# Patient Record
Sex: Female | Born: 1945 | Race: Black or African American | Hispanic: No | Smoking: Never smoker
Health system: Southern US, Community
[De-identification: ages and names within clinical notes are randomized; demographics above are authoritative.]

## PROBLEM LIST (undated history)

## (undated) DIAGNOSIS — I10 Essential (primary) hypertension: Secondary | ICD-10-CM

## (undated) DIAGNOSIS — D649 Anemia, unspecified: Secondary | ICD-10-CM

## (undated) DIAGNOSIS — I4891 Unspecified atrial fibrillation: Secondary | ICD-10-CM

## (undated) DIAGNOSIS — J45909 Unspecified asthma, uncomplicated: Secondary | ICD-10-CM

## (undated) DIAGNOSIS — R519 Headache, unspecified: Secondary | ICD-10-CM

## (undated) DIAGNOSIS — K219 Gastro-esophageal reflux disease without esophagitis: Secondary | ICD-10-CM

## (undated) DIAGNOSIS — E7800 Pure hypercholesterolemia, unspecified: Secondary | ICD-10-CM

## (undated) DIAGNOSIS — M1990 Unspecified osteoarthritis, unspecified site: Secondary | ICD-10-CM

## (undated) HISTORY — DX: Unspecified asthma, uncomplicated: J45.909

## (undated) HISTORY — DX: Headache, unspecified: R51.9

---

## 1999-07-13 ENCOUNTER — Ambulatory Visit (HOSPITAL_COMMUNITY): Admission: RE | Admit: 1999-07-13 | Discharge: 1999-07-13 | Payer: Self-pay | Admitting: Family Medicine

## 1999-07-13 ENCOUNTER — Encounter: Payer: Self-pay | Admitting: Family Medicine

## 1999-07-25 ENCOUNTER — Ambulatory Visit (HOSPITAL_COMMUNITY): Admission: RE | Admit: 1999-07-25 | Discharge: 1999-07-25 | Payer: Self-pay

## 2000-06-02 ENCOUNTER — Emergency Department (HOSPITAL_COMMUNITY): Admission: EM | Admit: 2000-06-02 | Discharge: 2000-06-02 | Payer: Self-pay | Admitting: Emergency Medicine

## 2000-06-02 ENCOUNTER — Encounter: Payer: Self-pay | Admitting: Emergency Medicine

## 2001-04-25 ENCOUNTER — Encounter: Payer: Self-pay | Admitting: Family Medicine

## 2001-04-25 ENCOUNTER — Ambulatory Visit (HOSPITAL_COMMUNITY): Admission: RE | Admit: 2001-04-25 | Discharge: 2001-04-25 | Payer: Self-pay | Admitting: Family Medicine

## 2003-12-08 ENCOUNTER — Inpatient Hospital Stay (HOSPITAL_COMMUNITY): Admission: EM | Admit: 2003-12-08 | Discharge: 2003-12-10 | Payer: Self-pay | Admitting: *Deleted

## 2003-12-09 ENCOUNTER — Encounter: Payer: Self-pay | Admitting: Cardiology

## 2004-01-20 ENCOUNTER — Ambulatory Visit: Payer: Self-pay | Admitting: *Deleted

## 2004-01-24 ENCOUNTER — Ambulatory Visit: Payer: Self-pay | Admitting: Family Medicine

## 2004-05-22 ENCOUNTER — Ambulatory Visit: Payer: Self-pay | Admitting: Family Medicine

## 2006-09-30 ENCOUNTER — Ambulatory Visit: Payer: Self-pay | Admitting: Family Medicine

## 2006-12-11 ENCOUNTER — Ambulatory Visit: Payer: Self-pay | Admitting: Family Medicine

## 2007-01-28 ENCOUNTER — Encounter (INDEPENDENT_AMBULATORY_CARE_PROVIDER_SITE_OTHER): Payer: Self-pay | Admitting: *Deleted

## 2007-09-10 ENCOUNTER — Ambulatory Visit: Payer: Self-pay | Admitting: Internal Medicine

## 2007-09-10 LAB — CONVERTED CEMR LAB
ALT: 8 units/L (ref 0–35)
AST: 15 units/L (ref 0–37)
Albumin: 4 g/dL (ref 3.5–5.2)
Alkaline Phosphatase: 42 units/L (ref 39–117)
BUN: 12 mg/dL (ref 6–23)
Basophils Absolute: 0 10*3/uL (ref 0.0–0.1)
Basophils Relative: 0 % (ref 0–1)
CO2: 31 meq/L (ref 19–32)
Calcium: 9.3 mg/dL (ref 8.4–10.5)
Chloride: 97 meq/L (ref 96–112)
Creatinine, Ser: 0.75 mg/dL (ref 0.40–1.20)
Eosinophils Absolute: 0.1 10*3/uL (ref 0.0–0.7)
Eosinophils Relative: 1 % (ref 0–5)
Glucose, Bld: 79 mg/dL (ref 70–99)
HCT: 32.9 % — ABNORMAL LOW (ref 36.0–46.0)
Hemoglobin: 10.7 g/dL — ABNORMAL LOW (ref 12.0–15.0)
Lymphocytes Relative: 53 % — ABNORMAL HIGH (ref 12–46)
Lymphs Abs: 2.4 10*3/uL (ref 0.7–4.0)
MCHC: 32.5 g/dL (ref 30.0–36.0)
MCV: 93.7 fL (ref 78.0–100.0)
Monocytes Absolute: 0.4 10*3/uL (ref 0.1–1.0)
Monocytes Relative: 9 % (ref 3–12)
Neutro Abs: 1.7 10*3/uL (ref 1.7–7.7)
Neutrophils Relative %: 37 % — ABNORMAL LOW (ref 43–77)
Platelets: 181 10*3/uL (ref 150–400)
Potassium: 3.3 meq/L — ABNORMAL LOW (ref 3.5–5.3)
RBC: 3.51 M/uL — ABNORMAL LOW (ref 3.87–5.11)
RDW: 13.4 % (ref 11.5–15.5)
Sodium: 139 meq/L (ref 135–145)
Total Bilirubin: 0.5 mg/dL (ref 0.3–1.2)
Total Protein: 7.9 g/dL (ref 6.0–8.3)
WBC: 4.5 10*3/uL (ref 4.0–10.5)

## 2007-09-22 ENCOUNTER — Encounter: Payer: Self-pay | Admitting: Internal Medicine

## 2007-09-22 ENCOUNTER — Ambulatory Visit: Payer: Self-pay | Admitting: Internal Medicine

## 2007-09-22 LAB — CONVERTED CEMR LAB
Ferritin: 54 ng/mL (ref 10–291)
Iron: 64 ug/dL (ref 42–145)
Saturation Ratios: 19 % — ABNORMAL LOW (ref 20–55)
TIBC: 330 ug/dL (ref 250–470)
UIBC: 266 ug/dL

## 2007-09-29 ENCOUNTER — Ambulatory Visit: Payer: Self-pay | Admitting: Family Medicine

## 2007-10-28 ENCOUNTER — Ambulatory Visit: Payer: Self-pay | Admitting: Family Medicine

## 2007-10-28 LAB — CONVERTED CEMR LAB
BUN: 10 mg/dL (ref 6–23)
CO2: 32 meq/L (ref 19–32)
Calcium: 9.3 mg/dL (ref 8.4–10.5)
Chloride: 100 meq/L (ref 96–112)
Creatinine, Ser: 0.72 mg/dL (ref 0.40–1.20)
Glucose, Bld: 94 mg/dL (ref 70–99)
Potassium: 3.7 meq/L (ref 3.5–5.3)
Sodium: 141 meq/L (ref 135–145)

## 2007-12-31 ENCOUNTER — Ambulatory Visit: Payer: Self-pay | Admitting: Internal Medicine

## 2007-12-31 ENCOUNTER — Encounter (INDEPENDENT_AMBULATORY_CARE_PROVIDER_SITE_OTHER): Payer: Self-pay | Admitting: Family Medicine

## 2007-12-31 LAB — CONVERTED CEMR LAB
Basophils Absolute: 0 10*3/uL (ref 0.0–0.1)
Basophils Relative: 0 % (ref 0–1)
Eosinophils Absolute: 0.1 10*3/uL (ref 0.0–0.7)
Eosinophils Relative: 2 % (ref 0–5)
HCT: 33.6 % — ABNORMAL LOW (ref 36.0–46.0)
Hemoglobin: 11.3 g/dL — ABNORMAL LOW (ref 12.0–15.0)
Lymphocytes Relative: 58 % — ABNORMAL HIGH (ref 12–46)
Lymphs Abs: 2.4 10*3/uL (ref 0.7–4.0)
MCHC: 33.6 g/dL (ref 30.0–36.0)
MCV: 92.1 fL (ref 78.0–100.0)
Monocytes Absolute: 0.3 10*3/uL (ref 0.1–1.0)
Monocytes Relative: 7 % (ref 3–12)
Neutro Abs: 1.3 10*3/uL — ABNORMAL LOW (ref 1.7–7.7)
Neutrophils Relative %: 33 % — ABNORMAL LOW (ref 43–77)
Platelets: 156 10*3/uL (ref 150–400)
RBC: 3.65 M/uL — ABNORMAL LOW (ref 3.87–5.11)
RDW: 12.8 % (ref 11.5–15.5)
WBC: 4 10*3/uL (ref 4.0–10.5)

## 2008-06-09 ENCOUNTER — Emergency Department (HOSPITAL_COMMUNITY): Admission: EM | Admit: 2008-06-09 | Discharge: 2008-06-09 | Payer: Self-pay | Admitting: Emergency Medicine

## 2009-08-10 ENCOUNTER — Emergency Department (HOSPITAL_COMMUNITY): Admission: EM | Admit: 2009-08-10 | Discharge: 2009-08-10 | Payer: Self-pay | Admitting: Family Medicine

## 2010-02-22 ENCOUNTER — Emergency Department (HOSPITAL_COMMUNITY): Admission: EM | Admit: 2010-02-22 | Discharge: 2010-02-22 | Payer: Self-pay | Admitting: Family Medicine

## 2010-03-15 ENCOUNTER — Emergency Department (HOSPITAL_COMMUNITY)
Admission: EM | Admit: 2010-03-15 | Discharge: 2010-03-15 | Payer: Self-pay | Source: Home / Self Care | Admitting: Emergency Medicine

## 2010-05-23 ENCOUNTER — Emergency Department (HOSPITAL_COMMUNITY)
Admission: EM | Admit: 2010-05-23 | Discharge: 2010-05-23 | Payer: Self-pay | Source: Home / Self Care | Admitting: Emergency Medicine

## 2010-05-28 LAB — CBC
HCT: 33.8 % — ABNORMAL LOW (ref 36.0–46.0)
Hemoglobin: 11.1 g/dL — ABNORMAL LOW (ref 12.0–15.0)
MCH: 30.5 pg (ref 26.0–34.0)
MCHC: 32.8 g/dL (ref 30.0–36.0)
MCV: 92.9 fL (ref 78.0–100.0)
Platelets: 138 10*3/uL — ABNORMAL LOW (ref 150–400)
RBC: 3.64 MIL/uL — ABNORMAL LOW (ref 3.87–5.11)
RDW: 13.3 % (ref 11.5–15.5)
WBC: 4.7 10*3/uL (ref 4.0–10.5)

## 2010-05-28 LAB — DIFFERENTIAL
Basophils Absolute: 0 10*3/uL (ref 0.0–0.1)
Basophils Relative: 0 % (ref 0–1)
Eosinophils Absolute: 0.1 10*3/uL (ref 0.0–0.7)
Eosinophils Relative: 2 % (ref 0–5)
Lymphocytes Relative: 52 % — ABNORMAL HIGH (ref 12–46)
Lymphs Abs: 2.5 10*3/uL (ref 0.7–4.0)
Monocytes Absolute: 0.3 10*3/uL (ref 0.1–1.0)
Monocytes Relative: 6 % (ref 3–12)
Neutro Abs: 1.9 10*3/uL (ref 1.7–7.7)
Neutrophils Relative %: 40 % — ABNORMAL LOW (ref 43–77)

## 2010-05-28 LAB — URINALYSIS, ROUTINE W REFLEX MICROSCOPIC
Bilirubin Urine: NEGATIVE
Hgb urine dipstick: NEGATIVE
Ketones, ur: NEGATIVE mg/dL
Nitrite: NEGATIVE
Protein, ur: NEGATIVE mg/dL
Specific Gravity, Urine: 1.009 (ref 1.005–1.030)
Urine Glucose, Fasting: NEGATIVE mg/dL
Urobilinogen, UA: 0.2 mg/dL (ref 0.0–1.0)
pH: 6.5 (ref 5.0–8.0)

## 2010-05-28 LAB — URINE CULTURE
Colony Count: NO GROWTH
Culture  Setup Time: 201201110400
Culture: NO GROWTH

## 2010-05-28 LAB — COMPREHENSIVE METABOLIC PANEL
ALT: 13 U/L (ref 0–35)
AST: 22 U/L (ref 0–37)
Albumin: 3.8 g/dL (ref 3.5–5.2)
Alkaline Phosphatase: 41 U/L (ref 39–117)
BUN: 10 mg/dL (ref 6–23)
CO2: 29 mEq/L (ref 19–32)
Calcium: 9.5 mg/dL (ref 8.4–10.5)
Chloride: 97 mEq/L (ref 96–112)
Creatinine, Ser: 0.75 mg/dL (ref 0.4–1.2)
GFR calc Af Amer: >60 mL/min (ref >60)
GFR calc non Af Amer: >60 mL/min (ref >60)
Glucose, Bld: 104 mg/dL — ABNORMAL HIGH (ref 70–99)
Potassium: 3.7 mEq/L (ref 3.5–5.1)
Sodium: 136 mEq/L (ref 135–145)
Total Bilirubin: 0.6 mg/dL (ref 0.3–1.2)
Total Protein: 7.7 g/dL (ref 6.0–8.3)

## 2010-05-28 LAB — LACTIC ACID, PLASMA: Lactic Acid, Venous: 0.6 mmol/L (ref 0.5–2.2)

## 2010-06-22 ENCOUNTER — Inpatient Hospital Stay (INDEPENDENT_AMBULATORY_CARE_PROVIDER_SITE_OTHER)
Admission: RE | Admit: 2010-06-22 | Discharge: 2010-06-22 | Disposition: A | Discharge status: Home / Self Care | Payer: Self-pay | Source: Ambulatory Visit | Attending: Family Medicine | Admitting: Family Medicine

## 2010-06-22 DIAGNOSIS — I10 Essential (primary) hypertension: Secondary | ICD-10-CM

## 2010-06-24 ENCOUNTER — Emergency Department (HOSPITAL_COMMUNITY)
Admission: EM | Admit: 2010-06-24 | Discharge: 2010-06-24 | Disposition: A | Discharge status: Home / Self Care | Payer: Medicaid Other | Attending: Emergency Medicine | Admitting: Emergency Medicine

## 2010-06-24 DIAGNOSIS — R51 Headache: Secondary | ICD-10-CM | POA: Insufficient documentation

## 2010-06-24 DIAGNOSIS — R1013 Epigastric pain: Secondary | ICD-10-CM | POA: Insufficient documentation

## 2010-06-24 DIAGNOSIS — K8020 Calculus of gallbladder without cholecystitis without obstruction: Secondary | ICD-10-CM | POA: Insufficient documentation

## 2010-06-24 DIAGNOSIS — Z79899 Other long term (current) drug therapy: Secondary | ICD-10-CM | POA: Insufficient documentation

## 2010-06-24 DIAGNOSIS — I10 Essential (primary) hypertension: Secondary | ICD-10-CM | POA: Insufficient documentation

## 2010-06-24 LAB — CBC
HCT: 34.1 % — ABNORMAL LOW (ref 36.0–46.0)
Hemoglobin: 11.3 g/dL — ABNORMAL LOW (ref 12.0–15.0)
MCH: 30.4 pg (ref 26.0–34.0)
MCHC: 33.1 g/dL (ref 30.0–36.0)
MCV: 91.7 fL (ref 78.0–100.0)
Platelets: 164 10*3/uL (ref 150–400)
RBC: 3.72 MIL/uL — ABNORMAL LOW (ref 3.87–5.11)
RDW: 13.1 % (ref 11.5–15.5)
WBC: 4.1 10*3/uL (ref 4.0–10.5)

## 2010-06-24 LAB — DIFFERENTIAL
Basophils Absolute: 0 10*3/uL (ref 0.0–0.1)
Basophils Relative: 0 % (ref 0–1)
Eosinophils Absolute: 0 10*3/uL (ref 0.0–0.7)
Eosinophils Relative: 1 % (ref 0–5)
Lymphocytes Relative: 45 % (ref 12–46)
Lymphs Abs: 1.8 10*3/uL (ref 0.7–4.0)
Monocytes Absolute: 0.2 10*3/uL (ref 0.1–1.0)
Monocytes Relative: 6 % (ref 3–12)
Neutro Abs: 2 10*3/uL (ref 1.7–7.7)
Neutrophils Relative %: 49 % (ref 43–77)

## 2010-06-24 LAB — GLUCOSE, CAPILLARY: Glucose-Capillary: 91 mg/dL (ref 70–99)

## 2010-06-24 LAB — COMPREHENSIVE METABOLIC PANEL
ALT: 12 U/L (ref 0–35)
AST: 19 U/L (ref 0–37)
Albumin: 3.8 g/dL (ref 3.5–5.2)
Alkaline Phosphatase: 39 U/L (ref 39–117)
BUN: 7 mg/dL (ref 6–23)
CO2: 29 mEq/L (ref 19–32)
Calcium: 9.7 mg/dL (ref 8.4–10.5)
Chloride: 104 mEq/L (ref 96–112)
Creatinine, Ser: 0.68 mg/dL (ref 0.4–1.2)
GFR calc Af Amer: >60 mL/min (ref >60)
GFR calc non Af Amer: >60 mL/min (ref >60)
Glucose, Bld: 94 mg/dL (ref 70–99)
Potassium: 4.2 mEq/L (ref 3.5–5.1)
Sodium: 139 mEq/L (ref 135–145)
Total Bilirubin: 0.5 mg/dL (ref 0.3–1.2)
Total Protein: 7.7 g/dL (ref 6.0–8.3)

## 2010-06-24 LAB — URINALYSIS, ROUTINE W REFLEX MICROSCOPIC
Bilirubin Urine: NEGATIVE
Ketones, ur: NEGATIVE mg/dL
Nitrite: NEGATIVE
Urobilinogen, UA: 0.2 mg/dL (ref 0.0–1.0)

## 2010-06-24 LAB — POCT CARDIAC MARKERS
CKMB, poc: <1 ng/mL — ABNORMAL LOW (ref 1.0–8.0)
Myoglobin, poc: 41.5 ng/mL (ref 12–200)
Troponin i, poc: <0.05 ng/mL (ref 0.00–0.09)

## 2010-06-24 LAB — LIPASE, BLOOD: Lipase: 24 U/L (ref 11–59)

## 2010-06-25 LAB — POCT I-STAT, CHEM 8
BUN: 14 mg/dL (ref 6–23)
Chloride: 106 mEq/L (ref 96–112)
Creatinine, Ser: 0.9 mg/dL (ref 0.4–1.2)
Glucose, Bld: 102 mg/dL — ABNORMAL HIGH (ref 70–99)
Potassium: 3.6 mEq/L (ref 3.5–5.1)

## 2010-07-10 ENCOUNTER — Encounter (INDEPENDENT_AMBULATORY_CARE_PROVIDER_SITE_OTHER): Payer: Self-pay | Admitting: Family Medicine

## 2010-07-25 LAB — POCT I-STAT, CHEM 8
Chloride: 101 mEq/L (ref 96–112)
Creatinine, Ser: 1 mg/dL (ref 0.4–1.2)
Glucose, Bld: 90 mg/dL (ref 70–99)
HCT: 36 % (ref 36.0–46.0)
Potassium: 3.6 mEq/L (ref 3.5–5.1)

## 2010-07-27 ENCOUNTER — Emergency Department (HOSPITAL_COMMUNITY): Payer: Medicaid Other

## 2010-07-27 ENCOUNTER — Emergency Department (HOSPITAL_COMMUNITY)
Admission: EM | Admit: 2010-07-27 | Discharge: 2010-07-27 | Disposition: A | Discharge status: Home / Self Care | Payer: Medicaid Other | Attending: Emergency Medicine | Admitting: Emergency Medicine

## 2010-07-27 DIAGNOSIS — R51 Headache: Secondary | ICD-10-CM | POA: Insufficient documentation

## 2010-07-27 DIAGNOSIS — R42 Dizziness and giddiness: Secondary | ICD-10-CM | POA: Insufficient documentation

## 2010-07-27 DIAGNOSIS — I10 Essential (primary) hypertension: Secondary | ICD-10-CM | POA: Insufficient documentation

## 2010-08-30 ENCOUNTER — Inpatient Hospital Stay (INDEPENDENT_AMBULATORY_CARE_PROVIDER_SITE_OTHER)
Admission: RE | Admit: 2010-08-30 | Discharge: 2010-08-30 | Disposition: A | Discharge status: Home / Self Care | Payer: Medicaid Other | Source: Ambulatory Visit | Attending: Family Medicine | Admitting: Family Medicine

## 2010-08-30 ENCOUNTER — Emergency Department (HOSPITAL_COMMUNITY)
Admission: EM | Admit: 2010-08-30 | Discharge: 2010-08-30 | Disposition: A | Discharge status: Home / Self Care | Payer: Medicaid Other | Attending: Emergency Medicine | Admitting: Emergency Medicine

## 2010-08-30 DIAGNOSIS — M7989 Other specified soft tissue disorders: Secondary | ICD-10-CM

## 2010-08-30 DIAGNOSIS — M79609 Pain in unspecified limb: Secondary | ICD-10-CM

## 2010-08-30 DIAGNOSIS — I10 Essential (primary) hypertension: Secondary | ICD-10-CM | POA: Insufficient documentation

## 2010-08-30 LAB — BASIC METABOLIC PANEL
BUN: 10 mg/dL (ref 6–23)
CO2: 33 mEq/L — ABNORMAL HIGH (ref 19–32)
GFR calc non Af Amer: >60 mL/min (ref >60)
Glucose, Bld: 90 mg/dL (ref 70–99)
Potassium: 4.2 mEq/L (ref 3.5–5.1)

## 2010-08-30 LAB — DIFFERENTIAL
Eosinophils Relative: 2 % (ref 0–5)
Lymphocytes Relative: 54 % — ABNORMAL HIGH (ref 12–46)
Lymphs Abs: 2.3 10*3/uL (ref 0.7–4.0)
Monocytes Absolute: 0.4 10*3/uL (ref 0.1–1.0)

## 2010-08-30 LAB — CBC
HCT: 31.7 % — ABNORMAL LOW (ref 36.0–46.0)
MCV: 91.1 fL (ref 78.0–100.0)
RDW: 12.8 % (ref 11.5–15.5)
WBC: 4.4 10*3/uL (ref 4.0–10.5)

## 2010-10-07 ENCOUNTER — Emergency Department (HOSPITAL_COMMUNITY)
Admission: EM | Admit: 2010-10-07 | Discharge: 2010-10-07 | Disposition: A | Discharge status: Home / Self Care | Payer: Medicaid Other | Attending: Emergency Medicine | Admitting: Emergency Medicine

## 2010-10-07 ENCOUNTER — Emergency Department (HOSPITAL_COMMUNITY): Payer: Medicaid Other

## 2010-10-07 DIAGNOSIS — R0789 Other chest pain: Secondary | ICD-10-CM | POA: Insufficient documentation

## 2010-10-07 DIAGNOSIS — I10 Essential (primary) hypertension: Secondary | ICD-10-CM | POA: Insufficient documentation

## 2010-10-07 DIAGNOSIS — R109 Unspecified abdominal pain: Secondary | ICD-10-CM | POA: Insufficient documentation

## 2010-10-07 LAB — URINALYSIS, ROUTINE W REFLEX MICROSCOPIC
Bilirubin Urine: NEGATIVE
Hgb urine dipstick: NEGATIVE
Ketones, ur: NEGATIVE mg/dL
Protein, ur: NEGATIVE mg/dL
Urobilinogen, UA: 0.2 mg/dL (ref 0.0–1.0)

## 2010-10-07 LAB — COMPREHENSIVE METABOLIC PANEL
ALT: 9 U/L (ref 0–35)
AST: 17 U/L (ref 0–37)
Albumin: 3.7 g/dL (ref 3.5–5.2)
CO2: 32 mEq/L (ref 19–32)
Chloride: 97 mEq/L (ref 96–112)
GFR calc Af Amer: >60 mL/min (ref >60)
GFR calc non Af Amer: >60 mL/min (ref >60)
Sodium: 136 mEq/L (ref 135–145)
Total Bilirubin: 0.3 mg/dL (ref 0.3–1.2)

## 2010-10-07 LAB — CBC
HCT: 32.5 % — ABNORMAL LOW (ref 36.0–46.0)
MCH: 31.3 pg (ref 26.0–34.0)
MCHC: 34.2 g/dL (ref 30.0–36.0)
MCV: 91.5 fL (ref 78.0–100.0)
RDW: 12.8 % (ref 11.5–15.5)

## 2010-10-07 LAB — TROPONIN I: Troponin I: <0.3 ng/mL (ref <0.30)

## 2010-10-24 ENCOUNTER — Observation Stay (HOSPITAL_COMMUNITY)
Admission: EM | Admit: 2010-10-24 | Discharge: 2010-10-25 | Disposition: A | Discharge status: Home / Self Care | Payer: Medicaid Other | Attending: Cardiovascular Disease | Admitting: Cardiovascular Disease

## 2010-10-24 ENCOUNTER — Emergency Department (HOSPITAL_COMMUNITY): Payer: Medicaid Other

## 2010-10-24 DIAGNOSIS — Z79899 Other long term (current) drug therapy: Secondary | ICD-10-CM | POA: Insufficient documentation

## 2010-10-24 DIAGNOSIS — R079 Chest pain, unspecified: Principal | ICD-10-CM | POA: Insufficient documentation

## 2010-10-24 DIAGNOSIS — I10 Essential (primary) hypertension: Secondary | ICD-10-CM | POA: Insufficient documentation

## 2010-10-24 LAB — URINALYSIS, ROUTINE W REFLEX MICROSCOPIC
Bilirubin Urine: NEGATIVE
Glucose, UA: NEGATIVE mg/dL
Ketones, ur: NEGATIVE mg/dL
Leukocytes, UA: NEGATIVE
pH: 6.5 (ref 5.0–8.0)

## 2010-10-24 LAB — HEPATIC FUNCTION PANEL
ALT: 9 U/L (ref 0–35)
Total Protein: 6.9 g/dL (ref 6.0–8.3)

## 2010-10-24 LAB — BASIC METABOLIC PANEL
Chloride: 100 mEq/L (ref 96–112)
Creatinine, Ser: 0.74 mg/dL (ref 0.4–1.2)
GFR calc Af Amer: >60 mL/min (ref >60)
Potassium: 3.5 mEq/L (ref 3.5–5.1)

## 2010-10-24 LAB — DIFFERENTIAL
Basophils Absolute: 0 10*3/uL (ref 0.0–0.1)
Basophils Relative: 0 % (ref 0–1)
Eosinophils Relative: 3 % (ref 0–5)
Lymphocytes Relative: 54 % — ABNORMAL HIGH (ref 12–46)
Monocytes Absolute: 0.4 10*3/uL (ref 0.1–1.0)
Monocytes Relative: 7 % (ref 3–12)

## 2010-10-24 LAB — CBC
HCT: 31.6 % — ABNORMAL LOW (ref 36.0–46.0)
Hemoglobin: 10.9 g/dL — ABNORMAL LOW (ref 12.0–15.0)
MCH: 31.3 pg (ref 26.0–34.0)
MCHC: 34.5 g/dL (ref 30.0–36.0)
RDW: 13 % (ref 11.5–15.5)

## 2010-10-24 LAB — PROTIME-INR
INR: 0.96 (ref 0.00–1.49)
Prothrombin Time: 13 seconds (ref 11.6–15.2)

## 2010-10-24 LAB — TROPONIN I
Troponin I: <0.3 ng/mL (ref <0.30)
Troponin I: <0.3 ng/mL (ref <0.30)

## 2010-10-24 LAB — CK TOTAL AND CKMB (NOT AT ARMC)
Relative Index: 1.5 (ref 0.0–2.5)
Total CK: 138 U/L (ref 7–177)

## 2010-10-24 LAB — LIPASE, BLOOD: Lipase: 23 U/L (ref 11–59)

## 2010-10-24 MED ORDER — IOHEXOL 300 MG/ML  SOLN
75.0000 mL | Freq: Once | INTRAMUSCULAR | Status: AC | PRN
  Administered 2010-10-24: 75 mL via INTRAVENOUS

## 2010-10-25 ENCOUNTER — Observation Stay (HOSPITAL_COMMUNITY): Payer: Medicaid Other

## 2010-10-25 LAB — CARDIAC PANEL(CRET KIN+CKTOT+MB+TROPI)
Relative Index: 1.7 (ref 0.0–2.5)
Relative Index: 1.6 (ref 0.0–2.5)
Troponin I: <0.3 ng/mL (ref <0.30)
Troponin I: <0.3 ng/mL (ref <0.30)

## 2010-10-25 LAB — LIPID PANEL
Cholesterol: 191 mg/dL (ref 0–200)
HDL: 90 mg/dL (ref >39)
Triglycerides: 27 mg/dL (ref <150)

## 2010-10-25 MED ORDER — TECHNETIUM TC 99M TETROFOSMIN IV KIT
10.0000 | PACK | Freq: Once | INTRAVENOUS | Status: AC | PRN
  Administered 2010-10-25: 10 via INTRAVENOUS

## 2010-10-25 MED ORDER — TECHNETIUM TC 99M TETROFOSMIN IV KIT
30.0000 | PACK | Freq: Once | INTRAVENOUS | Status: AC | PRN
  Administered 2010-10-25: 30 via INTRAVENOUS

## 2010-10-26 ENCOUNTER — Ambulatory Visit (HOSPITAL_COMMUNITY): Payer: Medicaid Other

## 2011-11-22 ENCOUNTER — Other Ambulatory Visit: Payer: Self-pay | Admitting: Internal Medicine

## 2011-11-22 DIAGNOSIS — Z1231 Encounter for screening mammogram for malignant neoplasm of breast: Secondary | ICD-10-CM

## 2011-12-06 ENCOUNTER — Ambulatory Visit
Admission: RE | Admit: 2011-12-06 | Discharge: 2011-12-06 | Disposition: A | Discharge status: Home / Self Care | Payer: Medicaid Other | Source: Ambulatory Visit | Attending: Internal Medicine | Admitting: Internal Medicine

## 2011-12-06 DIAGNOSIS — Z1231 Encounter for screening mammogram for malignant neoplasm of breast: Secondary | ICD-10-CM

## 2012-04-14 ENCOUNTER — Encounter (HOSPITAL_COMMUNITY): Payer: Self-pay | Admitting: *Deleted

## 2012-04-14 DIAGNOSIS — R079 Chest pain, unspecified: Secondary | ICD-10-CM | POA: Insufficient documentation

## 2012-04-14 DIAGNOSIS — I10 Essential (primary) hypertension: Secondary | ICD-10-CM | POA: Insufficient documentation

## 2012-04-14 DIAGNOSIS — M7989 Other specified soft tissue disorders: Secondary | ICD-10-CM | POA: Insufficient documentation

## 2012-04-14 DIAGNOSIS — Z79899 Other long term (current) drug therapy: Secondary | ICD-10-CM | POA: Insufficient documentation

## 2012-04-14 DIAGNOSIS — Z7982 Long term (current) use of aspirin: Secondary | ICD-10-CM | POA: Insufficient documentation

## 2012-04-14 NOTE — ED Notes (Signed)
Patient with centralized chest pain that started about 4 days ago.  Patient is all across the center of her chest, patient did experience dizziness, and nausea.

## 2012-04-15 ENCOUNTER — Encounter (HOSPITAL_COMMUNITY): Payer: Self-pay | Admitting: *Deleted

## 2012-04-15 ENCOUNTER — Emergency Department (HOSPITAL_COMMUNITY)
Admission: EM | Admit: 2012-04-15 | Discharge: 2012-04-15 | Disposition: A | Discharge status: Home / Self Care | Payer: Medicaid Other | Attending: Emergency Medicine | Admitting: Emergency Medicine

## 2012-04-15 ENCOUNTER — Emergency Department (HOSPITAL_COMMUNITY): Payer: Medicaid Other

## 2012-04-15 DIAGNOSIS — R079 Chest pain, unspecified: Secondary | ICD-10-CM

## 2012-04-15 HISTORY — DX: Essential (primary) hypertension: I10

## 2012-04-15 LAB — CBC
Hemoglobin: 11.4 g/dL — ABNORMAL LOW (ref 12.0–15.0)
RBC: 3.63 MIL/uL — ABNORMAL LOW (ref 3.87–5.11)

## 2012-04-15 LAB — URINALYSIS, ROUTINE W REFLEX MICROSCOPIC
Glucose, UA: NEGATIVE mg/dL
Hgb urine dipstick: NEGATIVE
Specific Gravity, Urine: 1.007 (ref 1.005–1.030)
Urobilinogen, UA: 0.2 mg/dL (ref 0.0–1.0)

## 2012-04-15 LAB — URINE MICROSCOPIC-ADD ON

## 2012-04-15 LAB — POCT I-STAT TROPONIN I: Troponin i, poc: 0.01 ng/mL (ref 0.00–0.08)

## 2012-04-15 LAB — BASIC METABOLIC PANEL
CO2: 30 mEq/L (ref 19–32)
GFR calc non Af Amer: >90 mL/min (ref >90)
Glucose, Bld: 97 mg/dL (ref 70–99)
Potassium: 4.2 mEq/L (ref 3.5–5.1)
Sodium: 137 mEq/L (ref 135–145)

## 2012-04-15 MED ORDER — IOHEXOL 350 MG/ML SOLN
100.0000 mL | Freq: Once | INTRAVENOUS | Status: AC | PRN
  Administered 2012-04-15: 100 mL via INTRAVENOUS

## 2012-04-15 MED ORDER — NAPROXEN 500 MG PO TABS: 500.0000 mg | ORAL_TABLET | Freq: Two times a day (BID) | ORAL | Status: DC

## 2012-04-15 MED ORDER — FAMOTIDINE 20 MG PO TABS: 20.0000 mg | ORAL_TABLET | Freq: Two times a day (BID) | ORAL | Status: DC

## 2012-04-15 MED ORDER — GI COCKTAIL ~~LOC~~
30.0000 mL | Freq: Once | ORAL | Status: AC
  Administered 2012-04-15: 30 mL via ORAL
  Filled 2012-04-15: qty 30

## 2012-04-15 NOTE — ED Notes (Signed)
Patient transported to CT 

## 2012-04-15 NOTE — ED Provider Notes (Signed)
History     CSN: [id number]  Arrival date & time 04/14/12  2353   First MD Initiated Contact with Patient 04/15/12 0043      No chief complaint on file.   (Consider location/radiation/quality/duration/timing/severity/associated sxs/prior treatment) HPI Comments: 66 year old female with a history of hypertension who presents with a complaint of chest pain. She states it started several weeks ago, has been gradual in onset, initially was very mild but over the last 4 days has become much more severe. She is unable to describe what it feels like but states that it is located in her lower sternal area and the upper epigastrium, radiates around both sides of her chest to her back. She has no associated shortness of breath, nausea, diaphoresis, cough, headache, sore throat. She does note that she has chronic swelling of her left lower extremity which she states that her doctors are aware of. She takes 81 mg of aspirin daily  The history is provided by the patient and a relative.    Past Medical History  Diagnosis Date  . Hypertension     History reviewed. No pertinent past surgical history.  No family history on file.  History  Substance Use Topics  . Smoking status: Never Smoker   . Smokeless tobacco: Not on file  . Alcohol Use: No    OB History    Grav Para Term Preterm Abortions TAB SAB Ect Mult Living                  Review of Systems  All other systems reviewed and are negative.    Allergies  Review of patient's allergies indicates no known allergies.  Home Medications   Current Outpatient Rx  Name  Route  Sig  Dispense  Refill  . ACETAMINOPHEN 325 MG PO TABS   Oral   Take 325 mg by mouth every 6 (six) hours as needed. pain         . AMLODIPINE BESYLATE-VALSARTAN 10-320 MG PO TABS   Oral   Take 1 tablet by mouth daily.         . ASPIRIN EC 81 MG PO TBEC   Oral   Take 81 mg by mouth daily.         Marland Kitchen HYDROCHLOROTHIAZIDE 12.5 MG PO CAPS   Oral    Take 12.5 mg by mouth daily.         Marland Kitchen FAMOTIDINE 20 MG PO TABS   Oral   Take 1 tablet (20 mg total) by mouth 2 (two) times daily.   30 tablet   0   . NAPROXEN 500 MG PO TABS   Oral   Take 1 tablet (500 mg total) by mouth 2 (two) times daily with a meal.   30 tablet   0     BP 149/77  Pulse 68  Temp 97.6 F (36.4 C)  Resp 16  SpO2 100%  Physical Exam  Nursing note and vitals reviewed. Constitutional: She appears well-developed and well-nourished. No distress.  HENT:  Head: Normocephalic and atraumatic.  Mouth/Throat: Oropharynx is clear and moist. No oropharyngeal exudate.  Eyes: Conjunctivae normal and EOM are normal. Pupils are equal, round, and reactive to light. Right eye exhibits no discharge. Left eye exhibits no discharge. No scleral icterus.  Neck: Normal range of motion. Neck supple. No JVD present. No thyromegaly present.  Cardiovascular: Normal rate, regular rhythm, normal heart sounds and intact distal pulses.  Exam reveals no gallop and no friction rub.  No murmur heard. Pulmonary/Chest: Effort normal and breath sounds normal. No respiratory distress. She has no wheezes. She has no rales. She exhibits tenderness ( Lower sternal tenderness, bilateral anterior costal margin tenderness).  Abdominal: Soft. Bowel sounds are normal. She exhibits no distension and no mass. There is tenderness.       Epigastric tenderness is mild, no guarding, no peritoneal signs, no pain in McBurney point, no right upper quadrant tenderness  Musculoskeletal: Normal range of motion. She exhibits edema ( 1+ edema to the left lower extremity, there is asymmetry with left greater than right lower extremities). She exhibits no tenderness.  Lymphadenopathy:    She has no cervical adenopathy.  Neurological: She is alert. Coordination normal.  Skin: Skin is warm and dry. No rash noted. No erythema.  Psychiatric: She has a normal mood and affect. Her behavior is normal.    ED Course   Procedures (including critical care time)  Labs Reviewed  CBC - Abnormal; Notable for the following:    RBC 3.63 (*)     Hemoglobin 11.4 (*)     HCT 33.8 (*)     All other components within normal limits  URINALYSIS, ROUTINE W REFLEX MICROSCOPIC - Abnormal; Notable for the following:    Leukocytes, UA SMALL (*)     All other components within normal limits  D-DIMER, QUANTITATIVE - Abnormal; Notable for the following:    D-Dimer, Quant 1.14 (*)     All other components within normal limits  BASIC METABOLIC PANEL  POCT I-STAT TROPONIN I  URINE MICROSCOPIC-ADD ON   Ct Angio Chest Pe W/cm &/or Wo Cm  04/15/2012  *RADIOLOGY REPORT*  Clinical Data: Centralized chest pain, dizziness, nausea and leg swelling.  CT ANGIOGRAPHY CHEST  Technique:  Multidetector CT imaging of the chest using the standard protocol during bolus administration of intravenous contrast. Multiplanar reconstructed images including MIPs were obtained and reviewed to evaluate the vascular anatomy.  Contrast: OMNIPAQUE IOHEXOL 350 MG/ML SOLN  Comparison: Chest radiograph performed 10/07/2010, and CTA of the chest performed 10/24/2010  Findings: There is no evidence of pulmonary embolus.  Minimal bibasilar atelectasis is noted.  The lungs are otherwise clear.  There is no evidence of significant focal consolidation, pleural effusion or pneumothorax.  No masses are identified; no abnormal focal contrast enhancement is seen.  The mediastinum is unremarkable in appearance.  No mediastinal lymphadenopathy is seen.  No pericardial effusion is identified. The great vessels are grossly unremarkable in appearance.  No axillary lymphadenopathy is seen.  The visualized portions of the thyroid gland are unremarkable in appearance.  The visualized portions of the liver and spleen are unremarkable.  No acute osseous abnormalities are seen.  IMPRESSION:  1.  No evidence of pulmonary embolus. 2.  Minimal bibasilar atelectasis noted; lungs  otherwise clear.   Original Report Authenticated By: Tonia Ghent, M.D.      1. Chest pain       MDM  The patient has no risk factors for pulmonary embolism, she exercises daily by walking extensively in the mornings, she has not been traveling, has not been immobilized, injured, recent surgery or on oral contraceptives or other hormone therapy. She has no history of cancer. We'll obtain a d-dimer secondary to the pleuritic nature of her pain which seems to get worse when she takes a deep breath on exam. Otherwise her EKG is unremarkable, labs are normal including a troponin. She states that her pain has been constant for 4 days without any  relief. It is unlikely to be coronary syndrome given this was a normal EKG and a normal troponin.  ED ECG REPORT  I personally interpreted this EKG   Date: 04/15/2012   Rate: 67  Rhythm: normal sinus rhythm  QRS Axis: normal  Intervals: PR prolonged  ST/T Wave abnormalities: normal  Conduction Disutrbances:first-degree A-V block   Narrative Interpretation:   Old EKG Reviewed: none available  Tests negative for PE or ACS - pt appears stable - she states now that it hurts more when she moves around teh bed and changes position.  There is no pericarditis on ecg and she has ttp on the chest wall.  Naprosyn and pepcid for home.  She did have some relief with GI cocktail.       Vida Roller, MD 04/15/12 [id number]

## 2012-04-15 NOTE — ED Notes (Signed)
Pt back from CT

## 2012-05-24 ENCOUNTER — Encounter (HOSPITAL_COMMUNITY): Payer: Self-pay | Admitting: *Deleted

## 2012-05-24 ENCOUNTER — Emergency Department (HOSPITAL_COMMUNITY): Payer: Medicaid Other

## 2012-05-24 ENCOUNTER — Emergency Department (HOSPITAL_COMMUNITY)
Admission: EM | Admit: 2012-05-24 | Discharge: 2012-05-24 | Disposition: A | Discharge status: Home / Self Care | Payer: Medicaid Other | Attending: Emergency Medicine | Admitting: Emergency Medicine

## 2012-05-24 DIAGNOSIS — R079 Chest pain, unspecified: Secondary | ICD-10-CM | POA: Insufficient documentation

## 2012-05-24 DIAGNOSIS — Z79899 Other long term (current) drug therapy: Secondary | ICD-10-CM | POA: Insufficient documentation

## 2012-05-24 DIAGNOSIS — Z7982 Long term (current) use of aspirin: Secondary | ICD-10-CM | POA: Insufficient documentation

## 2012-05-24 DIAGNOSIS — I10 Essential (primary) hypertension: Secondary | ICD-10-CM | POA: Insufficient documentation

## 2012-05-24 LAB — COMPREHENSIVE METABOLIC PANEL
ALT: 8 U/L (ref 0–35)
BUN: 12 mg/dL (ref 6–23)
Calcium: 9.8 mg/dL (ref 8.4–10.5)
Creatinine, Ser: 0.67 mg/dL (ref 0.50–1.10)
GFR calc Af Amer: >90 mL/min (ref >90)
Glucose, Bld: 105 mg/dL — ABNORMAL HIGH (ref 70–99)
Sodium: 137 mEq/L (ref 135–145)
Total Protein: 7.7 g/dL (ref 6.0–8.3)

## 2012-05-24 LAB — TROPONIN I: Troponin I: <0.3 ng/mL (ref <0.30)

## 2012-05-24 LAB — POCT I-STAT TROPONIN I: Troponin i, poc: 0.01 ng/mL (ref 0.00–0.08)

## 2012-05-24 LAB — CBC
Hemoglobin: 11.3 g/dL — ABNORMAL LOW (ref 12.0–15.0)
MCH: 30.7 pg (ref 26.0–34.0)
MCHC: 33.6 g/dL (ref 30.0–36.0)
MCV: 91.3 fL (ref 78.0–100.0)

## 2012-05-24 MED ORDER — FAMOTIDINE 20 MG PO TABS
20.0000 mg | ORAL_TABLET | Freq: Once | ORAL | Status: AC
  Administered 2012-05-24: 20 mg via ORAL
  Filled 2012-05-24: qty 1

## 2012-05-24 MED ORDER — GI COCKTAIL ~~LOC~~
30.0000 mL | Freq: Once | ORAL | Status: AC
  Administered 2012-05-24: 30 mL via ORAL
  Filled 2012-05-24: qty 30

## 2012-05-24 MED ORDER — PANTOPRAZOLE SODIUM 40 MG PO TBEC: 40.0000 mg | DELAYED_RELEASE_TABLET | Freq: Every day | ORAL | Status: DC

## 2012-05-24 MED ORDER — LORAZEPAM 1 MG PO TABS
0.5000 mg | ORAL_TABLET | Freq: Once | ORAL | Status: AC
  Administered 2012-05-24: 0.5 mg via ORAL
  Filled 2012-05-24: qty 1

## 2012-05-24 NOTE — ED Provider Notes (Signed)
History     CSN: [id number]  Arrival date & time 05/24/12  4098   First MD Initiated Contact with Patient 05/24/12 1852      Chief Complaint  Patient presents with  . Chest Pain    (Consider location/radiation/quality/duration/timing/severity/associated sxs/prior treatment) Patient is a 67 y.o. female presenting with chest pain. The history is provided by the patient.  Chest Pain Pertinent negatives for primary symptoms include no fever, no shortness of breath, no cough, no palpitations and no abdominal pain.   pt c/o mid chest pain for the past 3-4 days. States mainly constant, but periods when worse. At rest. No change w activity or exertion. No associated sob, nv or diaphoresis. Anxious. Pain is not pleuritic. No cough or uri symptoms. No fever or chills. No change whether upright or supine. No leg pain or swelling. No personal or family hx cad. No hx dvt or pe. No hx gerd. Non smoker. Hx htn.      Past Medical History  Diagnosis Date  . Hypertension     History reviewed. No pertinent past surgical history.  History reviewed. No pertinent family history.  History  Substance Use Topics  . Smoking status: Never Smoker   . Smokeless tobacco: Not on file  . Alcohol Use: No    OB History    Grav Para Term Preterm Abortions TAB SAB Ect Mult Living                  Review of Systems  Constitutional: Negative for fever and chills.  HENT: Negative for neck pain.   Eyes: Negative for redness.  Respiratory: Negative for cough and shortness of breath.   Cardiovascular: Positive for chest pain. Negative for palpitations and leg swelling.  Gastrointestinal: Negative for abdominal pain.  Genitourinary: Negative for flank pain.  Musculoskeletal: Negative for back pain.  Skin: Negative for rash.  Neurological: Negative for headaches.  Hematological: Does not bruise/bleed easily.  Psychiatric/Behavioral: Negative for confusion.    Allergies  Review of patient's  allergies indicates no known allergies.  Home Medications   Current Outpatient Rx  Name  Route  Sig  Dispense  Refill  . ACETAMINOPHEN 325 MG PO TABS   Oral   Take 325 mg by mouth every 6 (six) hours as needed. pain         . AMLODIPINE BESYLATE-VALSARTAN 10-320 MG PO TABS   Oral   Take 1 tablet by mouth daily.         . ASPIRIN EC 81 MG PO TBEC   Oral   Take 81 mg by mouth daily.         Marland Kitchen HYDROCHLOROTHIAZIDE 12.5 MG PO CAPS   Oral   Take 12.5 mg by mouth daily.         Marland Kitchen NAPROXEN 500 MG PO TABS   Oral   Take 500 mg by mouth 2 (two) times daily as needed. For pain/inflammation           BP 151/72  Pulse 79  Temp 97.4 F (36.3 C)  Resp 23  SpO2 99%  Physical Exam  Nursing note and vitals reviewed. Constitutional: She is oriented to person, place, and time. She appears well-developed and well-nourished. No distress.  HENT:  Nose: Nose normal.  Mouth/Throat: Oropharynx is clear and moist.  Eyes: Conjunctivae normal are normal. No scleral icterus.  Neck: Neck supple. No tracheal deviation present.  Cardiovascular: Normal rate, regular rhythm, normal heart sounds and intact distal pulses.  Exam  reveals no gallop and no friction rub.   No murmur heard. Pulmonary/Chest: Effort normal and breath sounds normal. No respiratory distress. She exhibits tenderness.  Abdominal: Soft. Normal appearance and bowel sounds are normal. She exhibits no distension. There is no tenderness.  Genitourinary:       No cva tenderness  Musculoskeletal: She exhibits no edema and no tenderness.  Neurological: She is alert and oriented to person, place, and time.  Skin: Skin is warm and dry. No rash noted.  Psychiatric:       anxious    ED Course  Procedures (including critical care time)  Results for orders placed during the hospital encounter of 05/24/12  CBC      Component Value Range   WBC 4.2  4.0 - 10.5 K/uL   RBC 3.68 (*) 3.87 - 5.11 MIL/uL   Hemoglobin 11.3 (*) 12.0  - 15.0 g/dL   HCT 40.9 (*) 81.1 - 91.4 %   MCV 91.3  78.0 - 100.0 fL   MCH 30.7  26.0 - 34.0 pg   MCHC 33.6  30.0 - 36.0 g/dL   RDW 78.2  95.6 - 21.3 %   Platelets 147 (*) 150 - 400 K/uL  COMPREHENSIVE METABOLIC PANEL      Component Value Range   Sodium 137  135 - 145 mEq/L   Potassium 4.3  3.5 - 5.1 mEq/L   Chloride 100  96 - 112 mEq/L   CO2 28  19 - 32 mEq/L   Glucose, Bld 105 (*) 70 - 99 mg/dL   BUN 12  6 - 23 mg/dL   Creatinine, Ser 0.86  0.50 - 1.10 mg/dL   Calcium 9.8  8.4 - 57.8 mg/dL   Total Protein 7.7  6.0 - 8.3 g/dL   Albumin 4.0  3.5 - 5.2 g/dL   AST 19  0 - 37 U/L   ALT 8  0 - 35 U/L   Alkaline Phosphatase 47  39 - 117 U/L   Total Bilirubin 0.2 (*) 0.3 - 1.2 mg/dL   GFR calc non Af Amer 90 (*) >90 mL/min   GFR calc Af Amer >90  >90 mL/min  POCT I-STAT TROPONIN I      Component Value Range   Troponin i, poc 0.01  0.00 - 0.08 ng/mL   Comment 3           TROPONIN I      Component Value Range   Troponin I <0.30  <0.30 ng/mL   Dg Chest 2 View  05/24/2012  *RADIOLOGY REPORT*  Clinical Data: Chest pain.  CHEST - 2 VIEW  Comparison: 04/15/2012 and earlier studies  Findings: Tortuous thoracic aorta.  Mild cardiomegaly.  Lungs clear.  No effusion.  Regional bones unremarkable.  IMPRESSION:  1.  No acute disease   Original Report Authenticated By: D. Andria Rhein, MD       MDM  Labs. Cxr.   Date: 05/24/2012  Rate: 88  Rhythm: sinus arrhythmia and premature ventricular contractions (PVC)  QRS Axis: normal  Intervals: normal  ST/T Wave abnormalities: normal  Conduction Disutrbances:none  Narrative Interpretation:   Old EKG Reviewed: changes noted   Reviewed nursing notes and prior charts for additional history.   Reviewed prior charts 6/12, ED eval for similar chest pain.  Workup then included ct angio chest (neg for PE/acute process), abd [place] negative acute, cardiology eval w stress test (negative).  Pt notes feels 'hot' in epigastric/xiphoid area. pepcid and  gi cocktail given  for symptom relief. Ativan .5 mg po for anxiety.   Pt with symptoms x 3-4 days, after which troponin x 2 normal/neg.   Recheck pt, comfortable. Relief w gi meds.   Given atypical recent cp, will refer to card f/u.  Pt appears stable for d/c.       Suzi Roots, MD 05/24/12 2208

## 2012-05-24 NOTE — ED Notes (Signed)
Pt presents for evaluation of chest pain and weakness, pt was seen here for similar symptoms about 2 weeks ago and felt better but now symptoms are back.  States when she stands she feels very weak and needs assistance.  Chest pain is describes as pressure on her chest.  Denies any shortness of breath.  Placed on cardiac monitor.

## 2012-05-24 NOTE — ED Notes (Signed)
Pt reports CP that started after church today.  Reports dizziness, CP that is non-radiating, nausea and SOB.  Pt appears very anxious.  States that she was seen for the same last week with no relief.  Pain increases with deep inspiration.  Pt ambulatory.  Pt reports that she is faint.  Language barrier present.

## 2012-05-24 NOTE — ED Notes (Signed)
Pt verbalized understanding of importance to follow up with cardiology.

## 2012-09-10 IMAGING — CR DG ABDOMEN 2V
2 series · 2 of 2 positions shown · non-contrast
Comparison: CT dated 05/23/2010.

CLINICAL DATA: Lower abdominal pain.

ABDOMEN - 2 VIEW

[w abdomen upright]
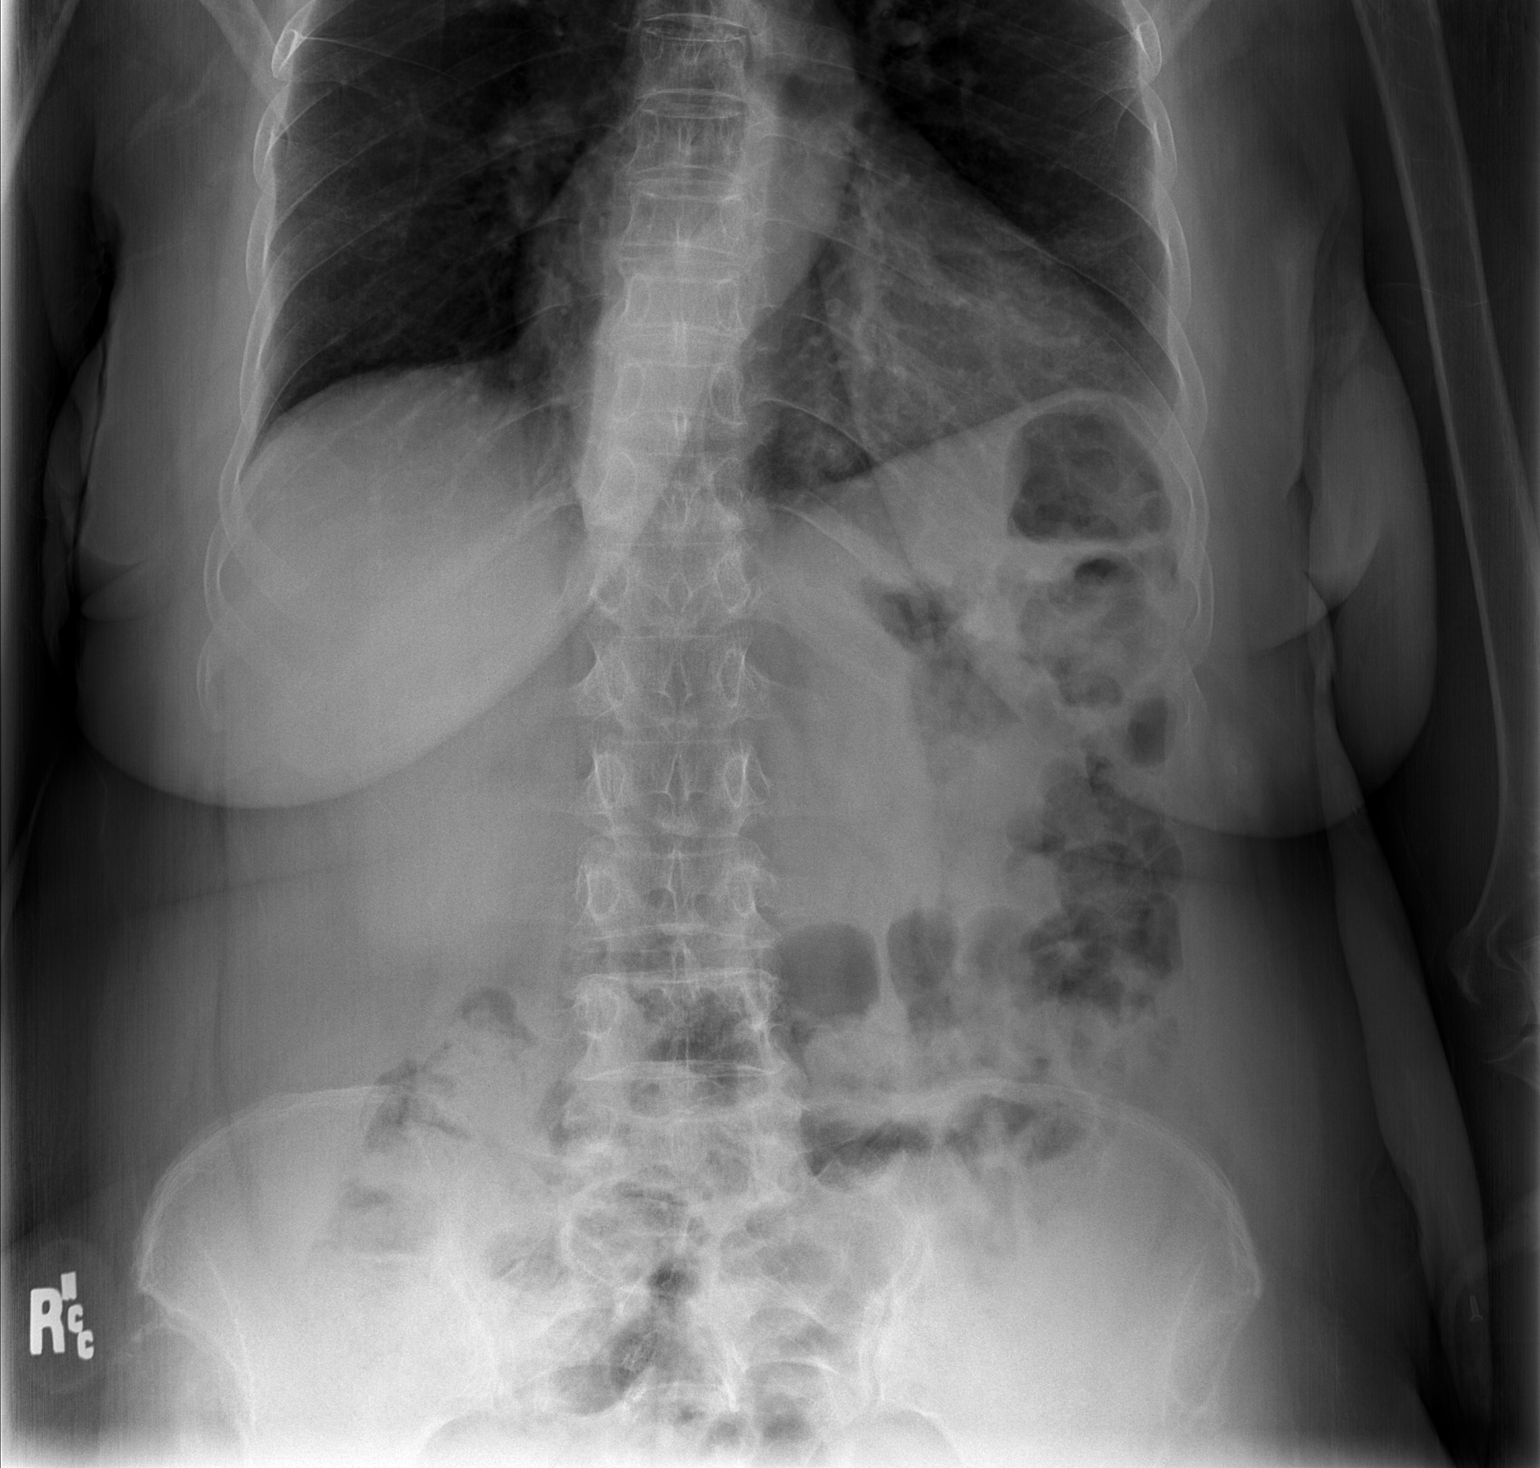

[t abdomen supine *]
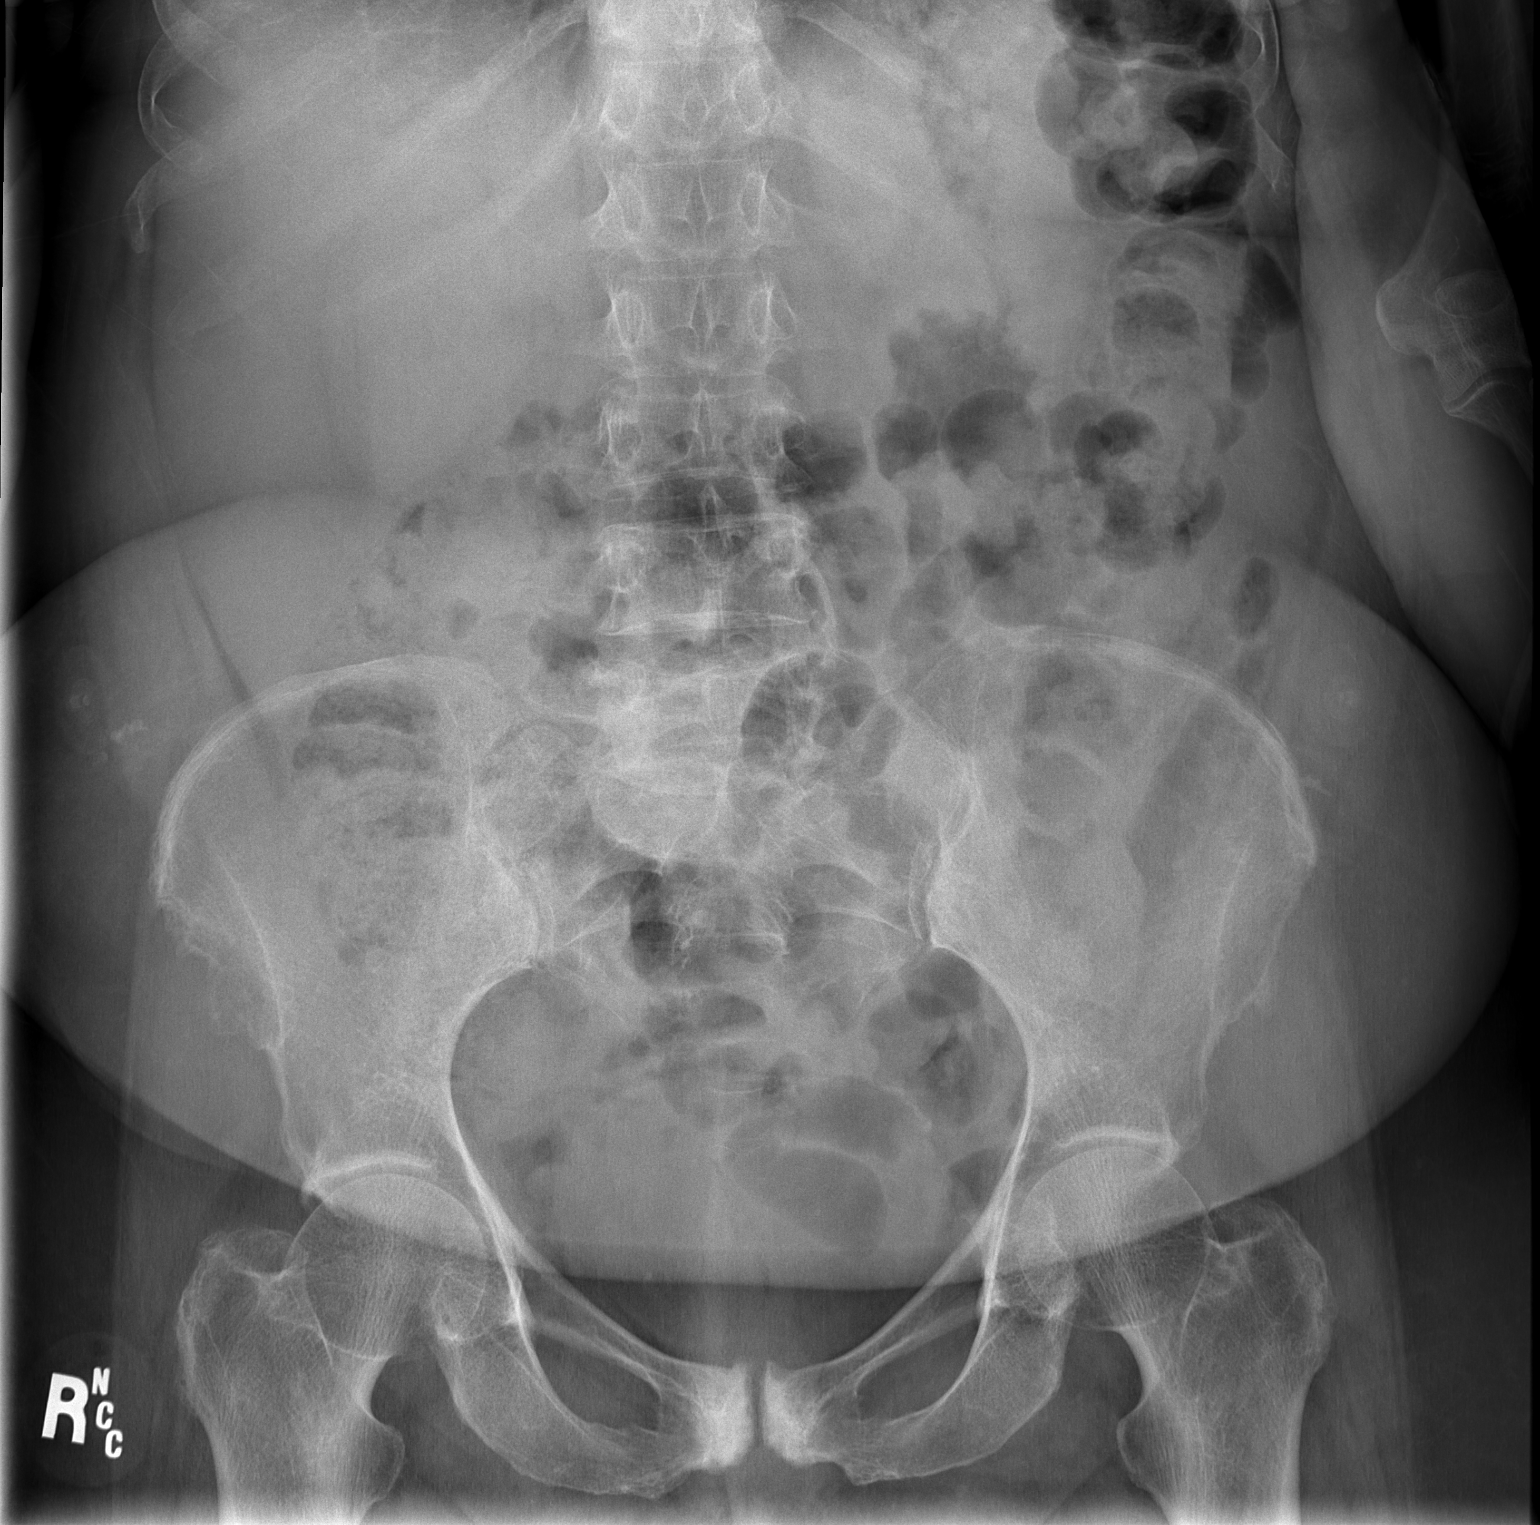

[2 of 2 positions shown; findings below may reference images not displayed]

FINDINGS: Normal bowel gas pattern without free peritoneal air.
Mild scoliosis.  Diffuse osteopenia.  Bilateral osteitis pubis.
IMPRESSION: No acute abnormality.

## 2012-11-30 ENCOUNTER — Other Ambulatory Visit: Payer: Self-pay | Admitting: Internal Medicine

## 2012-11-30 DIAGNOSIS — Z1231 Encounter for screening mammogram for malignant neoplasm of breast: Secondary | ICD-10-CM

## 2012-12-25 ENCOUNTER — Ambulatory Visit: Payer: Medicaid Other

## 2013-01-06 ENCOUNTER — Ambulatory Visit: Payer: Medicaid Other

## 2013-01-08 ENCOUNTER — Ambulatory Visit
Admission: RE | Admit: 2013-01-08 | Discharge: 2013-01-08 | Disposition: A | Discharge status: Home / Self Care | Payer: Medicaid Other | Source: Ambulatory Visit | Attending: Internal Medicine | Admitting: Internal Medicine

## 2013-01-08 DIAGNOSIS — Z1231 Encounter for screening mammogram for malignant neoplasm of breast: Secondary | ICD-10-CM

## 2013-12-03 ENCOUNTER — Other Ambulatory Visit: Payer: Self-pay

## 2013-12-03 DIAGNOSIS — Z1231 Encounter for screening mammogram for malignant neoplasm of breast: Secondary | ICD-10-CM

## 2014-01-10 ENCOUNTER — Ambulatory Visit
Admission: RE | Admit: 2014-01-10 | Discharge: 2014-01-10 | Disposition: A | Discharge status: Home / Self Care | Payer: Medicaid Other | Source: Ambulatory Visit

## 2014-01-10 ENCOUNTER — Encounter (INDEPENDENT_AMBULATORY_CARE_PROVIDER_SITE_OTHER): Payer: Self-pay

## 2014-01-10 DIAGNOSIS — Z1231 Encounter for screening mammogram for malignant neoplasm of breast: Secondary | ICD-10-CM

## 2014-12-29 ENCOUNTER — Other Ambulatory Visit: Payer: Self-pay

## 2014-12-29 DIAGNOSIS — Z1231 Encounter for screening mammogram for malignant neoplasm of breast: Secondary | ICD-10-CM

## 2015-01-12 ENCOUNTER — Ambulatory Visit
Admission: RE | Admit: 2015-01-12 | Discharge: 2015-01-12 | Disposition: A | Discharge status: Home / Self Care | Payer: Medicaid Other | Source: Ambulatory Visit

## 2015-01-12 DIAGNOSIS — Z1231 Encounter for screening mammogram for malignant neoplasm of breast: Secondary | ICD-10-CM

## 2015-03-25 ENCOUNTER — Emergency Department (HOSPITAL_COMMUNITY)
Admission: EM | Admit: 2015-03-25 | Discharge: 2015-03-25 | Disposition: A | Discharge status: Home / Self Care | Payer: Medicaid Other | Attending: Emergency Medicine | Admitting: Emergency Medicine

## 2015-03-25 ENCOUNTER — Emergency Department (EMERGENCY_DEPARTMENT_HOSPITAL)
Admit: 2015-03-25 | Discharge: 2015-03-25 | Disposition: A | Discharge status: Home / Self Care | Payer: Medicaid Other | Attending: Emergency Medicine | Admitting: Emergency Medicine

## 2015-03-25 ENCOUNTER — Emergency Department (HOSPITAL_COMMUNITY): Payer: Medicaid Other

## 2015-03-25 ENCOUNTER — Encounter (HOSPITAL_COMMUNITY): Payer: Self-pay

## 2015-03-25 DIAGNOSIS — M25562 Pain in left knee: Secondary | ICD-10-CM | POA: Diagnosis not present

## 2015-03-25 DIAGNOSIS — M7989 Other specified soft tissue disorders: Secondary | ICD-10-CM

## 2015-03-25 DIAGNOSIS — I10 Essential (primary) hypertension: Secondary | ICD-10-CM | POA: Diagnosis not present

## 2015-03-25 DIAGNOSIS — M79609 Pain in unspecified limb: Secondary | ICD-10-CM | POA: Diagnosis not present

## 2015-03-25 DIAGNOSIS — Z79899 Other long term (current) drug therapy: Secondary | ICD-10-CM | POA: Diagnosis not present

## 2015-03-25 DIAGNOSIS — Z7982 Long term (current) use of aspirin: Secondary | ICD-10-CM | POA: Diagnosis not present

## 2015-03-25 MED ORDER — HYDROCODONE-ACETAMINOPHEN 5-325 MG PO TABS
1.0000 | ORAL_TABLET | Freq: Once | ORAL | Status: AC
  Administered 2015-03-25: 1 via ORAL
  Filled 2015-03-25: qty 1

## 2015-03-25 MED ORDER — HYDROCODONE-ACETAMINOPHEN 5-325 MG PO TABS: 1.0000 | ORAL_TABLET | Freq: Three times a day (TID) | ORAL | Status: DC | PRN

## 2015-03-25 MED ORDER — KETOROLAC TROMETHAMINE 30 MG/ML IJ SOLN
30.0000 mg | Freq: Once | INTRAMUSCULAR | Status: AC
  Administered 2015-03-25: 30 mg via INTRAMUSCULAR
  Filled 2015-03-25: qty 1

## 2015-03-25 MED ORDER — NAPROXEN 250 MG PO TABS: 250.0000 mg | ORAL_TABLET | Freq: Two times a day (BID) | ORAL | Status: DC

## 2015-03-25 NOTE — ED Notes (Signed)
Pt. Coming from home with left-sided leg pain. Pain is making it difficult to walk for pt. Pt. Says pain is behind left knee cap. No hx of fall. Pain came on around 4 days ago and has progressively gotten worse-per pt. Son.

## 2015-03-25 NOTE — ED Notes (Signed)
EDP at bedside  

## 2015-03-25 NOTE — ED Provider Notes (Signed)
CSN: [id number]     Arrival date & time 03/25/15  [id number] History   First MD Initiated Contact with Patient 03/25/15 [phone]     Chief Complaint  Patient presents with  . Leg Pain    The history is provided by the patient. No language interpreter was used.   Ms. Barszcz is a 69 year old woman with history of hypertension that presents for left knee and posterior leg pain. Pain started 4 days ago with no known injury. She reports mild swelling to the leg that is chronic in nature. She has increased pain with range of motion and ambulation. She has not tried any medications at home for the pain. No prior similar symptoms. Symptoms are moderate, constant, worsening. She denies any chest pain, shortness of breath, fevers.  Past Medical History  Diagnosis Date  . Hypertension    History reviewed. No pertinent past surgical history. History reviewed. No pertinent family history. Social History  Substance Use Topics  . Smoking status: Never Smoker   . Smokeless tobacco: None  . Alcohol Use: No   OB History    No data available     Review of Systems  All other systems reviewed and are negative.     Allergies  Review of patient's allergies indicates no known allergies.  Home Medications   Prior to Admission medications   Medication Sig Start Date End Date Taking? Authorizing Provider  acetaminophen (TYLENOL) 325 MG tablet Take 325 mg by mouth every 6 (six) hours as needed. pain   Yes Historical Provider, MD  amLODipine-valsartan (EXFORGE) 10-320 MG per tablet Take 1 tablet by mouth daily.   Yes Historical Provider, MD  aspirin EC 81 MG tablet Take 81 mg by mouth daily.   Yes Historical Provider, MD  atorvastatin (LIPITOR) 20 MG tablet Take 1 tablet by mouth daily. 02/24/15  Yes Historical Provider, MD  hydrochlorothiazide (MICROZIDE) 12.5 MG capsule Take 12.5 mg by mouth daily.   Yes Historical Provider, MD  naproxen (NAPROSYN) 500 MG tablet Take 500 mg by mouth 2 (two) times daily as  needed. For pain/inflammation 04/15/12  Yes Noemi Chapel, MD  pantoprazole (PROTONIX) 40 MG tablet Take 1 tablet (40 mg total) by mouth daily. 05/24/12  Yes Lajean Saver, MD  potassium chloride (MICRO-K) 10 MEQ CR capsule Take 10 mEq by mouth daily. 02/24/15  Yes Historical Provider, MD   BP 145/83 mmHg  Pulse 72  Temp(Src) 97.5 F (36.4 C) (Oral)  Resp 19  Ht 5\' 8"  (1.727 m)  Wt 177 lb 9.6 oz (80.559 kg)  BMI 27.01 kg/m2  SpO2 100% Physical Exam  Constitutional: She is oriented to person, place, and time. She appears well-developed and well-nourished.  HENT:  Head: Normocephalic and atraumatic.  Cardiovascular: Normal rate and regular rhythm.   No murmur heard. Pulmonary/Chest: Effort normal and breath sounds normal. No respiratory distress.  Abdominal: Soft. There is no tenderness. There is no rebound and no guarding.  Musculoskeletal:  1+ pitting edema in the left lower extremity. There is mild diffuse tenderness to the left knee with pain with range of motion. No palpable joint effusion. Pain is predominantly in the posterior knee. No erythema to the knee. 2+ DP pulses bilaterally.  Neurological: She is alert and oriented to person, place, and time.  Skin: Skin is warm and dry.  Psychiatric: She has a normal mood and affect. Her behavior is normal.  Nursing note and vitals reviewed.   ED Course  Procedures (including critical care time) Labs  Review Labs Reviewed - No data to display  Imaging Review Dg Knee Complete 4 Views Left  03/25/2015  CLINICAL DATA:  Left knee pain x 3-4 days, swelling, no known injury EXAM: LEFT KNEE - COMPLETE 4+ VIEW COMPARISON:  None. FINDINGS: No fracture or dislocation is seen. Moderate tricompartmental degenerative changes, most prominent in the medial patellofemoral compartments. Associated medial and lateral compartment chondrocalcinosis. Small suprapatellar knee joint effusion. IMPRESSION: Moderate degenerative changes with chondrocalcinosis.  Small suprapatellar knee joint effusion. Electronically Signed   By: Julian Hy M.D.   On: 03/25/2015 13:00   I have personally reviewed and evaluated these images and lab results as part of my medical decision-making.   EKG Interpretation None      MDM   Final diagnoses:  Left knee pain    Patient here for evaluation of nontraumatic knee pain and left lower extremity swelling. There is no palpable effusion on examination. History and presentation is not consistent with septic arthritis or gouty arthritis. Vascular ultrasound does demonstrate some swelling in the posterior calf. Treating with NSAIDs, pain control, outpatient orthopedics follow-up. Return precautions were discussed.      Quintella Reichert, MD 03/25/15 1733

## 2015-03-25 NOTE — Progress Notes (Signed)
VASCULAR LAB PRELIMINARY  PRELIMINARY  PRELIMINARY  PRELIMINARY  Left lower extremity venous duplex completed.    Preliminary report:  There is no DVT or SVT noted in the left lower extremity.  Interstitial fluid noted throughout left calf. Fluid noted on anterior and lateral aspects of the left knee.  Justice Aguirre, RVT 03/25/2015, 12:11 PM

## 2015-03-25 NOTE — Discharge Instructions (Signed)

## 2015-07-07 ENCOUNTER — Emergency Department (HOSPITAL_COMMUNITY)
Admission: EM | Admit: 2015-07-07 | Discharge: 2015-07-08 | Disposition: A | Discharge status: Home / Self Care | Payer: Medicaid Other | Attending: Emergency Medicine | Admitting: Emergency Medicine

## 2015-07-07 ENCOUNTER — Emergency Department (HOSPITAL_COMMUNITY): Payer: Medicaid Other

## 2015-07-07 ENCOUNTER — Encounter (HOSPITAL_COMMUNITY): Payer: Self-pay | Admitting: *Deleted

## 2015-07-07 DIAGNOSIS — Z791 Long term (current) use of non-steroidal anti-inflammatories (NSAID): Secondary | ICD-10-CM | POA: Insufficient documentation

## 2015-07-07 DIAGNOSIS — R519 Headache, unspecified: Secondary | ICD-10-CM

## 2015-07-07 DIAGNOSIS — R51 Headache: Secondary | ICD-10-CM | POA: Diagnosis present

## 2015-07-07 DIAGNOSIS — Z7982 Long term (current) use of aspirin: Secondary | ICD-10-CM | POA: Diagnosis not present

## 2015-07-07 DIAGNOSIS — I10 Essential (primary) hypertension: Secondary | ICD-10-CM | POA: Diagnosis not present

## 2015-07-07 DIAGNOSIS — R42 Dizziness and giddiness: Secondary | ICD-10-CM | POA: Insufficient documentation

## 2015-07-07 DIAGNOSIS — Z79899 Other long term (current) drug therapy: Secondary | ICD-10-CM | POA: Insufficient documentation

## 2015-07-07 LAB — CBC
HCT: 31.7 % — ABNORMAL LOW (ref 36.0–46.0)
Hemoglobin: 10.6 g/dL — ABNORMAL LOW (ref 12.0–15.0)
MCH: 31.5 pg (ref 26.0–34.0)
MCHC: 33.4 g/dL (ref 30.0–36.0)
MCV: 94.1 fL (ref 78.0–100.0)
PLATELETS: 132 10*3/uL — AB (ref 150–400)
RBC: 3.37 MIL/uL — AB (ref 3.87–5.11)
RDW: 13.1 % (ref 11.5–15.5)
WBC: 4.3 10*3/uL (ref 4.0–10.5)

## 2015-07-07 LAB — URINALYSIS, ROUTINE W REFLEX MICROSCOPIC
Bilirubin Urine: NEGATIVE
GLUCOSE, UA: NEGATIVE mg/dL
Ketones, ur: NEGATIVE mg/dL
NITRITE: NEGATIVE
PROTEIN: NEGATIVE mg/dL
Specific Gravity, Urine: 1.02 (ref 1.005–1.030)
pH: 5.5 (ref 5.0–8.0)

## 2015-07-07 LAB — BASIC METABOLIC PANEL
Anion gap: 10 (ref 5–15)
BUN: 14 mg/dL (ref 6–20)
CO2: 28 mmol/L (ref 22–32)
CREATININE: 0.58 mg/dL (ref 0.44–1.00)
Calcium: 9.3 mg/dL (ref 8.9–10.3)
Chloride: 101 mmol/L (ref 101–111)
GFR calc non Af Amer: >60 mL/min (ref >60)
Glucose, Bld: 91 mg/dL (ref 65–99)
Potassium: 3.8 mmol/L (ref 3.5–5.1)
SODIUM: 139 mmol/L (ref 135–145)

## 2015-07-07 LAB — URINE MICROSCOPIC-ADD ON

## 2015-07-07 LAB — CBG MONITORING, ED: GLUCOSE-CAPILLARY: 89 mg/dL (ref 65–99)

## 2015-07-07 NOTE — ED Notes (Signed)
Pt c/o headache and dizziness x 2 weeks. States she has taken ibuprofen, tylenol with little relief.

## 2015-07-08 LAB — TROPONIN I: Troponin I: <0.03 ng/mL (ref <0.031)

## 2015-07-08 MED ORDER — CLONIDINE HCL 0.1 MG PO TABS
0.1000 mg | ORAL_TABLET | Freq: Once | ORAL | Status: AC
  Administered 2015-07-08: 0.1 mg via ORAL
  Filled 2015-07-08: qty 1

## 2015-07-08 MED ORDER — CLONIDINE HCL 0.2 MG PO TABS: 0.2000 mg | ORAL_TABLET | Freq: Every day | ORAL | Status: DC

## 2015-07-08 MED ORDER — CLONIDINE HCL 0.2 MG PO TABS
0.2000 mg | ORAL_TABLET | Freq: Once | ORAL | Status: AC
  Administered 2015-07-08: 0.2 mg via ORAL
  Filled 2015-07-08: qty 1

## 2015-07-08 NOTE — Discharge Instructions (Signed)
General Headache Without Cause °A headache is pain or discomfort felt around the head or neck area. The specific cause of a headache may not be found. There are many causes and types of headaches. A few common ones are: °· Tension headaches. °· Migraine headaches. °· Cluster headaches. °· Chronic daily headaches. °HOME CARE INSTRUCTIONS  °Watch your condition for any changes. Take these steps to help with your condition: °Managing Pain °· Take over-the-counter and prescription medicines only as told by your health care provider. °· Lie down in a dark, quiet room when you have a headache. °· If directed, apply ice to the head and neck area: °· Put ice in a plastic bag. °· Place a towel between your skin and the bag. °· Leave the ice on for 20 minutes, 2-3 times per day. °· Use a heating pad or hot shower to apply heat to the head and neck area as told by your health care provider. °· Keep lights dim if bright lights bother you or make your headaches worse. °Eating and Drinking °· Eat meals on a regular schedule. °· Limit alcohol use. °· Decrease the amount of caffeine you drink, or stop drinking caffeine. °General Instructions °· Keep all follow-up visits as told by your health care provider. This is important. °· Keep a headache journal to help find out what may trigger your headaches. For example, write down: °· What you eat and drink. °· How much sleep you get. °· Any change to your diet or medicines. °· Try massage or other relaxation techniques. °· Limit stress. °· Sit up straight, and do not tense your muscles. °· Do not use tobacco products, including cigarettes, chewing tobacco, or e-cigarettes. If you need help quitting, ask your health care provider. °· Exercise regularly as told by your health care provider. °· Sleep on a regular schedule. Get 7-9 hours of sleep, or the amount recommended by your health care provider. °SEEK MEDICAL CARE IF:  °· Your symptoms are not helped by medicine. °· You have a  headache that is different from the usual headache. °· You have nausea or you vomit. °· You have a fever. °SEEK IMMEDIATE MEDICAL CARE IF:  °· Your headache becomes severe. °· You have repeated vomiting. °· You have a stiff neck. °· You have a loss of vision. °· You have problems with speech. °· You have pain in the eye or ear. °· You have muscular weakness or loss of muscle control. °· You lose your balance or have trouble walking. °· You feel faint or pass out. °· You have confusion. °  °This information is not intended to replace advice given to you by your health care provider. Make sure you discuss any questions you have with your health care provider. °  °Document Released: 04/29/2005 Document Revised: 01/18/2015 Document Reviewed: 08/22/2014 °Elsevier Interactive Patient Education ©2016 [employer] ° °Hypertension °Hypertension, commonly called high blood pressure, is when the force of blood pumping through your arteries is too strong. Your arteries are the blood vessels that carry blood from your heart throughout your body. A blood pressure reading consists of a higher number over a lower number, such as 110/72. The higher number (systolic) is the pressure inside your arteries when your heart pumps. The lower number (diastolic) is the pressure inside your arteries when your heart relaxes. Ideally you want your blood pressure below 120/80. °Hypertension forces your heart to work harder to pump blood. Your arteries may become narrow or stiff. Having untreated or   uncontrolled hypertension can cause heart attack, stroke, kidney disease, and other problems. °RISK FACTORS °Some risk factors for high blood pressure are controllable. Others are not.  °Risk factors you cannot control include:  °· Race. You may be at higher risk if you are African American. °· Age. Risk increases with age. °· Gender. Men are at higher risk than women before age 45 years. After age 65, women are at higher risk than men. °Risk factors  you can control include: °· Not getting enough exercise or physical activity. °· Being overweight. °· Getting too much fat, sugar, calories, or salt in your diet. °· Drinking too much alcohol. °SIGNS AND SYMPTOMS °Hypertension does not usually cause signs or symptoms. Extremely high blood pressure (hypertensive crisis) may cause headache, anxiety, shortness of breath, and nosebleed. °DIAGNOSIS °To check if you have hypertension, your health care provider will measure your blood pressure while you are seated, with your arm held at the level of your heart. It should be measured at least twice using the same arm. Certain conditions can cause a difference in blood pressure between your right and left arms. A blood pressure reading that is higher than normal on one occasion does not mean that you need treatment. If it is not clear whether you have high blood pressure, you may be asked to return on a different day to have your blood pressure checked again. Or, you may be asked to monitor your blood pressure at home for 1 or more weeks. °TREATMENT °Treating high blood pressure includes making lifestyle changes and possibly taking medicine. Living a healthy lifestyle can help lower high blood pressure. You may need to change some of your habits. °Lifestyle changes may include: °· Following the DASH diet. This diet is high in fruits, vegetables, and whole grains. It is low in salt, red meat, and added sugars. °· Keep your sodium intake below 2,300 mg per day. °· Getting at least 30-45 minutes of aerobic exercise at least 4 times per week. °· Losing weight if necessary. °· Not smoking. °· Limiting alcoholic beverages. °· Learning ways to reduce stress. °Your health care provider may prescribe medicine if lifestyle changes are not enough to get your blood pressure under control, and if one of the following is true: °· You are 18-59 years of age and your systolic blood pressure is above 140. °· You are 60 years of age or older,  and your systolic blood pressure is above 150. °· Your diastolic blood pressure is above 90. °· You have diabetes, and your systolic blood pressure is over 140 or your diastolic blood pressure is over 90. °· You have kidney disease and your blood pressure is above 140/90. °· You have heart disease and your blood pressure is above 140/90. °Your personal target blood pressure may vary depending on your medical conditions, your age, and other factors. °HOME CARE INSTRUCTIONS °· Have your blood pressure rechecked as directed by your health care provider.   °· Take medicines only as directed by your health care provider. Follow the directions carefully. Blood pressure medicines must be taken as prescribed. The medicine does not work as well when you skip doses. Skipping doses also puts you at risk for problems. °· Do not smoke.   °· Monitor your blood pressure at home as directed by your health care provider.  °SEEK MEDICAL CARE IF:  °· You think you are having a reaction to medicines taken. °· You have recurrent headaches or feel dizzy. °· You have swelling in your   ankles. °· You have trouble with your vision. °SEEK IMMEDIATE MEDICAL CARE IF: °· You develop a severe headache or confusion. °· You have unusual weakness, numbness, or feel faint. °· You have severe chest or abdominal pain. °· You vomit repeatedly. °· You have trouble breathing. °MAKE SURE YOU:  °· Understand these instructions. °· Will watch your condition. °· Will get help right away if you are not doing well or get worse. °  °This information is not intended to replace advice given to you by your health care provider. Make sure you discuss any questions you have with your health care provider. °  °Document Released: 04/29/2005 Document Revised: 09/13/2014 Document Reviewed: 02/19/2013 °Elsevier Interactive Patient Education ©2016 [employer] Inc. ° °

## 2015-07-08 NOTE — ED Provider Notes (Signed)
CSN: [id number]     Arrival date & time 07/07/15  2127 History   First MD Initiated Contact with Patient 07/07/15 2329     Chief Complaint  Patient presents with  . Headache  . Dizziness     (Consider location/radiation/quality/duration/timing/severity/associated sxs/prior Treatment) HPI Comments: Patient presents to the emergency department with chief complaint of headache and dizziness. She states that she has had a headache for the past 2 weeks. She has tried taking Tylenol and ibuprofen with minimal relief. Additionally, she reports that she has had some lightheadedness. She states that when she exerts herself, sometimes she feels lightheaded like she is going to pass out. She denies any room is spinning sensation. She denies any chest pain or shortness of breath. There are no other aggravating or relieving factors.  The history is provided by the patient. No language interpreter was used.    Past Medical History  Diagnosis Date  . Hypertension    No past surgical history on file. No family history on file. Social History  Substance Use Topics  . Smoking status: Never Smoker   . Smokeless tobacco: None  . Alcohol Use: No   OB History    No data available     Review of Systems  All other systems reviewed and are negative.     Allergies  Review of patient's allergies indicates no known allergies.  Home Medications   Prior to Admission medications   Medication Sig Start Date End Date Taking? Authorizing Provider  acetaminophen (TYLENOL) 325 MG tablet Take 325 mg by mouth every 6 (six) hours as needed. pain    Historical Provider, MD  amLODipine-valsartan (EXFORGE) 10-320 MG per tablet Take 1 tablet by mouth daily.    Historical Provider, MD  aspirin EC 81 MG tablet Take 81 mg by mouth daily.    Historical Provider, MD  atorvastatin (LIPITOR) 20 MG tablet Take 1 tablet by mouth daily. 02/24/15   Historical Provider, MD  hydrochlorothiazide (MICROZIDE) 12.5 MG capsule  Take 12.5 mg by mouth daily.    Historical Provider, MD  HYDROcodone-acetaminophen (NORCO/VICODIN) 5-325 MG tablet Take 1 tablet by mouth every 8 (eight) hours as needed. 03/25/15   Quintella Reichert, MD  naproxen (NAPROSYN) 250 MG tablet Take 1 tablet (250 mg total) by mouth 2 (two) times daily with a meal. 03/25/15   Quintella Reichert, MD  pantoprazole (PROTONIX) 40 MG tablet Take 1 tablet (40 mg total) by mouth daily. 05/24/12   Lajean Saver, MD  potassium chloride (MICRO-K) 10 MEQ CR capsule Take 10 mEq by mouth daily. 02/24/15   Historical Provider, MD   BP 155/91 mmHg  Pulse 76  Temp(Src) 97.8 F (36.6 C) (Oral)  Resp 20  SpO2 100% Physical Exam  Constitutional: She is oriented to person, place, and time. She appears well-developed and well-nourished.  HENT:  Head: Normocephalic and atraumatic.  Eyes: Conjunctivae and EOM are normal. Pupils are equal, round, and reactive to light.  Neck: Normal range of motion. Neck supple.  Cardiovascular: Normal rate and regular rhythm.  Exam reveals no gallop and no friction rub.   No murmur heard. Pulmonary/Chest: Effort normal and breath sounds normal. No respiratory distress. She has no wheezes. She has no rales. She exhibits no tenderness.  Abdominal: Soft. Bowel sounds are normal. She exhibits no distension and no mass. There is no tenderness. There is no rebound and no guarding.  Musculoskeletal: Normal range of motion. She exhibits no edema or tenderness.  Neurological: She is alert  and oriented to person, place, and time.  Skin: Skin is warm and dry.  Psychiatric: She has a normal mood and affect. Her behavior is normal. Judgment and thought content normal.  Nursing note and vitals reviewed.   ED Course  Procedures (including critical care time) Labs Review Labs Reviewed  CBC - Abnormal; Notable for the following:    RBC 3.37 (*)    Hemoglobin 10.6 (*)    HCT 31.7 (*)    Platelets 132 (*)    All other components within normal limits   URINALYSIS, ROUTINE W REFLEX MICROSCOPIC (NOT AT [facility]) - Abnormal; Notable for the following:    Hgb urine dipstick TRACE (*)    Leukocytes, UA MODERATE (*)    All other components within normal limits  URINE MICROSCOPIC-ADD ON - Abnormal; Notable for the following:    Squamous Epithelial / LPF 0-5 (*)    Bacteria, UA RARE (*)    All other components within normal limits  BASIC METABOLIC PANEL  TROPONIN I  CBG MONITORING, ED    Imaging Review Ct Head Wo Contrast  07/07/2015  CLINICAL DATA:  70 year old presenting with 2 week history of headache and dizziness. EXAM: CT HEAD WITHOUT CONTRAST TECHNIQUE: Contiguous axial images were obtained from the base of the skull through the vertex without intravenous contrast. COMPARISON:  07/27/2010, 12/08/2003. FINDINGS: Ventricular system normal in size and appearance for age. Very mild age-appropriate cortical atrophy, not significantly changed even dating back to 2005. No mass lesion. No midline shift. No acute hemorrhage or hematoma. No extra-axial fluid collections. No evidence of acute infarction. No skull fracture or other focal osseous abnormality involving the skull. Visualized paranasal sinuses, bilateral mastoid air cells and bilateral middle ear cavities well-aerated. Mild bilateral carotid siphon atherosclerosis. IMPRESSION: No acute or significant abnormality for age. Electronically Signed   By: Evangeline Dakin M.D.   On: 07/07/2015 23:49   I have personally reviewed and evaluated these images and lab results as part of my medical decision-making.   EKG Interpretation   Date/Time:  Friday July 07 2015 21:42:46 EST Ventricular Rate:  67 PR Interval:  232 QRS Duration: 84 QT Interval:  410 QTC Calculation: 433 R Axis:   60 Text Interpretation:  Sinus rhythm with 1st degree A-V block Otherwise  normal ECG Confirmed by BEATON  MD, Lamont Tant ([id number]) on 07/08/2015 12:09:11  AM      MDM   Final diagnoses:  Nonintractable headache,  unspecified chronicity pattern, unspecified headache type  Essential hypertension   Patient with headache and dizziness. Blood pressure is poorly controlled. Doubt vertigo, symptoms or not reproducible. There is no room is spinning sensation.  Patient seen by and discussed with Dr. Audie Pinto.  Will attempt for better BP control and reassess headache.  If resolved, then DC to home with catepres to be taken at night with close follow-up.  If still has a headache, check MRI.  1:48 AM Patient states that her headache has nearly resolved. Blood pressure is better controlled. Will discharge per plan as above. Recommend close follow-up with PCP.    Montine Circle, PA-C 07/08/15 0148  Leonard Schwartz, MD 07/10/15 [id number]

## 2015-08-26 ENCOUNTER — Encounter: Payer: Self-pay | Admitting: *Deleted

## 2015-08-26 DIAGNOSIS — Z1211 Encounter for screening for malignant neoplasm of colon: Secondary | ICD-10-CM

## 2015-08-26 NOTE — Congregational Nurse Program (Unsigned)
Congregational Nurse Program Note  Date of Encounter: 08/26/2015  Past Medical History: No past medical history on file.  Encounter Details:     CNP Questionnaire - 08/26/15 1853    Patient Demographics   Is this a new or existing patient? New   Patient is considered a/an Not Applicable   Race African   Patient Assistance   Patient's financial/insurance status Low Income   Uninsured Patient Yes   Interventions Counseled to make appt. with provider;Follow-up/Education/Support provided after completed appt.;Assisted patient in making appt.   Patient referred to apply for the following financial assistance Not Applicable   Food insecurities addressed Referred to food bank or resource   Transportation assistance No   Assistance securing medications No   Educational health offerings Navigating the healthcare system   Encounter Details   Primary purpose of visit Education/Health Concerns   Was an Emergency Department visit averted? Not Applicable   Does patient have a medical provider? No   Patient referred to Establish PCP   Was a mental health screening completed? (GAINS tool) No   Does patient have dental issues? No   Does patient have vision issues? No   Since previous encounter, have you referred patient for abnormal blood pressure that resulted in a new diagnosis or medication change? No   Since previous encounter, have you referred patient for abnormal blood glucose that resulted in a new diagnosis or medication change? No   For Abstraction Use Only   Does patient have insurance? No      Pt visited for screening. Has no insurance, assisted with information on PCP, referred to a free clinic and given community resources. Appointment given for follow up visit.

## 2015-10-28 ENCOUNTER — Emergency Department (HOSPITAL_COMMUNITY): Payer: Medicaid Other

## 2015-10-28 ENCOUNTER — Encounter (HOSPITAL_COMMUNITY): Payer: Self-pay

## 2015-10-28 ENCOUNTER — Emergency Department (HOSPITAL_COMMUNITY)
Admission: EM | Admit: 2015-10-28 | Discharge: 2015-10-28 | Disposition: A | Discharge status: Home / Self Care | Payer: Medicaid Other | Attending: Emergency Medicine | Admitting: Emergency Medicine

## 2015-10-28 ENCOUNTER — Other Ambulatory Visit: Payer: Self-pay

## 2015-10-28 DIAGNOSIS — R51 Headache: Secondary | ICD-10-CM | POA: Diagnosis not present

## 2015-10-28 DIAGNOSIS — Z7982 Long term (current) use of aspirin: Secondary | ICD-10-CM | POA: Insufficient documentation

## 2015-10-28 DIAGNOSIS — R42 Dizziness and giddiness: Secondary | ICD-10-CM | POA: Diagnosis not present

## 2015-10-28 DIAGNOSIS — I10 Essential (primary) hypertension: Secondary | ICD-10-CM | POA: Diagnosis not present

## 2015-10-28 LAB — CBC WITH DIFFERENTIAL/PLATELET
BASOS ABS: 0 10*3/uL (ref 0.0–0.1)
Basophils Relative: 0 %
EOS ABS: 0 10*3/uL (ref 0.0–0.7)
Eosinophils Relative: 1 %
HCT: 32.8 % — ABNORMAL LOW (ref 36.0–46.0)
Hemoglobin: 10.6 g/dL — ABNORMAL LOW (ref 12.0–15.0)
LYMPHS PCT: 57 %
Lymphs Abs: 2.2 10*3/uL (ref 0.7–4.0)
MCH: 30.2 pg (ref 26.0–34.0)
MCHC: 32.3 g/dL (ref 30.0–36.0)
MCV: 93.4 fL (ref 78.0–100.0)
Monocytes Absolute: 0.3 10*3/uL (ref 0.1–1.0)
Monocytes Relative: 7 %
NEUTROS ABS: 1.3 10*3/uL — AB (ref 1.7–7.7)
Neutrophils Relative %: 35 %
Platelets: 95 10*3/uL — ABNORMAL LOW (ref 150–400)
RBC: 3.51 MIL/uL — ABNORMAL LOW (ref 3.87–5.11)
RDW: 13.2 % (ref 11.5–15.5)
WBC: 3.8 10*3/uL — AB (ref 4.0–10.5)

## 2015-10-28 LAB — COMPREHENSIVE METABOLIC PANEL
ALBUMIN: 3.8 g/dL (ref 3.5–5.0)
ALT: 12 U/L — AB (ref 14–54)
AST: 21 U/L (ref 15–41)
Alkaline Phosphatase: 42 U/L (ref 38–126)
Anion gap: 8 (ref 5–15)
BILIRUBIN TOTAL: 0.7 mg/dL (ref 0.3–1.2)
BUN: 10 mg/dL (ref 6–20)
CO2: 27 mmol/L (ref 22–32)
CREATININE: 0.68 mg/dL (ref 0.44–1.00)
Calcium: 9.4 mg/dL (ref 8.9–10.3)
Chloride: 101 mmol/L (ref 101–111)
GFR calc Af Amer: >60 mL/min (ref >60)
GFR calc non Af Amer: >60 mL/min (ref >60)
GLUCOSE: 89 mg/dL (ref 65–99)
POTASSIUM: 4.4 mmol/L (ref 3.5–5.1)
Sodium: 136 mmol/L (ref 135–145)
TOTAL PROTEIN: 7.1 g/dL (ref 6.5–8.1)

## 2015-10-28 LAB — I-STAT TROPONIN, ED: TROPONIN I, POC: 0 ng/mL (ref 0.00–0.08)

## 2015-10-28 MED ORDER — MECLIZINE HCL 12.5 MG PO TABS: 12.5000 mg | ORAL_TABLET | Freq: Three times a day (TID) | ORAL | Status: DC | PRN

## 2015-10-28 MED ORDER — MECLIZINE HCL 25 MG PO TABS
25.0000 mg | ORAL_TABLET | Freq: Once | ORAL | Status: AC
  Administered 2015-10-28: 25 mg via ORAL
  Filled 2015-10-28: qty 1

## 2015-10-28 MED ORDER — SODIUM CHLORIDE 0.9 % IV BOLUS (SEPSIS)
1000.0000 mL | Freq: Once | INTRAVENOUS | Status: AC
  Administered 2015-10-28: 1000 mL via INTRAVENOUS

## 2015-10-28 MED ORDER — METOCLOPRAMIDE HCL 5 MG/ML IJ SOLN
10.0000 mg | Freq: Once | INTRAMUSCULAR | Status: AC
  Administered 2015-10-28: 10 mg via INTRAVENOUS
  Filled 2015-10-28: qty 2

## 2015-10-28 MED ORDER — DIPHENHYDRAMINE HCL 50 MG/ML IJ SOLN
25.0000 mg | Freq: Once | INTRAMUSCULAR | Status: AC
  Administered 2015-10-28: 25 mg via INTRAVENOUS
  Filled 2015-10-28: qty 1

## 2015-10-28 NOTE — ED Notes (Signed)
Patient complains of generalized headache and dizziness for 2 weeks. Saw her MD and they instructed her to take ibuprofen. Alert and oriented. No nausea, denies trauma. No blurred vision.

## 2015-10-28 NOTE — ED Notes (Signed)
Pt ambulates independently and with steady gait at time of discharge. Discharge instructions and follow up information reviewed with patient. No other questions or concerns voiced at this time.  

## 2015-10-28 NOTE — ED Provider Notes (Signed)
CSN: [id number]     Arrival date & time 10/28/15  1627 History   First MD Initiated Contact with Patient 10/28/15 1823     Chief Complaint  Patient presents with  . Headache  . Dizziness     (Consider location/radiation/quality/duration/timing/severity/associated sxs/prior Treatment) The history is provided by the patient.  Morgan Bowers is a 70 y.o. female history of hypertension who presenting with headaches, dizziness, vertigo. Patient states that she has intermittent headaches and dizziness for the last 2 weeks. She states that sometimes the room is spinning when she has post headaches. She states that it is worse when she stands up. She denies passing out or falling. Denies any history of stroke in the past. Her primary care doctor and was told to take ibuprofen which has not helped. She was seen previously with similar symptoms and was diagnosed with dizziness and has no history of stroke in the past.    Past Medical History  Diagnosis Date  . Hypertension    History reviewed. No pertinent past surgical history. No family history on file. Social History  Substance Use Topics  . Smoking status: Never Smoker   . Smokeless tobacco: None  . Alcohol Use: No   OB History    No data available     Review of Systems  Neurological: Positive for dizziness and headaches.  All other systems reviewed and are negative.     Allergies  Review of patient's allergies indicates no known allergies.  Home Medications   Prior to Admission medications   Medication Sig Start Date End Date Taking? Authorizing Provider  acetaminophen (TYLENOL) 325 MG tablet Take 325 mg by mouth every 6 (six) hours as needed. pain   Yes Historical Provider, MD  amLODipine (NORVASC) 5 MG tablet Take 5 mg by mouth 2 (two) times daily. 09/19/15  Yes Historical Provider, MD  aspirin EC 81 MG tablet Take 81 mg by mouth daily.   Yes Historical Provider, MD  atorvastatin (LIPITOR) 20 MG tablet Take 1 tablet by  mouth daily. 02/24/15  Yes Historical Provider, MD  cloNIDine (CATAPRES) 0.2 MG tablet Take 1 tablet (0.2 mg total) by mouth at bedtime. 07/08/15  Yes Montine Circle, PA-C  hydrochlorothiazide (MICROZIDE) 12.5 MG capsule Take 12.5 mg by mouth daily.   Yes Historical Provider, MD  HYDROcodone-acetaminophen (NORCO/VICODIN) 5-325 MG tablet Take 1 tablet by mouth every 8 (eight) hours as needed. 03/25/15  Yes Quintella Reichert, MD  lisinopril (PRINIVIL,ZESTRIL) 40 MG tablet Take 40 mg by mouth daily. 09/19/15  Yes Historical Provider, MD  naproxen (NAPROSYN) 250 MG tablet Take 1 tablet (250 mg total) by mouth 2 (two) times daily with a meal. 03/25/15  Yes Quintella Reichert, MD  pantoprazole (PROTONIX) 40 MG tablet Take 1 tablet (40 mg total) by mouth daily. 05/24/12  Yes Lajean Saver, MD  potassium chloride (MICRO-K) 10 MEQ CR capsule Take 10 mEq by mouth daily. 02/24/15  Yes Historical Provider, MD  RESTASIS 0.05 % ophthalmic emulsion Place 1 drop into both eyes 2 (two) times daily. 09/02/15  Yes Historical Provider, MD   BP 145/88 mmHg  Pulse 69  Temp(Src) 98.1 F (36.7 C) (Oral)  Resp 19  Ht 5\' 6"  (1.676 m)  Wt 182 lb 3.2 oz (82.645 kg)  BMI 29.42 kg/m2  SpO2 98% Physical Exam  Constitutional: She is oriented to person, place, and time. She appears well-developed and well-nourished.  Anxious   HENT:  Head: Normocephalic.  Mouth/Throat: Oropharynx is clear and moist.  Eyes:  Conjunctivae are normal. Pupils are equal, round, and reactive to light.  No obvious nystagmus   Neck: Normal range of motion. Neck supple.  Cardiovascular: Normal rate, regular rhythm and normal heart sounds.   Pulmonary/Chest: Effort normal and breath sounds normal. No respiratory distress. She has no wheezes.  Abdominal: Soft. Bowel sounds are normal. She exhibits no distension. There is no tenderness. There is no rebound.  Musculoskeletal: Normal range of motion. She exhibits no edema or tenderness.  Neurological: She is  alert and oriented to person, place, and time.  CN 2-12 intact. Nl strength throughout. Nl finger to nose. Nl gait. Negative rhomberg   Skin: Skin is warm and dry.  Psychiatric: She has a normal mood and affect. Her behavior is normal. Judgment and thought content normal.  Nursing note and vitals reviewed.   ED Course  Procedures (including critical care time) Labs Review Labs Reviewed  CBC WITH DIFFERENTIAL/PLATELET - Abnormal; Notable for the following:    WBC 3.8 (*)    RBC 3.51 (*)    Hemoglobin 10.6 (*)    HCT 32.8 (*)    Platelets 95 (*)    Neutro Abs 1.3 (*)    All other components within normal limits  COMPREHENSIVE METABOLIC PANEL - Abnormal; Notable for the following:    ALT 12 (*)    All other components within normal limits  I-STAT TROPOININ, ED    Imaging Review Ct Head Wo Contrast  10/28/2015  CLINICAL DATA:  Headache and dizziness for 2 weeks. EXAM: CT HEAD WITHOUT CONTRAST TECHNIQUE: Contiguous axial images were obtained from the base of the skull through the vertex without intravenous contrast. COMPARISON:  Head CT 07/07/2015 FINDINGS: Brain: No evidence of acute infarction, hemorrhage, extra-axial collection, ventriculomegaly, or mass effect. Mild brain parenchymal atrophy. Vascular: No hyperdense vessel or unexpected calcification. Skull: Negative for fracture or focal lesion. Sinuses/Orbits: No acute findings. Other: None. IMPRESSION: No acute intracranial abnormality. Electronically Signed   By: Fidela Salisbury M.D.   On: 10/28/2015 20:04   I have personally reviewed and evaluated these images and lab results as part of my medical decision-making.   EKG Interpretation None       ED ECG REPORT I, Rehmat Murtagh, the attending physician, personally viewed and interpreted this ECG.   Date: 10/28/2015  EKG Time: 18:56   Rate: 66  Rhythm: normal EKG, normal sinus rhythm  Axis: normal  Intervals:none  ST&T Change: normal    MDM   Final diagnoses:   None   Morgan Bowers is a 70 y.o. female here with headache, dizziness, vertigo for 2 weeks. I think likely complex migraines vs peripheral vertigo. Nl neuro exam and I doubt posterior stroke. Given new onset headaches, will get CT head. Will get labs, orthostatics. Will give migraine cocktail and meclizine.   9:11 PM Labs unremarkable. CT head nl. I think likely peripheral vertigo vs migraines. No headache currently. Doesn't feel dizzy. Will dc home with meclizine prn.    Wandra Arthurs, MD 10/28/15 2112

## 2015-10-28 NOTE — Discharge Instructions (Signed)
Take tylenol, motrin for headaches,  Take meclizine for dizziness.   See your doctor  Return to ER if you have worse dizziness, headaches, passing out

## 2016-02-25 DIAGNOSIS — Q211 Atrial septal defect: Secondary | ICD-10-CM

## 2016-02-25 DIAGNOSIS — Z8774 Personal history of (corrected) congenital malformations of heart and circulatory system: Secondary | ICD-10-CM

## 2016-02-25 DIAGNOSIS — R0600 Dyspnea, unspecified: Secondary | ICD-10-CM | POA: Diagnosis present

## 2016-02-25 NOTE — H&P (Signed)
OFFICE VISIT NOTES COPIED TO EPIC FOR DOCUMENTATION  . History of Present Illness Morgan Frames K. Vyas MD; 03/01/2016 2:41 PM) Patient words: Last O/V 02/07/2015;1 year F/U; ECHO done 02/07/16;pt states she has been dizzy;Orthostatics done today.  The patient is a 70 year old female who presents for a follow-up for Hypertension. Morgan Bowers is a 70 year old African American female. She had atypical chest pain in the past, but, denies any pain now. She gives history of shortness of breath on going up one flight of stairs, unchanged from before. Patient denies any wheezing. No orthopnea or PND. Her walking has been limited because of arthritis. No palpitation. She has dizziness, mostly while standing or walking. It may last from a few minutes up to long time. There is no palpitation, heart speeding, fluttering or irregular heartbeat any time. No history of near-syncope or syncope. Patient has history of varicose veins and chronic swelling on the legs.  Patient has Hypertension, BP has been fairly controlled with therapy. Exforge was discontinued and patient is on lisinopril now. She said that insurance did not pay for the Exforge. No history of diabetes. She has h/o high cholesterol, has been on Atorvastatin. She does not smoke.     Problem Bowers/Past Medical Morgan Bowers; 2016/03/01 12:52 PM) Essential hypertension, benign (I10)  Echo- 01/25/2015 1. Left ventricle cavity is normal in size. Normal global wall motion. Visual EF is 60-65%. Calculated EF 59%. 2. Left atrial cavity is normal in size. 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 3. Right atrial cavity is mildly dilated. 4. Right ventricle cavity is mildly dilated. Normal right ventricular function. 5. Mild mitral regurgitation. 6. Mild tricuspid regurgitation. No evidence of pulmonary hypertension. 7. IVC is dilated with respiratory variation. Chest pain (R07.9)  Lexiscan stress 06/12/12: 1.  Resting EKG NSR, Stress EKG was non diagnostic for ischemia. No ST-T changes of ischemia noted with pharmacologic stress testing. Stress symptoms included light headedness and headache. Stress terminated due to completion of protocol. 2. The perfusion study demonstrated normal isotope uptake both at rest and stress. There was no evidence of ischemia or scar. Dynamic gated images reveal normal wall motion and endocardial thickening. Left ventricular ejection fraction was estimated to be 61%. Mitral regurgitation (I34.0)  Hypercholesterolemia (E78.00)  PFO (patent foramen ovale) (Q21.1)  BMI 30.0-30.9,adult (Z68.30)   Allergies Morgan Bowers; Mar 01, 2016 12:52 PM) No Known Drug Allergies 06/02/2012  Family History Morgan Bowers; 2016/03/01 12:52 PM) Mother  Deceased. doesn't know medical history. Father  Deceased. doesn't know medical history. Sister 1  living-no known heart conditions.  Social History Morgan Bowers; 2016/03/01 12:52 PM) Current tobacco use  Never smoker. Non Drinker/No Alcohol Use  Marital status  Widowed. Living Situation  Lives with daughter. Number of Children  9.  Past Surgical History Morgan Bowers; 2016/03/01 12:52 PM) Cataract Extraction-Bilateral 2013  Medication History Despina Hick, MD; 2016-03-01 2:37 PM) Atorvastatin Calcium (20MG  Tablet, 1 Tablet Oral daily, Taken starting 07/26/2015) Active. Exforge (5-320MG  Tablet, 1 Oral daily) Discontinued. (Insurance did not pay for medicine.) Hydrochlorothiazide (12.5MG  Capsule, 1 Oral daily) Active. Aspirin (81MG  Tablet, 1 Oral daily) Active. Potassium Chloride ER (10MEQ Capsule ER, 1 Oral daily) Active. AmLODIPine Besylate (5MG  Tablet, 1 Oral daily) Active. Lisinopril (40MG  Tablet, 1 Oral daily) Active. Amitriptyline HCl (10MG  Tablet, 1 Oral daily) Active. Medications Reconciled (verbally w/ pt)  Diagnostic Studies History Morgan Bowers; 03-01-16 12:53 PM) Echocardiogram  02/07/2016 Left ventricle cavity is normal in size. Normal  global wall motion. Normal diastolic filling pattern. Calculated EF 63%. Left atrial cavity is normal in size. 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 Interatrial septum bulges to the left suggests elevated Right heart pressure. Mild (Grade I) mitral regurgitation. Mild tricuspid regurgitation. Mild pulmonary hypertension. Pulmonary artery systolic pressure is estimated at 36 mm Hg. IVC is dilated with normal respiratory variation. Suggests elevated right heart pressure. Compared to the study done on 01/25/2015, mild pulmonary hypertension is new. Previously right atrium and right ventricle were reported as borderline enlarged. Nuclear stress test  Lexiscan stress 06/12/12: 1. Resting EKG NSR, Stress EKG was non diagnostic for ischemia. No ST-T changes of ischemia noted with pharmacologic stress testing. Stress symptoms included light headedness and headache. Stress terminated due to completion of protocol. 2. The perfusion study demonstrated normal isotope uptake both at rest and stress. There was no evidence of ischemia or scar. Dynamic gated images reveal normal wall motion and endocardial thickening. Left ventricular ejection fraction was estimated to be 61%.    Review of Systems Morgan Frames K. Vyas MD; 02/19/2016 3:37 PM)  Note: GENERAL- Not feeling tired or fatigue, No fever, chills. CARDIO VASCULAR- No chest pain, Has exertional shortness of breath, No orthopnea or PND. No palpitation, Has dizziness, No fainting. Has h/o hypertension. Has h/o high cholesterol. Chronic swelling on legs due to varicose veins. No claudication in legs, No cramps. No h/o DVT. Has h/o varicose veins. PULMONARY- No cough, phlegm, wheezing, not feeling congested in chest. GASTROINTESTINAL- No abdominal pain, nausea, vomiting or diarrhea. No dark tarry stools. Normal appetite. ENDOCRINE- No Thyroid problem, No feeling of excessive heat or  cold, No polydipsia or polyuria. No Diabetes. MUSCULOSKELETAL- No generalized myalgias or muscle weakness. No joint swelling SKIN- No skin rash, No pruritus HEMATOLOGY- Has h/o mild anemia, No petechiae, excessive bruising, epistaxis, GI bleed or any abnormal bleeding.   Vitals Morgan Frames K. Vyas MD; 02/19/2016 1:21 PM) 02/19/2016 1:08 PM Pulse: 71 (Regular)  P.OX: 99% (Room air) BP: 130/78 (Standing, Left Arm, Standard)    02/19/2016 1:07 PM Pulse: 75 (Regular)  P.OX: 99% (Room air) BP: 124/72 (Sitting, Left Arm, Standard)    02/19/2016 12:55 PM Weight: 183.31 lb Height: 65in Body Surface Area: 1.91 m Body Mass Index: 30.5 kg/m  Pulse: 53 (Regular)  P.OX: 89% (Room air) BP: 118/72 (Supine, Left Arm, Standard)       Physical Exam Morgan Frames K. Vyas MD; 02/19/2016 3:40 PM) The physical exam findings are as follows: Note:GENERAL APPEARANCE- Alert, Oriented. NECK- No JVD. Carotid pulses are 2+, No bruits audible. No thyromegaly. No lymphadenopathy. HEART- Auscultation- Normal S1, S2. S4 gallop at apex. Gr. 1/6 midsystolic murmur is audible at the apex and LPSA. CHEST- Normal shape. Normal percussion. Auscultation- Normal breath sounds, No crepitations. No wheezing. ABDOMEN- Palpation- Soft, Nontender. No hepatosplenomegaly. No masses felt. Auscultation- No bruits audible. EXTREMITIES- No Clubbing or Cyanosis. Chronic nonpitting edema on legs. Pt. has varicosities. PERIPHERAL PULSES- Both dorsalis pedis pulses- 2+, Both posterior tibial pulses- 2+    Assessment & Plan Morgan Frames K. Vyas MD; 02/19/2016 3:42 PM) Essential hypertension, benign (I10) Story: Echocardiogram 02/07/2016: Left ventricle cavity is normal in size. Normal global wall motion. Normal diastolic filling pattern. Calculated EF 63%. Left atrial cavity is normal in size. 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 Interatrial septum bulges to the left suggests elevated  Right heart pressure. Mild (Grade I) mitral regurgitation. Mild tricuspid regurgitation. Mild pulmonary hypertension. Pulmonary artery systolic pressure is estimated at 36  mm Hg. IVC is dilated with normal respiratory variation. Suggests elevated right heart pressure. Compared to the study done on 01/25/2015, mild pulmonary hypertension is new. Previously right atrium and right ventricle were reported as borderline enlarged. Lexiscan stress 06/12/12: 1. Resting EKG NSR, Stress EKG was non diagnostic for ischemia. No ST-T changes of ischemia noted with pharmacologic stress testing. Stress symptoms included light headedness and headache. Stress terminated due to completion of protocol. 2. The perfusion study demonstrated normal isotope uptake both at rest and stress. There was no evidence of ischemia or scar. Dynamic gated images reveal normal wall motion and endocardial thickening. Left ventricular ejection fraction was estimated to be 61%. Current Plans Complete electrocardiogram (93000) PFO (patent foramen ovale) (Q21.1) Mitral regurgitation (I34.0) Impression: Mild Pulmonary hypertension, mild (I27.20) Story: PA systolic Q000111Q mmHg by echocardiogram on 02/07/2016. Hypercholesterolemia (E78.00)  Note:EKG-sinus rhythm, PR interval at upper limits of normal, left atrial abnormality, poor progression of R wave, minor nonspecific T-wave abnormality.   Results of echocardiogram were explained to the patient. As she has mild pulmonary hypertension and PFO, we will get a TEE for further evaluation. Indications, procedure was explained to the patient. She agrees. It will be scheduled at the hospital to be done by Dr. Einar Gip.  Her blood pressure is well controlled. There are no orthostatic changes. She will continue present medications. We will get blood test results from her PCP's office. She was advised to continue low-salt low-cholesterol diet. She was encouraged to folow strict diet to lose  weight. Chronic nonpitting edema of the legs is due to venous insufficiency. Patient was advised to keep the legs elevated while lying down and also wear support hose. I will see her in follow-up after 1 year. I will see her earlier if TEE shows any significant findings.  CC: Dr. Nolene Ebbs.  Signed electronically by Despina Hick, MD (02/19/2016 3:43 PM)

## 2016-02-26 ENCOUNTER — Other Ambulatory Visit: Payer: Self-pay | Admitting: Internal Medicine

## 2016-02-26 DIAGNOSIS — Z1231 Encounter for screening mammogram for malignant neoplasm of breast: Secondary | ICD-10-CM

## 2016-02-29 ENCOUNTER — Ambulatory Visit (HOSPITAL_COMMUNITY)
Admission: RE | Admit: 2016-02-29 | Discharge: 2016-02-29 | Disposition: A | Discharge status: Home / Self Care | Payer: Medicaid Other | Source: Ambulatory Visit | Attending: Cardiology | Admitting: Cardiology

## 2016-02-29 ENCOUNTER — Encounter (HOSPITAL_COMMUNITY): Payer: Self-pay

## 2016-02-29 ENCOUNTER — Encounter (HOSPITAL_COMMUNITY)
Admission: RE | Disposition: A | Discharge status: Home / Self Care | Payer: Self-pay | Source: Ambulatory Visit | Attending: Cardiology

## 2016-02-29 DIAGNOSIS — Z791 Long term (current) use of non-steroidal anti-inflammatories (NSAID): Secondary | ICD-10-CM | POA: Insufficient documentation

## 2016-02-29 DIAGNOSIS — Q211 Atrial septal defect: Secondary | ICD-10-CM | POA: Insufficient documentation

## 2016-02-29 DIAGNOSIS — Z8774 Personal history of (corrected) congenital malformations of heart and circulatory system: Secondary | ICD-10-CM

## 2016-02-29 DIAGNOSIS — Z7982 Long term (current) use of aspirin: Secondary | ICD-10-CM | POA: Diagnosis not present

## 2016-02-29 DIAGNOSIS — R0600 Dyspnea, unspecified: Secondary | ICD-10-CM | POA: Diagnosis present

## 2016-02-29 DIAGNOSIS — Z79899 Other long term (current) drug therapy: Secondary | ICD-10-CM | POA: Diagnosis not present

## 2016-02-29 DIAGNOSIS — E7800 Pure hypercholesterolemia, unspecified: Secondary | ICD-10-CM | POA: Insufficient documentation

## 2016-02-29 DIAGNOSIS — I253 Aneurysm of heart: Secondary | ICD-10-CM | POA: Insufficient documentation

## 2016-02-29 HISTORY — DX: Gastro-esophageal reflux disease without esophagitis: K21.9

## 2016-02-29 HISTORY — PX: TEE WITHOUT CARDIOVERSION: SHX5443

## 2016-02-29 SURGERY — ECHOCARDIOGRAM, TRANSESOPHAGEAL
Anesthesia: Moderate Sedation

## 2016-02-29 MED ORDER — FENTANYL CITRATE (PF) 100 MCG/2ML IJ SOLN
INTRAMUSCULAR | Status: AC
  Filled 2016-02-29: qty 2

## 2016-02-29 MED ORDER — MIDAZOLAM HCL 5 MG/ML IJ SOLN
INTRAMUSCULAR | Status: AC
  Filled 2016-02-29: qty 2

## 2016-02-29 MED ORDER — FENTANYL CITRATE (PF) 100 MCG/2ML IJ SOLN
INTRAMUSCULAR | Status: DC | PRN
  Administered 2016-02-29 (×2): 25 ug via INTRAVENOUS

## 2016-02-29 MED ORDER — MIDAZOLAM HCL 10 MG/2ML IJ SOLN
INTRAMUSCULAR | Status: DC | PRN
  Administered 2016-02-29: 2 mg via INTRAVENOUS
  Administered 2016-02-29: 1 mg via INTRAVENOUS

## 2016-02-29 MED ORDER — BUTAMBEN-TETRACAINE-BENZOCAINE 2-2-14 % EX AERO
INHALATION_SPRAY | CUTANEOUS | Status: DC | PRN
  Administered 2016-02-29: 2 via TOPICAL

## 2016-02-29 MED ORDER — SODIUM CHLORIDE 0.9 % IV SOLN: INTRAVENOUS | Status: DC

## 2016-02-29 NOTE — CV Procedure (Signed)
TEE: Under moderate sedation, TEE was performed without complications: LV: Normal. Normal EF. RV: Normal LA: Normal. Left atrial appendage: Normal without thrombus. Normal function. Eustachian valve noted. 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  Moderate sized PFO with left to right shunting by color Doppler noted with long tunnel.  Double contrast study strongly positive for right to left shunting with Valsalva strain.  RA: Normal  MV: Normal Trace MR. TV: Normal Trace TR. No pulmonary hypertension AV: Normal. No AI or AS. PV: Normal. Trace PI.  Thoracic and ascending aorta: Normal without significant plaque or atheromatous changes.  Conscious sedation protocol was followed, I personally administered conscious sedation and monitored the patient. Patient received 3 milligrams of Versed and 50 mcg fenntanyl . Patient tolerated the procedure well and there was no complication from conscious sedation. Time administered was 11 minutes.

## 2016-02-29 NOTE — Interval H&P Note (Signed)
History and Physical Interval Note:  02/29/2016 8:08 AM  Morgan Bowers  has presented today for surgery, with the diagnosis of PFO, PULMONARY HYPERTENSION  The various methods of treatment have been discussed with the patient and family. After consideration of risks, benefits and other options for treatment, the patient has consented to  Procedure(s): TRANSESOPHAGEAL ECHOCARDIOGRAM (TEE) (N/A) as a surgical intervention .  The patient's history has been reviewed, patient examined, no change in status, stable for surgery.  I have reviewed the patient's chart and labs.  Questions were answered to the patient's satisfaction.     [name]

## 2016-02-29 NOTE — Discharge Instructions (Signed)

## 2016-02-29 NOTE — Progress Notes (Signed)
Echocardiogram Echocardiogram Transesophageal has been performed.  Morgan Bowers 02/29/2016, 8:39 AM

## 2016-03-01 ENCOUNTER — Encounter (HOSPITAL_COMMUNITY): Payer: Self-pay | Admitting: Cardiology

## 2016-03-02 LAB — ECHO TEE
Reg peak vel: 232 cm/s
TRMAXVEL: 232 cm/s

## 2016-03-14 ENCOUNTER — Ambulatory Visit
Admission: RE | Admit: 2016-03-14 | Discharge: 2016-03-14 | Disposition: A | Discharge status: Home / Self Care | Payer: Medicaid Other | Source: Ambulatory Visit | Attending: Internal Medicine | Admitting: Internal Medicine

## 2016-03-14 DIAGNOSIS — Z1231 Encounter for screening mammogram for malignant neoplasm of breast: Secondary | ICD-10-CM

## 2016-05-19 ENCOUNTER — Encounter (HOSPITAL_COMMUNITY): Payer: Self-pay

## 2016-05-19 ENCOUNTER — Emergency Department (HOSPITAL_COMMUNITY): Payer: Medicaid Other

## 2016-05-19 ENCOUNTER — Emergency Department (HOSPITAL_COMMUNITY)
Admission: EM | Admit: 2016-05-19 | Discharge: 2016-05-19 | Disposition: A | Discharge status: Home / Self Care | Payer: Medicaid Other | Attending: Emergency Medicine | Admitting: Emergency Medicine

## 2016-05-19 DIAGNOSIS — M545 Low back pain, unspecified: Secondary | ICD-10-CM

## 2016-05-19 DIAGNOSIS — I10 Essential (primary) hypertension: Secondary | ICD-10-CM | POA: Diagnosis not present

## 2016-05-19 DIAGNOSIS — R109 Unspecified abdominal pain: Secondary | ICD-10-CM | POA: Diagnosis present

## 2016-05-19 DIAGNOSIS — I4891 Unspecified atrial fibrillation: Secondary | ICD-10-CM

## 2016-05-19 DIAGNOSIS — Z79899 Other long term (current) drug therapy: Secondary | ICD-10-CM | POA: Diagnosis not present

## 2016-05-19 DIAGNOSIS — Z7982 Long term (current) use of aspirin: Secondary | ICD-10-CM | POA: Insufficient documentation

## 2016-05-19 DIAGNOSIS — R1011 Right upper quadrant pain: Secondary | ICD-10-CM

## 2016-05-19 LAB — I-STAT TROPONIN, ED: Troponin i, poc: 0.03 ng/mL (ref 0.00–0.08)

## 2016-05-19 LAB — URINALYSIS, ROUTINE W REFLEX MICROSCOPIC
BILIRUBIN URINE: NEGATIVE
Glucose, UA: NEGATIVE mg/dL
HGB URINE DIPSTICK: NEGATIVE
Ketones, ur: NEGATIVE mg/dL
Leukocytes, UA: NEGATIVE
NITRITE: NEGATIVE
PROTEIN: NEGATIVE mg/dL
SPECIFIC GRAVITY, URINE: 1.003 — AB (ref 1.005–1.030)
pH: 6 (ref 5.0–8.0)

## 2016-05-19 LAB — COMPREHENSIVE METABOLIC PANEL
ALK PHOS: 39 U/L (ref 38–126)
ALT: 11 U/L — AB (ref 14–54)
AST: 18 U/L (ref 15–41)
Albumin: 3.7 g/dL (ref 3.5–5.0)
Anion gap: 6 (ref 5–15)
BILIRUBIN TOTAL: 0.7 mg/dL (ref 0.3–1.2)
BUN: 13 mg/dL (ref 6–20)
CALCIUM: 9.3 mg/dL (ref 8.9–10.3)
CO2: 31 mmol/L (ref 22–32)
CREATININE: 0.82 mg/dL (ref 0.44–1.00)
Chloride: 101 mmol/L (ref 101–111)
GFR calc non Af Amer: >60 mL/min (ref >60)
GLUCOSE: 108 mg/dL — AB (ref 65–99)
Potassium: 3.8 mmol/L (ref 3.5–5.1)
Sodium: 138 mmol/L (ref 135–145)
TOTAL PROTEIN: 6.5 g/dL (ref 6.5–8.1)

## 2016-05-19 LAB — CBC
HCT: 33.2 % — ABNORMAL LOW (ref 36.0–46.0)
HEMOGLOBIN: 10.9 g/dL — AB (ref 12.0–15.0)
MCH: 30.8 pg (ref 26.0–34.0)
MCHC: 32.8 g/dL (ref 30.0–36.0)
MCV: 93.8 fL (ref 78.0–100.0)
PLATELETS: 143 10*3/uL — AB (ref 150–400)
RBC: 3.54 MIL/uL — AB (ref 3.87–5.11)
RDW: 13 % (ref 11.5–15.5)
WBC: 4.2 10*3/uL (ref 4.0–10.5)

## 2016-05-19 LAB — LIPASE, BLOOD: Lipase: 21 U/L (ref 11–51)

## 2016-05-19 MED ORDER — FENTANYL CITRATE (PF) 100 MCG/2ML IJ SOLN
25.0000 ug | Freq: Once | INTRAMUSCULAR | Status: AC
  Administered 2016-05-19: 25 ug via INTRAVENOUS
  Filled 2016-05-19: qty 2

## 2016-05-19 MED ORDER — RIVAROXABAN 20 MG PO TABS: 20.0000 mg | ORAL_TABLET | Freq: Every day | ORAL | 2 refills | Status: DC

## 2016-05-19 MED ORDER — HYDROCODONE-ACETAMINOPHEN 5-325 MG PO TABS: 1.0000 | ORAL_TABLET | ORAL | 0 refills | Status: DC | PRN

## 2016-05-19 MED ORDER — IOPAMIDOL (ISOVUE-300) INJECTION 61%
INTRAVENOUS | Status: AC
  Administered 2016-05-19: 100 mL
  Filled 2016-05-19: qty 100

## 2016-05-19 NOTE — ED Provider Notes (Addendum)
Patient signed out to me. Labs and x-rays reviewed and no acute findings. Patient does have new onset atrial fibrillation and I discussed the case with cardiology Dr. Lovena Le, he will contact the atrial fibrillation clinic and the patient will be called tomorrow for follow-up. I counseled the pharmacy and patient started on Xarelto and given teaching. Her neurological examination at this time she has no signs of vascular rise in her lower extremities. Her abdomen is soft. Will place on a strong analgesic and return precautions given   Patient has a CHA?DS?-VASc Score of 3  Lacretia Leigh, MD 05/19/16 1224    Lacretia Leigh, MD 05/19/16 1226

## 2016-05-19 NOTE — ED Triage Notes (Signed)
Pt endorses bilateral flank pain/abd pain x 1 week. Pt saw her pcp earlier this week and was prescribed pain medicine with minimal relief. Pt appears very uncomfortable. VSS. Pt denies n/v/d or urinary sx

## 2016-05-19 NOTE — ED Notes (Signed)
Pt to US.

## 2016-05-19 NOTE — Discharge Instructions (Signed)
The atrial fibrillation clinic will call you tomorrow to schedule a follow-up appointment.

## 2016-05-19 NOTE — ED Provider Notes (Signed)
[facility] DEPT Provider Note   CSN: [id number] Arrival date & time: 05/19/16  0045     History   Chief Complaint Chief Complaint  Patient presents with  . Flank Pain    HPI Morgan Bowers is a 71 y.o. female with a hx of HTN presents to the Emergency Department complaining of gradual, persistent, progressively worsening Bilateral back and flank pain which radiates into the abdomen onset 1 week ago. Patient reports the pain is aching and sharp, rated at a 10/10. She reports seeing her primary care physician and being prescribed Robaxin. She reports this and ibuprofen improved the pain somewhat but the pain returns with severe intensity when they wear off. She denies nausea, vomiting, diarrhea, dysuria, hematuria, vaginal discharge, vaginal bleeding, falls or known injury. No sick contacts. Patient without previous history of similar pain.   The history is provided by the patient and medical records. No language interpreter was used.    Past Medical History:  Diagnosis Date  . Hypertension     There are no active problems to display for this patient.   History reviewed. No pertinent surgical history.  OB History    No data available       Home Medications    Prior to Admission medications   Not on File    Family History History reviewed. No pertinent family history.  Social History Social History  Substance Use Topics  . Smoking status: Never Smoker  . Smokeless tobacco: Never Used  . Alcohol use No     Allergies   Patient has no known allergies.   Review of Systems Review of Systems  Gastrointestinal: Positive for abdominal pain.  Musculoskeletal: Positive for back pain.  All other systems reviewed and are negative.    Physical Exam Updated Vital Signs BP 114/91   Pulse 92   Temp 98.2 F (36.8 C) (Oral)   Resp 22   SpO2 100%   Physical Exam  Constitutional: She appears well-developed and well-nourished. No distress.  Awake, alert,  nontoxic appearance  HENT:  Head: Normocephalic and atraumatic.  Mouth/Throat: Oropharynx is clear and moist. No oropharyngeal exudate.  Eyes: Conjunctivae are normal. No scleral icterus.  Neck: Normal range of motion. Neck supple.  Full ROM without pain  Cardiovascular: Normal rate and intact distal pulses.  An irregularly irregular rhythm present.  Pulses:      Radial pulses are 2+ on the right side, and 2+ on the left side.       Popliteal pulses are 2+ on the right side, and 2+ on the left side.  Pulmonary/Chest: Effort normal and breath sounds normal. No respiratory distress. She has no wheezes.  Equal chest expansion  Abdominal: Soft. Bowel sounds are normal. She exhibits no distension, no pulsatile midline mass and no mass. There is generalized tenderness. There is guarding. There is no rigidity, no rebound and no CVA tenderness.  Generalized abdominal pain with guarding throughout No pulsatile mass No Abdominal distention  Musculoskeletal: Normal range of motion. She exhibits no edema.  Full range of motion of the T-spine and L-spine with pain No midline tenderness to the  T-spine or L-spine Severe tenderness to palpation of the paraspinous muscles of the T-spine and L-spine  Lymphadenopathy:    She has no cervical adenopathy.  Neurological: She is alert.  Speech is clear and goal oriented Moves extremities without ataxia  Skin: Skin is warm and dry. No rash noted. She is not diaphoretic. No erythema.  Psychiatric: She has a  normal mood and affect. Her behavior is normal.  Nursing note and vitals reviewed.    ED Treatments / Results  Labs (all labs ordered are listed, but only abnormal results are displayed) Labs Reviewed  COMPREHENSIVE METABOLIC PANEL - Abnormal; Notable for the following:       Result Value   Glucose, Bld 108 (*)    ALT 11 (*)    All other components within normal limits  CBC - Abnormal; Notable for the following:    RBC 3.54 (*)    Hemoglobin  10.9 (*)    HCT 33.2 (*)    Platelets 143 (*)    All other components within normal limits  URINALYSIS, ROUTINE W REFLEX MICROSCOPIC - Abnormal; Notable for the following:    Color, Urine COLORLESS (*)    Specific Gravity, Urine 1.003 (*)    All other components within normal limits  LIPASE, BLOOD  I-STAT TROPOININ, ED    EKG  EKG Interpretation  Date/Time:  Sunday May 19 2016 05:46:32 EST Ventricular Rate:  79 PR Interval:    QRS Duration: 83 QT Interval:  384 QTC Calculation: 441 R Axis:   40 Text Interpretation:  Atrial fibrillation Borderline low voltage, extremity leads No previous ECGs available Confirmed by Wyvonnia Dusky  MD, STEPHEN [phone]) on 05/19/2016 5:54:53 AM        Procedures Procedures (including critical care time)  Medications Ordered in ED Medications  fentaNYL (SUBLIMAZE) injection 25 mcg (25 mcg Intravenous Given 05/19/16 0553)  iopamidol (ISOVUE-300) 61 % injection (100 mLs  Contrast Given 05/19/16 0622)     Initial Impression / Assessment and Plan / ED Course  I have reviewed the triage vital signs and the nursing notes.  Pertinent labs & imaging results that were available during my care of the patient were reviewed by me and considered in my medical decision making (see chart for details).  Clinical Course as of May 19 818  Nancy Fetter May 19, 2016  0745 At shift change care was transferred to Vivi Martens, PA-C who will follow pending studies, re-evaulate and determine disposition.   [HM]    Clinical Course User Index [HM] Jarrett Soho Kimeka Badour, PA-C     Pt with persistent back pain and abd pain.  Guarding on exam.  Normal vitals.  ECG with a-fib, new onset, rate controlled.  No hx of same, not anticoagulated. Ct scan and CXR pending.  Pt signed out to oncoming MD, Vivi Martens who will follow CT, re-evaluate and determine disposition.   The patient was discussed with and seen by Dr. Wyvonnia Dusky who agrees with the treatment plan.    Final Clinical  Impressions(s) / ED Diagnoses   Final diagnoses:  Low back pain  RUQ pain    New Prescriptions New Prescriptions   No medications on file     Abigail Butts, PA-C 05/19/16 [place], MD 05/22/16 [phone]

## 2016-05-19 NOTE — ED Notes (Signed)
Pt to xray

## 2016-05-19 NOTE — ED Notes (Signed)
Patient transported to CT 

## 2016-05-20 ENCOUNTER — Encounter (HOSPITAL_COMMUNITY): Payer: Self-pay | Admitting: Cardiology

## 2016-05-20 ENCOUNTER — Telehealth (HOSPITAL_COMMUNITY): Payer: Self-pay | Admitting: *Deleted

## 2016-05-20 NOTE — Telephone Encounter (Signed)
LMOM for pt to clbk to sched f/u with afib clinic

## 2016-05-24 ENCOUNTER — Telehealth (HOSPITAL_COMMUNITY): Payer: Self-pay | Admitting: *Deleted

## 2016-05-24 NOTE — Telephone Encounter (Signed)
Daughter called stated she sees Dr. Einar Gip but had just seen him in December for regular follow up appointment. Encouraged call for follow up be made as it appears Afib was new onset for patient. Daughter verbalized understanding.

## 2016-06-16 ENCOUNTER — Emergency Department (HOSPITAL_COMMUNITY): Payer: Medicaid Other

## 2016-06-16 ENCOUNTER — Inpatient Hospital Stay (HOSPITAL_COMMUNITY)
Admission: EM | Admit: 2016-06-16 | Discharge: 2016-06-20 | DRG: 479 | Disposition: A | Discharge status: Home / Self Care | Payer: Medicaid Other | Attending: Internal Medicine | Admitting: Internal Medicine

## 2016-06-16 ENCOUNTER — Encounter (HOSPITAL_COMMUNITY): Payer: Self-pay

## 2016-06-16 DIAGNOSIS — D150 Benign neoplasm of thymus: Secondary | ICD-10-CM | POA: Diagnosis present

## 2016-06-16 DIAGNOSIS — I4891 Unspecified atrial fibrillation: Secondary | ICD-10-CM | POA: Diagnosis not present

## 2016-06-16 DIAGNOSIS — R071 Chest pain on breathing: Secondary | ICD-10-CM

## 2016-06-16 DIAGNOSIS — S22000A Wedge compression fracture of unspecified thoracic vertebra, initial encounter for closed fracture: Secondary | ICD-10-CM

## 2016-06-16 DIAGNOSIS — R911 Solitary pulmonary nodule: Secondary | ICD-10-CM | POA: Diagnosis present

## 2016-06-16 DIAGNOSIS — M6281 Muscle weakness (generalized): Secondary | ICD-10-CM

## 2016-06-16 DIAGNOSIS — D649 Anemia, unspecified: Secondary | ICD-10-CM | POA: Diagnosis not present

## 2016-06-16 DIAGNOSIS — I10 Essential (primary) hypertension: Secondary | ICD-10-CM | POA: Diagnosis present

## 2016-06-16 DIAGNOSIS — D4989 Neoplasm of unspecified behavior of other specified sites: Secondary | ICD-10-CM | POA: Diagnosis present

## 2016-06-16 DIAGNOSIS — R079 Chest pain, unspecified: Secondary | ICD-10-CM

## 2016-06-16 DIAGNOSIS — Z7901 Long term (current) use of anticoagulants: Secondary | ICD-10-CM

## 2016-06-16 DIAGNOSIS — J40 Bronchitis, not specified as acute or chronic: Secondary | ICD-10-CM | POA: Diagnosis present

## 2016-06-16 DIAGNOSIS — M549 Dorsalgia, unspecified: Secondary | ICD-10-CM

## 2016-06-16 DIAGNOSIS — R52 Pain, unspecified: Secondary | ICD-10-CM

## 2016-06-16 DIAGNOSIS — Z7982 Long term (current) use of aspirin: Secondary | ICD-10-CM

## 2016-06-16 DIAGNOSIS — Z8679 Personal history of other diseases of the circulatory system: Secondary | ICD-10-CM | POA: Diagnosis present

## 2016-06-16 DIAGNOSIS — K5900 Constipation, unspecified: Secondary | ICD-10-CM | POA: Diagnosis not present

## 2016-06-16 DIAGNOSIS — M4854XA Collapsed vertebra, not elsewhere classified, thoracic region, initial encounter for fracture: Principal | ICD-10-CM | POA: Diagnosis present

## 2016-06-16 DIAGNOSIS — Z79899 Other long term (current) drug therapy: Secondary | ICD-10-CM

## 2016-06-16 LAB — URINALYSIS, ROUTINE W REFLEX MICROSCOPIC
BACTERIA UA: NONE SEEN
Bilirubin Urine: NEGATIVE
GLUCOSE, UA: NEGATIVE mg/dL
Hgb urine dipstick: NEGATIVE
KETONES UR: NEGATIVE mg/dL
Nitrite: NEGATIVE
PH: 6 (ref 5.0–8.0)
Protein, ur: NEGATIVE mg/dL

## 2016-06-16 LAB — CBC
HCT: 34.1 % — ABNORMAL LOW (ref 36.0–46.0)
HEMOGLOBIN: 11.2 g/dL — AB (ref 12.0–15.0)
MCH: 31.3 pg (ref 26.0–34.0)
MCHC: 32.8 g/dL (ref 30.0–36.0)
MCV: 95.3 fL (ref 78.0–100.0)
PLATELETS: 153 10*3/uL (ref 150–400)
RBC: 3.58 MIL/uL — ABNORMAL LOW (ref 3.87–5.11)
RDW: 13 % (ref 11.5–15.5)
WBC: 3.9 10*3/uL — ABNORMAL LOW (ref 4.0–10.5)

## 2016-06-16 LAB — I-STAT TROPONIN, ED: TROPONIN I, POC: 0.01 ng/mL (ref 0.00–0.08)

## 2016-06-16 LAB — BASIC METABOLIC PANEL
ANION GAP: 10 (ref 5–15)
BUN: 12 mg/dL (ref 6–20)
CALCIUM: 9.5 mg/dL (ref 8.9–10.3)
CO2: 28 mmol/L (ref 22–32)
CREATININE: 0.83 mg/dL (ref 0.44–1.00)
Chloride: 101 mmol/L (ref 101–111)
GLUCOSE: 81 mg/dL (ref 65–99)
Potassium: 3.6 mmol/L (ref 3.5–5.1)
Sodium: 139 mmol/L (ref 135–145)

## 2016-06-16 MED ORDER — IOPAMIDOL (ISOVUE-370) INJECTION 76%
INTRAVENOUS | Status: AC
Start: 2016-06-16 — End: 2016-06-16
  Administered 2016-06-16: 90 mL
  Filled 2016-06-16: qty 100

## 2016-06-16 MED ORDER — MORPHINE SULFATE (PF) 4 MG/ML IV SOLN
4.0000 mg | Freq: Once | INTRAVENOUS | Status: DC
  Filled 2016-06-16: qty 1

## 2016-06-16 MED ORDER — MORPHINE SULFATE (PF) 4 MG/ML IV SOLN
2.0000 mg | Freq: Once | INTRAVENOUS | Status: DC
  Filled 2016-06-16: qty 1

## 2016-06-16 MED ORDER — FENTANYL CITRATE (PF) 100 MCG/2ML IJ SOLN
50.0000 ug | Freq: Once | INTRAMUSCULAR | Status: AC
  Administered 2016-06-16: 50 ug via INTRAVENOUS
  Filled 2016-06-16: qty 2

## 2016-06-16 MED ORDER — OXYCODONE-ACETAMINOPHEN 5-325 MG PO TABS
1.0000 | ORAL_TABLET | Freq: Once | ORAL | Status: AC
  Administered 2016-06-16: 1 via ORAL
  Filled 2016-06-16: qty 1

## 2016-06-16 MED ORDER — ONDANSETRON HCL 4 MG/2ML IJ SOLN
4.0000 mg | Freq: Once | INTRAMUSCULAR | Status: AC
  Administered 2016-06-16: 4 mg via INTRAVENOUS
  Filled 2016-06-16: qty 2

## 2016-06-16 MED ORDER — SODIUM CHLORIDE 0.9 % IV BOLUS (SEPSIS)
1000.0000 mL | Freq: Once | INTRAVENOUS | Status: AC
  Administered 2016-06-16: 1000 mL via INTRAVENOUS

## 2016-06-16 MED ORDER — SODIUM CHLORIDE 0.9 % IV SOLN
Freq: Once | INTRAVENOUS | Status: AC
  Administered 2016-06-16: 20:00:00 via INTRAVENOUS

## 2016-06-16 NOTE — ED Triage Notes (Signed)
Patient complains of back pain with radiation to chest x 1-2 weeks. States that the pain is worse with movement and inspiration. Appears uncomfortable with any change in position

## 2016-06-16 NOTE — ED Notes (Signed)
Patient transported to CT 

## 2016-06-16 NOTE — ED Provider Notes (Signed)
[facility] DEPT Provider Note   CSN: [id number] Arrival date & time: 06/16/16  1745     History   Chief Complaint Chief Complaint  Patient presents with  . Back Pain  . Chest Pain    HPI Morgan Bowers is a 71 y.o. female.  HPI   71 year old female with past medical history of hypertension who presents with increasingly severe back pain. History is mildly limited due to language but daughter is present to provide interpretation. Patient has reportedly had 2-3 weeks of increasingly severe chest side and back pain. When asked, the pain seems to radiate from her back across her bilateral chest wall. It also occasionally radiates from her back to her chest. She has been unable to sleep lying flat due to severe pain with lying on her back and has had little to no sleep for the last several days. She has seen her primary doctor several times for this and has been prescribed multiple analgesics, including opioids, without significant improvement. The pain has progressively worsened so she presents to the ED for second opinion and further evaluation. Pain is aching, throbbing, and intermittently sharp.  Past Medical History:  Diagnosis Date  . GERD (gastroesophageal reflux disease)   . Hypertension     Patient Active Problem List   Diagnosis Date Noted  . 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 Atrial fibrillation with controlled ventricular rate ([place]) 06/17/2016  . Pulmonary nodule 06/17/2016  . Anemia 06/17/2016  . Thoracic compression fracture ([place]) 06/16/2016  . Dyspnea 02/25/2016  . PFO (patent foramen ovale) 02/25/2016    Past Surgical History:  Procedure Laterality Date  . TEE WITHOUT CARDIOVERSION N/A 02/29/2016   Procedure: TRANSESOPHAGEAL ECHOCARDIOGRAM (TEE);  Surgeon: [name], MD;  Location: [facility];  Service: Cardiovascular;  Laterality: N/A;    OB History    No data available       Home Medications    Prior to Admission medications   Medication Sig Start Date  End Date Taking? Authorizing Provider  acetaminophen (TYLENOL) 500 MG tablet Take 1,000 mg by mouth every 6 (six) hours as needed for headache (pain).   Yes Historical Provider, MD  amLODipine-valsartan (EXFORGE) 5-320 MG tablet Take 1 tablet by mouth daily.   Yes Historical Provider, MD  cycloSPORINE (RESTASIS) 0.05 % ophthalmic emulsion Place 1 drop into both eyes daily.   Yes Historical Provider, MD  furosemide (LASIX) 20 MG tablet Take 20 mg by mouth daily. 05/28/16  Yes Historical Provider, MD  HYDROcodone-acetaminophen (NORCO/VICODIN) 5-325 MG tablet Take 1-2 tablets by mouth every 4 (four) hours as needed. Patient taking differently: Take 1-2 tablets by mouth every 4 (four) hours as needed (pain).  05/19/16  Yes Lacretia Leigh, MD  potassium chloride (MICRO-K) 10 MEQ CR capsule Take 10 mEq by mouth daily. 04/22/16  Yes Historical Provider, MD  rivaroxaban (XARELTO) 20 MG TABS tablet Take 1 tablet (20 mg total) by mouth daily with supper. Patient taking differently: Take 20 mg by mouth See admin instructions. Take 1 tablet (20 mg) by mouth daily with supper for 30 days (start date 05/19/16) - then discuss continuation with cardiologist 05/19/16  Yes Lacretia Leigh, MD  aspirin EC 81 MG tablet Take 81 mg by mouth daily.    Historical Provider, MD    Family History History reviewed. No pertinent family history.  Social History Social History  Substance Use Topics  . Smoking status: Never Smoker  . Smokeless tobacco: Never Used  . Alcohol use No     Allergies  Patient has no known allergies.   Review of Systems Review of Systems  Constitutional: Positive for fatigue. Negative for fever.  Cardiovascular: Positive for chest pain.  Genitourinary: Positive for flank pain.  Musculoskeletal: Positive for back pain.  All other systems reviewed and are negative.    Physical Exam Updated Vital Signs BP 138/83 (BP Location: Right Arm)   Pulse 95   Temp 98 F (36.7 C) (Oral)   Resp 16    Ht 5\' 4"  (1.626 m)   SpO2 94%   Physical Exam  Constitutional: She is oriented to person, place, and time. She appears well-developed and well-nourished. No distress.  HENT:  Head: Normocephalic and atraumatic.  Eyes: Conjunctivae are normal.  Neck: Neck supple.  Cardiovascular: Normal rate, regular rhythm and normal heart sounds.  Exam reveals no friction rub.   No murmur heard. Pulmonary/Chest: Effort normal and breath sounds normal. No respiratory distress. She has no wheezes. She has no rales.  Abdominal: Soft. Bowel sounds are normal. She exhibits no distension.  Musculoskeletal: She exhibits no edema.       Thoracic back: She exhibits tenderness and bony tenderness.  Neurological: She is alert and oriented to person, place, and time. She exhibits normal muscle tone.  Skin: Skin is warm. Capillary refill takes less than 2 seconds.  Psychiatric: She has a normal mood and affect.  Nursing note and vitals reviewed.   Spine Exam: Inspection/Palpation: Diffuse, significant TTP throughout lower T-Spine. No deformity or bruising. Strength: 5/5 throughout LE bilaterally (hip flexion/extension, adduction/abduction; knee flexion/extension; foot dorsiflexion/plantarflexion, inversion/eversion; great toe inversion) Sensation: Intact to light touch in proximal and distal LE bilaterally Reflexes: 2+ quadriceps and achilles reflexes   ED Treatments / Results  Labs (all labs ordered are listed, but only abnormal results are displayed) Labs Reviewed  CBC - Abnormal; Notable for the following:       Result Value   WBC 3.9 (*)    RBC 3.58 (*)    Hemoglobin 11.2 (*)    HCT 34.1 (*)    All other components within normal limits  URINALYSIS, ROUTINE W REFLEX MICROSCOPIC - Abnormal; Notable for the following:    Specific Gravity, Urine >1.046 (*)    Leukocytes, UA TRACE (*)    Squamous Epithelial / LPF 0-5 (*)    All other components within normal limits  BASIC METABOLIC PANEL  CBC    PROTIME-INR  I-STAT TROPOININ, ED    EKG  EKG Interpretation None       Radiology Dg Chest 2 View  Result Date: 06/16/2016 CLINICAL DATA:  Back pain for 2 weeks EXAM: CHEST  2 VIEW COMPARISON:  None. FINDINGS: Cardiac shadow is mildly enlarged. Aortic calcifications are seen. The lungs are well aerated bilaterally without focal infiltrate or sizable effusion. Compression deformities are noted in the mid thoracic spine at what appear to be T8 and T9. These may be acute in nature given the clinical history. IMPRESSION: Compression deformities in the mid thoracic spine which may be acute in nature. No prior exam is available for comparison. Nonemergent bone scan or MRI would be helpful to assess for the degree of chronicity. Electronically Signed   By: Inez Catalina M.D.   On: 06/16/2016 18:52   Ct Angio Chest Pe W Or Wo Contrast  Result Date: 06/16/2016 CLINICAL DATA:  Chest pain. Back pain. Dyspnea. Compression deformities in the mid thoracic spine on chest radiograph from earlier today . EXAM: CT ANGIOGRAPHY CHEST WITH CONTRAST TECHNIQUE: Multidetector CT imaging  of the chest was performed using the standard protocol during bolus administration of intravenous contrast. Multiplanar CT image reconstructions and MIPs were obtained to evaluate the vascular anatomy. CONTRAST:  90 cc Isovue 370 IV. COMPARISON:  Chest radiograph from earlier today. FINDINGS: Cardiovascular: The study is high quality for the evaluation of pulmonary embolism. There are no filling defects in the central, lobar, segmental or subsegmental pulmonary artery branches to suggest acute pulmonary embolism. Mildly atherosclerotic tortuous nonaneurysmal thoracic aorta. Normal caliber pulmonary arteries. Top-normal heart size. No significant pericardial fluid/thickening. Mediastinum/Nodes: No discrete thyroid nodules. Unremarkable esophagus. No pathologically enlarged axillary, mediastinal or hilar lymph nodes. There is a simple  appearing cystic 1.2 x 1.0 cm structure in the anterior mediastinum (series 401/image 51. Lungs/Pleura: No pneumothorax. No pleural effusion. Subpleural perifissural peripheral right middle lobe 3 mm solid pulmonary nodule (series 407/ image 53). No acute consolidative airspace disease, lung masses or additional significant pulmonary nodules. Upper abdomen: Unremarkable. Musculoskeletal: No aggressive appearing focal osseous lesions. There are mild superior T8 and T9 vertebral compression fractures of indeterminate chronicity. Mild thoracic spondylosis. Review of the MIP images confirms the above findings. IMPRESSION: 1. No pulmonary embolism.  No acute pulmonary disease. 2. Mild T8 and T9 vertebral compression fractures of indeterminate chronicity, favor subacute or chronic, please see the separate concurrent thoracic spine CT report for further details. 3. Small simple 1.2 cm cystic structure in the anterior mediastinum, indeterminate, favor a benign thymic cyst, cannot exclude a cystic thymoma. Recommend initial follow-up chest CT with IV contrast in 3-6 months. 4. Solitary perifissural 3 mm right middle lobe solid pulmonary nodule, probably benign. No follow-up needed if patient is low-risk. Non-contrast chest CT can be considered in 12 months if patient is high-risk. This recommendation follows the consensus statement: Guidelines for Management of Incidental Pulmonary Nodules Detected on CT Images: From the Fleischner Society 2017; Radiology 2017; 284:228-243. 5. Aortic atherosclerosis. Electronically Signed   By: Ilona Sorrel M.D.   On: 06/16/2016 21:37   Ct Thoracic Spine Wo Contrast  Result Date: 06/16/2016 CLINICAL DATA:  Shortness of breath and severe back pain. Compression fracture on chest radiograph. EXAM: CT THORACIC SPINE WITHOUT CONTRAST TECHNIQUE: Multidetector CT images of the thoracic were obtained using the standard protocol without intravenous contrast. COMPARISON:  90 mL Isovue 370 FINDINGS:  Alignment: Normal alignment of the thoracic spine. Vertebrae: Compression deformities involving the superior and inferior endplates of T7, T8, and T9 vertebrae. Additional Schmorl's nodes are demonstrated T11. Mild superior endplate depression at T3. Fractures are age indeterminate without the benefit of comparison studies, however, no obvious acute cortical buckling or fracture line is identified and degenerative changes are present suggesting that these most likely represent chronic fractures. Consider osteoporosis in the appropriate clinical setting. No expansile or destructive bone lesions identified. Paraspinal and other soft tissues: No paraspinal mass or soft tissue swelling identified. Disc levels: Degenerative changes throughout the thoracic spine with narrowed interspaces and endplate hypertrophic changes throughout. No definite evidence to suggest significant bony encroachment upon the central canal. Visualized thoracic aorta is not dilated. No acute displaced rib fractures identified. IMPRESSION: Multiple mild thoracic vertebral compression fractures. These are age indeterminate but features suggest most likely chronic. Consider osteoporosis in the appropriate clinical setting. Electronically Signed   By: Lucienne Capers M.D.   On: 06/16/2016 21:28    Procedures Procedures (including critical care time)  Medications Ordered in ED Medications  morphine 4 MG/ML injection 2 mg (2 mg Intravenous Refused 06/16/16 2320)  cycloSPORINE (RESTASIS)  0.05 % ophthalmic emulsion 1 drop (not administered)  HYDROcodone-acetaminophen (NORCO/VICODIN) 5-325 MG per tablet 1-2 tablet (not administered)  acetaminophen (TYLENOL) tablet 650 mg (not administered)    Or  acetaminophen (TYLENOL) suppository 650 mg (not administered)  ondansetron (ZOFRAN) tablet 4 mg (not administered)    Or  ondansetron (ZOFRAN) injection 4 mg (not administered)  lidocaine (LIDODERM) 5 % 2 patch (not administered)  amLODipine  (NORVASC) tablet 5 mg (not administered)  irbesartan (AVAPRO) tablet 300 mg (not administered)  ondansetron (ZOFRAN) injection 4 mg (4 mg Intravenous Given 06/16/16 1938)  fentaNYL (SUBLIMAZE) injection 50 mcg (50 mcg Intravenous Given 06/16/16 1946)  0.9 %  sodium chloride infusion ( Intravenous Stopped 06/16/16 2158)  iopamidol (ISOVUE-370) 76 % injection (90 mLs  Contrast Given 06/16/16 2019)  oxyCODONE-acetaminophen (PERCOCET/ROXICET) 5-325 MG per tablet 1 tablet (1 tablet Oral Given 06/16/16 2159)  fentaNYL (SUBLIMAZE) injection 50 mcg (50 mcg Intravenous Given 06/16/16 2159)  sodium chloride 0.9 % bolus 1,000 mL (1,000 mLs Intravenous New Bag/Given 06/16/16 2319)     Initial Impression / Assessment and Plan / ED Course  I have reviewed the triage vital signs and the nursing notes.  Pertinent labs & imaging results that were available during my care of the patient were reviewed by me and considered in my medical decision making (see chart for details).     71 year old female who presents with severe back pain radiating to her sides. On arrival, vital signs are stable. She is neurovascularly intact in her distal lower extremities. Exam shows exquisite midline tenderness to palpation and initial plain films show possible new compression fractures. Initial differential includes pain secondary to compression fractures likely secondary to osteoporosis, renal colic, also must consider PE given flank pain and pleuritic component, although this may be's simply secondary to muscular skeletal pain. Will obtain CT angios as well as CT of her thoracic spine. No signs of cauda equina.  CT confirms multiple compression fractures. Her tenderness correlates directly with palpation of this. I discussed with Dr. Ellene Route of neurosurgery. Given her pain, will admit for pain control, as well as possible interventional radiology consultation in the morning for intervention  Final Clinical Impressions(s) / ED Diagnoses    Final diagnoses:  Chest pain  Compression fracture of thoracic spine, non-traumatic [facility])    New Prescriptions Current Discharge Medication List       Duffy Bruce, MD 06/17/16 [phone]

## 2016-06-16 NOTE — H&P (Signed)
History and Physical    Morgan Bowers [id number] DOB: 1945-10-21 DOA: 06/16/2016  PCP: [name], MD Consultants:  Einar Gip - cardiology Patient coming from: home - lives with daughter; Donald Prose: daughter, [phone]; [phone]  Chief Complaint: pain  HPI: Morgan Bowers is a 71 y.o. female with medical history significant of HTN presenting with "pain all over my body".  She reports that the pain starts in her back, radiating to both sides and front of chest.  Pain started after New Year's .  Does not remember trauma, heavy lifting.  No h/o osteoporosis.  She is unhappy because her thoracic compression fractures were not detected on her prior ER visit.  History is limited by her strong accent and somewhat limited [place].  The patient was also seen on 1/7.  Unfortunately, she provided a vague history with lots of concerns.  A thorough evaluation was performed including L-spine CT but unfortunately not T-spine.  CXR at that time did not show the abnormalities that were seen today.  She was found to be in afib and was started on Xarelto with plan for outpatient cardiology follow-up - that does not appear to have happened to date.   ED Course: Per Dr. Ellender Hose: 71 year old female who presents with severe back pain radiating to her sides. On arrival, vital signs are stable. She is neurovascularly intact in her distal lower extremities. Exam shows exquisite midline tenderness to palpation and initial plain films show possible new compression fractures. Initial differential includes pain secondary to compression fractures likely secondary to osteoporosis, renal colic, also must consider PE given flank pain and pleuritic component, although this may be's simply secondary to muscular skeletal pain. Will obtain CT angios as well as CT of her thoracic spine. No signs of cauda equina.  CT confirms multiple compression fractures. Her tenderness correlates directly with palpation of this. I discussed with  Dr. Ellene Route of neurosurgery. Given her pain, will admit for pain control, as well as possible interventional radiology consultation in the morning for intervention   Review of Systems: As per HPI; otherwise 10 point review of systems reviewed and negative.   Ambulatory Status:  Ambulates without assistance  Past Medical History:  Diagnosis Date  . GERD (gastroesophageal reflux disease)   . Hypertension     Past Surgical History:  Procedure Laterality Date  . TEE WITHOUT CARDIOVERSION N/A 02/29/2016   Procedure: TRANSESOPHAGEAL ECHOCARDIOGRAM (TEE);  Surgeon: [name], MD;  Location: [facility] ENDOSCOPY;  Service: Cardiovascular;  Laterality: N/A;    Social History   Social History  . Marital status: Widowed    Spouse name: N/A  . Number of children: N/A  . Years of education: N/A   Occupational History  . retired    Social History Main Topics  . Smoking status: Never Smoker  . Smokeless tobacco: Never Used  . Alcohol use No  . Drug use: No  . Sexual activity: No   Other Topics Concern  . Not on file   Social History Narrative   From [place].    No Known Allergies  History reviewed. No pertinent family history.  Prior to Admission medications   Medication Sig Start Date End Date Taking? Authorizing Provider  acetaminophen (TYLENOL) 500 MG tablet Take 1,000 mg by mouth every 6 (six) hours as needed for headache (pain).   Yes Historical Provider, MD  amLODipine-valsartan (EXFORGE) 5-320 MG tablet Take 1 tablet by mouth daily.   Yes Historical Provider, MD  cycloSPORINE (RESTASIS) 0.05 % ophthalmic emulsion Place 1  drop into both eyes daily.   Yes Historical Provider, MD  furosemide (LASIX) 20 MG tablet Take 20 mg by mouth daily. 05/28/16  Yes Historical Provider, MD  HYDROcodone-acetaminophen (NORCO/VICODIN) 5-325 MG tablet Take 1-2 tablets by mouth every 4 (four) hours as needed. Patient taking differently: Take 1-2 tablets by mouth every 4 (four) hours as needed (pain).   05/19/16  Yes Lacretia Leigh, MD  potassium chloride (MICRO-K) 10 MEQ CR capsule Take 10 mEq by mouth daily. 04/22/16  Yes Historical Provider, MD  rivaroxaban (XARELTO) 20 MG TABS tablet Take 1 tablet (20 mg total) by mouth daily with supper. Patient taking differently: Take 20 mg by mouth See admin instructions. Take 1 tablet (20 mg) by mouth daily with supper for 30 days (start date 05/19/16) - then discuss continuation with cardiologist 05/19/16  Yes Lacretia Leigh, MD  aspirin EC 81 MG tablet Take 81 mg by mouth daily.    Historical Provider, MD  cloNIDine (CATAPRES) 0.2 MG tablet Take 1 tablet (0.2 mg total) by mouth at bedtime. Patient not taking: Reported on 06/16/2016 07/08/15   Montine Circle, PA-C  HYDROcodone-acetaminophen (NORCO/VICODIN) 5-325 MG tablet Take 1 tablet by mouth every 8 (eight) hours as needed. Patient not taking: Reported on 06/16/2016 03/25/15   Quintella Reichert, MD  meclizine (ANTIVERT) 12.5 MG tablet Take 1 tablet (12.5 mg total) by mouth 3 (three) times daily as needed for dizziness. Patient not taking: Reported on 06/16/2016 10/28/15   Drenda Freeze, MD  naproxen (NAPROSYN) 250 MG tablet Take 1 tablet (250 mg total) by mouth 2 (two) times daily with a meal. Patient not taking: Reported on 06/16/2016 03/25/15   Quintella Reichert, MD  pantoprazole (PROTONIX) 40 MG tablet Take 1 tablet (40 mg total) by mouth daily. Patient not taking: Reported on 06/16/2016 05/24/12   Lajean Saver, MD    Physical Exam: Vitals:   06/16/16 2200 06/16/16 2215 06/16/16 2230 06/16/16 2315  BP: 107/76 121/80 125/72 131/92  Pulse: 93 92 92 91  Resp: 19 13 15 13   Temp:      TempSrc:      SpO2: 98% 95% 98% 98%     General:  Appears calm but uncomfortable and is NAD Eyes:  PERRL, EOMI, normal lids, iris ENT:  grossly normal hearing, lips & tongue, mmm Neck:  no LAD, masses or thyromegaly Cardiovascular:  Irregularly irregular but rate controlled, no m/r/g. No LE edema.  Respiratory:  CTA bilaterally,  no w/r/r. Normal respiratory effort. Abdomen:  soft, ntnd, NABS Skin:  no rash or induration seen on limited exam Musculoskeletal:  grossly normal tone BUE/BLE, good ROM, no bony abnormality; +TTP along the thoracic spine adjacent to reported compression fractures Psychiatric:  grossly normal mood and affect, speech fluent and appropriate, AOx3 Neurologic:  CN 2-12 grossly intact, moves all extremities in coordinated fashion, sensation intact  Labs on Admission: I have personally reviewed following labs and imaging studies  CBC:  Recent Labs Lab 06/16/16 1754  WBC 3.9*  HGB 11.2*  HCT 34.1*  MCV 95.3  PLT 0000000   Basic Metabolic Panel:  Recent Labs Lab 06/16/16 1754  NA 139  K 3.6  CL 101  CO2 28  GLUCOSE 81  BUN 12  CREATININE 0.83  CALCIUM 9.5   GFR: CrCl cannot be calculated (Unknown ideal weight.). Liver Function Tests: No results for input(s): AST, ALT, ALKPHOS, BILITOT, PROT, ALBUMIN in the last 168 hours. No results for input(s): LIPASE, AMYLASE in the last 168 hours. No  results for input(s): AMMONIA in the last 168 hours. Coagulation Profile: No results for input(s): INR, PROTIME in the last 168 hours. Cardiac Enzymes: No results for input(s): CKTOTAL, CKMB, CKMBINDEX, TROPONINI in the last 168 hours. BNP (last 3 results) No results for input(s): PROBNP in the last 8760 hours. HbA1C: No results for input(s): HGBA1C in the last 72 hours. CBG: No results for input(s): GLUCAP in the last 168 hours. Lipid Profile: No results for input(s): CHOL, HDL, LDLCALC, TRIG, CHOLHDL, LDLDIRECT in the last 72 hours. Thyroid Function Tests: No results for input(s): TSH, T4TOTAL, FREET4, T3FREE, THYROIDAB in the last 72 hours. Anemia Panel: No results for input(s): VITAMINB12, FOLATE, FERRITIN, TIBC, IRON, RETICCTPCT in the last 72 hours. Urine analysis:    Component Value Date/Time   COLORURINE YELLOW 06/16/2016 2051   APPEARANCEUR CLEAR 06/16/2016 2051   LABSPEC  >1.046 (H) 06/16/2016 2051   PHURINE 6.0 06/16/2016 2051   [place] NEGATIVE 06/16/2016 2051   HGBUR NEGATIVE 06/16/2016 2051   [place] NEGATIVE 06/16/2016 2051   [place] NEGATIVE 06/16/2016 2051   PROTEINUR NEGATIVE 06/16/2016 2051   UROBILINOGEN 0.2 04/15/2012 0014   NITRITE NEGATIVE 06/16/2016 2051   LEUKOCYTESUR TRACE (A) 06/16/2016 2051    Creatinine Clearance: CrCl cannot be calculated (Unknown ideal weight.).  Sepsis Labs: @LABRCNTIP (procalcitonin:4,lacticidven:4) )No results found for this or any previous visit (from the past 240 hour(s)).   Radiological Exams on Admission: Dg Chest 2 View  Result Date: 06/16/2016 CLINICAL DATA:  Back pain for 2 weeks EXAM: CHEST  2 VIEW COMPARISON:  None. FINDINGS: Cardiac shadow is mildly enlarged. Aortic calcifications are seen. The lungs are well aerated bilaterally without focal infiltrate or sizable effusion. Compression deformities are noted in the mid thoracic spine at what appear to be T8 and T9. These may be acute in nature given the clinical history. IMPRESSION: Compression deformities in the mid thoracic spine which may be acute in nature. No prior exam is available for comparison. Nonemergent bone scan or MRI would be helpful to assess for the degree of chronicity. Electronically Signed   By: Inez Catalina M.D.   On: 06/16/2016 18:52   Ct Angio Chest Pe W Or Wo Contrast  Result Date: 06/16/2016 CLINICAL DATA:  Chest pain. Back pain. Dyspnea. Compression deformities in the mid thoracic spine on chest radiograph from earlier today . EXAM: CT ANGIOGRAPHY CHEST WITH CONTRAST TECHNIQUE: Multidetector CT imaging of the chest was performed using the standard protocol during bolus administration of intravenous contrast. Multiplanar CT image reconstructions and MIPs were obtained to evaluate the vascular anatomy. CONTRAST:  90 cc Isovue 370 IV. COMPARISON:  Chest radiograph from earlier today. FINDINGS: Cardiovascular: The study is high  quality for the evaluation of pulmonary embolism. There are no filling defects in the central, lobar, segmental or subsegmental pulmonary artery branches to suggest acute pulmonary embolism. Mildly atherosclerotic tortuous nonaneurysmal thoracic aorta. Normal caliber pulmonary arteries. Top-normal heart size. No significant pericardial fluid/thickening. Mediastinum/Nodes: No discrete thyroid nodules. Unremarkable esophagus. No pathologically enlarged axillary, mediastinal or hilar lymph nodes. There is a simple appearing cystic 1.2 x 1.0 cm structure in the anterior mediastinum (series 401/image 51. Lungs/Pleura: No pneumothorax. No pleural effusion. Subpleural perifissural peripheral right middle lobe 3 mm solid pulmonary nodule (series 407/ image 53). No acute consolidative airspace disease, lung masses or additional significant pulmonary nodules. Upper abdomen: Unremarkable. Musculoskeletal: No aggressive appearing focal osseous lesions. There are mild superior T8 and T9 vertebral compression fractures of indeterminate chronicity. Mild thoracic spondylosis. Review of  the MIP images confirms the above findings. IMPRESSION: 1. No pulmonary embolism.  No acute pulmonary disease. 2. Mild T8 and T9 vertebral compression fractures of indeterminate chronicity, favor subacute or chronic, please see the separate concurrent thoracic spine CT report for further details. 3. Small simple 1.2 cm cystic structure in the anterior mediastinum, indeterminate, favor a benign thymic cyst, cannot exclude a cystic thymoma. Recommend initial follow-up chest CT with IV contrast in 3-6 months. 4. Solitary perifissural 3 mm right middle lobe solid pulmonary nodule, probably benign. No follow-up needed if patient is low-risk. Non-contrast chest CT can be considered in 12 months if patient is high-risk. This recommendation follows the consensus statement: Guidelines for Management of Incidental Pulmonary Nodules Detected on CT Images: From  the Fleischner Society 2017; Radiology 2017; 284:228-243. 5. Aortic atherosclerosis. Electronically Signed   By: Ilona Sorrel M.D.   On: 06/16/2016 21:37   Ct Thoracic Spine Wo Contrast  Result Date: 06/16/2016 CLINICAL DATA:  Shortness of breath and severe back pain. Compression fracture on chest radiograph. EXAM: CT THORACIC SPINE WITHOUT CONTRAST TECHNIQUE: Multidetector CT images of the thoracic were obtained using the standard protocol without intravenous contrast. COMPARISON:  90 mL Isovue 370 FINDINGS: Alignment: Normal alignment of the thoracic spine. Vertebrae: Compression deformities involving the superior and inferior endplates of T7, T8, and T9 vertebrae. Additional Schmorl's nodes are demonstrated T11. Mild superior endplate depression at T3. Fractures are age indeterminate without the benefit of comparison studies, however, no obvious acute cortical buckling or fracture line is identified and degenerative changes are present suggesting that these most likely represent chronic fractures. Consider osteoporosis in the appropriate clinical setting. No expansile or destructive bone lesions identified. Paraspinal and other soft tissues: No paraspinal mass or soft tissue swelling identified. Disc levels: Degenerative changes throughout the thoracic spine with narrowed interspaces and endplate hypertrophic changes throughout. No definite evidence to suggest significant bony encroachment upon the central canal. Visualized thoracic aorta is not dilated. No acute displaced rib fractures identified. IMPRESSION: Multiple mild thoracic vertebral compression fractures. These are age indeterminate but features suggest most likely chronic. Consider osteoporosis in the appropriate clinical setting. Electronically Signed   By: Lucienne Capers M.D.   On: 06/16/2016 21:28    EKG: Independently reviewed.  Afib with rate control; low voltage with poor R wave progression with no evidence of acute  ischemia  Assessment/Plan Principal Problem:   Thoracic compression fracture (HCC) Active Problems:   Thymoma   Atrial fibrillation with controlled ventricular rate (HCC)   Pulmonary nodule   Anemia   Compression fractures -Patient presenting with acute on chronic/subacute back pain with radiation to flanks/chest -Low suspicion for CAD/ACS and troponin is negative at 0.01 -Evaluation shows multiple thoracic compression fractures -Her exam shows pain consistent in location with compression fractures -Will treat with lidocaine patches and morphine for pain control -Neurosurgery was consulted by the ER and does not see indication for their intervention at this time -Will consult IR to see if kyphoplasty would be helpful - but she is on Xarelto (see below) -Will place in observation with the hope that she gain pain control quickly  Afib on anticoagulation -Patient presented for possibly this pain vs. Other on 1/7 and was found to be in afib -Appropriately, she was started on anticoagulation given her CHA2DS2-VASc score of 3 -She is completing her first month of Xarelto -Will hold for now -She may need several days off AC prior to IR intervention -If her risk is thought to be  high enough, she may benefit from Heparin drip - although at this time this does not appear to be the case  Thymoma -Incidental cystic structure seen on CT in the anterior mediastinum, 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 -Radiology suggests a f/u CT with contrast in 3-6 months  Pulmonary nodule -Also with incidental finding of pulmonary nodule -Given her lack of smoking history, she is low risk and so should not need additional f/u for this issue  Anemia -Hgb 11.2, stable, normocytic -Will follow  DVT prophylaxis: Xarelto - holding, may need additional DVT prophylaxis pending INR consult Code Status:  Full - may need to be readdressed since the patient did not seem to fully grasp this conversation Family  Communication: None present Disposition Plan:  Home once clinically improved Consults called: Neurosurgery by the ER  Admission status: It is my clinical opinion that referral for OBSERVATION is reasonable and necessary in this patient based on the above information provided. The aforementioned taken together are felt to place the patient at high risk for further clinical deterioration. However it is anticipated that the patient may be medically stable for discharge from the hospital within 24 to 48 hours.     Karmen Bongo MD [facility]  If 7PM-7AM, please contact night-coverage [email or web address] Password [email or web address]  06/17/2016, 12:06 AM

## 2016-06-16 NOTE — ED Notes (Signed)
EDP at bedside  

## 2016-06-17 ENCOUNTER — Encounter (HOSPITAL_COMMUNITY): Payer: Self-pay

## 2016-06-17 ENCOUNTER — Observation Stay (HOSPITAL_COMMUNITY): Payer: Medicaid Other

## 2016-06-17 DIAGNOSIS — D150 Benign neoplasm of thymus: Secondary | ICD-10-CM

## 2016-06-17 DIAGNOSIS — D4989 Neoplasm of unspecified behavior of other specified sites: Secondary | ICD-10-CM | POA: Diagnosis present

## 2016-06-17 DIAGNOSIS — Z79899 Other long term (current) drug therapy: Secondary | ICD-10-CM | POA: Diagnosis not present

## 2016-06-17 DIAGNOSIS — S22000A Wedge compression fracture of unspecified thoracic vertebra, initial encounter for closed fracture: Secondary | ICD-10-CM | POA: Diagnosis not present

## 2016-06-17 DIAGNOSIS — R911 Solitary pulmonary nodule: Secondary | ICD-10-CM | POA: Diagnosis present

## 2016-06-17 DIAGNOSIS — K5900 Constipation, unspecified: Secondary | ICD-10-CM | POA: Diagnosis not present

## 2016-06-17 DIAGNOSIS — I4891 Unspecified atrial fibrillation: Secondary | ICD-10-CM | POA: Diagnosis present

## 2016-06-17 DIAGNOSIS — M4854XA Collapsed vertebra, not elsewhere classified, thoracic region, initial encounter for fracture: Secondary | ICD-10-CM | POA: Diagnosis present

## 2016-06-17 DIAGNOSIS — D649 Anemia, unspecified: Secondary | ICD-10-CM | POA: Diagnosis present

## 2016-06-17 DIAGNOSIS — Z7982 Long term (current) use of aspirin: Secondary | ICD-10-CM | POA: Diagnosis not present

## 2016-06-17 DIAGNOSIS — I10 Essential (primary) hypertension: Secondary | ICD-10-CM | POA: Diagnosis present

## 2016-06-17 DIAGNOSIS — J40 Bronchitis, not specified as acute or chronic: Secondary | ICD-10-CM | POA: Diagnosis present

## 2016-06-17 DIAGNOSIS — Z7901 Long term (current) use of anticoagulants: Secondary | ICD-10-CM | POA: Diagnosis not present

## 2016-06-17 DIAGNOSIS — Z8679 Personal history of other diseases of the circulatory system: Secondary | ICD-10-CM | POA: Diagnosis present

## 2016-06-17 DIAGNOSIS — R05 Cough: Secondary | ICD-10-CM | POA: Diagnosis not present

## 2016-06-17 LAB — PROTIME-INR
INR: 1.43
Prothrombin Time: >90 seconds — ABNORMAL HIGH (ref 11.4–15.2)
Prothrombin Time: 17.6 seconds — ABNORMAL HIGH (ref 11.4–15.2)

## 2016-06-17 LAB — CBC
HEMATOCRIT: 31.3 % — AB (ref 36.0–46.0)
Hemoglobin: 10.1 g/dL — ABNORMAL LOW (ref 12.0–15.0)
MCH: 30.7 pg (ref 26.0–34.0)
MCHC: 32.3 g/dL (ref 30.0–36.0)
MCV: 95.1 fL (ref 78.0–100.0)
PLATELETS: 139 10*3/uL — AB (ref 150–400)
RBC: 3.29 MIL/uL — AB (ref 3.87–5.11)
RDW: 13 % (ref 11.5–15.5)
WBC: 3.8 10*3/uL — AB (ref 4.0–10.5)

## 2016-06-17 MED ORDER — ACETAMINOPHEN 325 MG PO TABS: 650.0000 mg | ORAL_TABLET | Freq: Four times a day (QID) | ORAL | Status: DC | PRN

## 2016-06-17 MED ORDER — ONDANSETRON HCL 4 MG/2ML IJ SOLN: 4.0000 mg | Freq: Four times a day (QID) | INTRAMUSCULAR | Status: DC | PRN

## 2016-06-17 MED ORDER — PNEUMOCOCCAL VAC POLYVALENT 25 MCG/0.5ML IJ INJ
0.5000 mL | INJECTION | INTRAMUSCULAR | Status: DC
  Filled 2016-06-17: qty 0.5

## 2016-06-17 MED ORDER — AMLODIPINE BESYLATE-VALSARTAN 5-320 MG PO TABS: 1.0000 | ORAL_TABLET | Freq: Every day | ORAL | Status: DC

## 2016-06-17 MED ORDER — IRBESARTAN 300 MG PO TABS
300.0000 mg | ORAL_TABLET | Freq: Every day | ORAL | Status: DC
  Administered 2016-06-18 – 2016-06-20 (×3): 300 mg via ORAL
  Filled 2016-06-17 (×4): qty 1

## 2016-06-17 MED ORDER — ONDANSETRON HCL 4 MG PO TABS: 4.0000 mg | ORAL_TABLET | Freq: Four times a day (QID) | ORAL | Status: DC | PRN

## 2016-06-17 MED ORDER — ACETAMINOPHEN 650 MG RE SUPP: 650.0000 mg | Freq: Four times a day (QID) | RECTAL | Status: DC | PRN

## 2016-06-17 MED ORDER — HYDROCODONE-ACETAMINOPHEN 5-325 MG PO TABS
1.0000 | ORAL_TABLET | ORAL | Status: DC | PRN
  Administered 2016-06-17: 1 via ORAL
  Administered 2016-06-17: 2 via ORAL
  Administered 2016-06-17: 1 via ORAL
  Administered 2016-06-18 – 2016-06-19 (×3): 2 via ORAL
  Filled 2016-06-17 (×3): qty 2
  Filled 2016-06-17: qty 1
  Filled 2016-06-17: qty 2
  Filled 2016-06-17: qty 1

## 2016-06-17 MED ORDER — CYCLOSPORINE 0.05 % OP EMUL
1.0000 [drp] | Freq: Every day | OPHTHALMIC | Status: DC
  Administered 2016-06-17 – 2016-06-20 (×4): 1 [drp] via OPHTHALMIC
  Filled 2016-06-17 (×4): qty 1

## 2016-06-17 MED ORDER — LIDOCAINE 5 % EX PTCH
2.0000 | MEDICATED_PATCH | CUTANEOUS | Status: DC
  Administered 2016-06-17 – 2016-06-20 (×4): 2 via TRANSDERMAL
  Filled 2016-06-17 (×5): qty 2

## 2016-06-17 MED ORDER — AMLODIPINE BESYLATE 5 MG PO TABS
5.0000 mg | ORAL_TABLET | Freq: Every day | ORAL | Status: DC
  Administered 2016-06-18 – 2016-06-20 (×3): 5 mg via ORAL
  Filled 2016-06-17 (×4): qty 1

## 2016-06-17 NOTE — Progress Notes (Signed)
Patient ID: Morgan Bowers, female   DOB: 1945-06-28, 71 y.o.   MRN: [id number]   Request received for VP/KP Thoracic spine  Dr Estanislado Pandy has reviewed imaging available, Will need MRI to determine if fracture(s) are acute.  MRI T spine w/o cx ordered.  Off Xarelto per chart (LD 06/16/2016)

## 2016-06-17 NOTE — Progress Notes (Signed)
Pt admitted to [facility] via the ED. Pt arrived accompanied by son. Pt stable at this time and in no acute distress.

## 2016-06-17 NOTE — Plan of Care (Signed)
Problem: Education: Goal: Knowledge of [facility] Education information/materials will improve Outcome: Completed/Met Date Met: 06/17/16 Discussed upon admission

## 2016-06-17 NOTE — Consult Note (Signed)
Chief Complaint: Patient was seen in consultation today for Thoracic 8 and 9 vertebroplasty/kyphoplasty Chief Complaint  Patient presents with  . Back Pain  . Chest Pain   at the request of Dr Princella Ion  Referring Physician(s): Dr Princella Ion  Supervising Physician: Luanne Bras  Patient Status: [facility] - In-pt  History of Present Illness: Morgan Bowers is a 71 y.o. female   Pt has had worsening back pain for weeks No known injury Presented to ED 2/4 Pain is mid back and radiates to front Severe pain----meds without relief MRI today: IMPRESSION: Acute or subacute partial compression fractures at T8 and T9. Loss of height of 10-20% at each level. No retropulsed bone or disc herniation. 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 No finding to suggest underlying tumor by imaging. Old minor loss of height from T1 through T7.  Request for VP/KP Dr Estanislado Pandy has reviewed imaging and approves procedure Pre certification is required for this procedure Pt knows we must wait til approved Will prepare for 2/6 Last dose Xarelto 2/4 per MD    Past Medical History:  Diagnosis Date  . GERD (gastroesophageal reflux disease)   . Hypertension     Past Surgical History:  Procedure Laterality Date  . TEE WITHOUT CARDIOVERSION N/A 02/29/2016   Procedure: TRANSESOPHAGEAL ECHOCARDIOGRAM (TEE);  Surgeon: [name], MD;  Location: [facility] ENDOSCOPY;  Service: Cardiovascular;  Laterality: N/A;    Allergies: Patient has no known allergies.  Medications: Prior to Admission medications   Medication Sig Start Date End Date Taking? Authorizing Provider  acetaminophen (TYLENOL) 500 MG tablet Take 1,000 mg by mouth every 6 (six) hours as needed for headache (pain).   Yes Historical Provider, MD  amLODipine-valsartan (EXFORGE) 5-320 MG tablet Take 1 tablet by mouth daily.   Yes Historical Provider, MD  cycloSPORINE (RESTASIS) 0.05 % ophthalmic emulsion Place 1 drop into both  eyes daily.   Yes Historical Provider, MD  furosemide (LASIX) 20 MG tablet Take 20 mg by mouth daily. 05/28/16  Yes Historical Provider, MD  HYDROcodone-acetaminophen (NORCO/VICODIN) 5-325 MG tablet Take 1-2 tablets by mouth every 4 (four) hours as needed. Patient taking differently: Take 1-2 tablets by mouth every 4 (four) hours as needed (pain).  05/19/16  Yes Lacretia Leigh, MD  potassium chloride (MICRO-K) 10 MEQ CR capsule Take 10 mEq by mouth daily. 04/22/16  Yes Historical Provider, MD  rivaroxaban (XARELTO) 20 MG TABS tablet Take 1 tablet (20 mg total) by mouth daily with supper. Patient taking differently: Take 20 mg by mouth See admin instructions. Take 1 tablet (20 mg) by mouth daily with supper for 30 days (start date 05/19/16) - then discuss continuation with cardiologist 05/19/16  Yes Lacretia Leigh, MD  aspirin EC 81 MG tablet Take 81 mg by mouth daily.    Historical Provider, MD     History reviewed. No pertinent family history.  Social History   Social History  . Marital status: Widowed    Spouse name: N/A  . Number of children: N/A  . Years of education: N/A   Occupational History  . retired    Social History Main Topics  . Smoking status: Never Smoker  . Smokeless tobacco: Never Used  . Alcohol use No  . Drug use: No  . Sexual activity: No   Other Topics Concern  . None   Social History Narrative   From [place].    Review of Systems: A 12 point ROS discussed and pertinent positives are indicated in  the HPI above.  All other systems are negative.  Review of Systems  Constitutional: Negative for chills and diaphoresis.  HENT: Positive for congestion.   Respiratory: Positive for cough.   Cardiovascular: Positive for chest pain and leg swelling.    Vital Signs: BP 106/70   Pulse 76   Temp 98.4 F (36.9 C)   Resp 16   Ht 5\' 4"  (1.626 m)   SpO2 99%   Physical Exam  Constitutional: She appears well-nourished. No distress.  HENT:  Head: Normocephalic and  atraumatic.  Eyes: EOM are normal.  Cardiovascular: Normal rate, regular rhythm and normal heart sounds.  Exam reveals no gallop and no friction rub.   No murmur heard. Pulmonary/Chest: Effort normal and breath sounds normal. No respiratory distress. She has no wheezes. She has no rales.  Abdominal: Soft. Bowel sounds are normal.  Musculoskeletal: She exhibits edema and tenderness.  2+ pitting edema LLE  TTP lower and mid-thoracic spine    Mallampati Score:  MD Evaluation Airway: WNL Heart: WNL Abdomen: WNL Chest/ Lungs: WNL ASA  Classification: 3 Mallampati/Airway Score: Two  Imaging: Dg Chest 2 View  Result Date: 06/16/2016 CLINICAL DATA:  Back pain for 2 weeks EXAM: CHEST  2 VIEW COMPARISON:  None. FINDINGS: Cardiac shadow is mildly enlarged. Aortic calcifications are seen. The lungs are well aerated bilaterally without focal infiltrate or sizable effusion. Compression deformities are noted in the mid thoracic spine at what appear to be T8 and T9. These may be acute in nature given the clinical history. IMPRESSION: Compression deformities in the mid thoracic spine which may be acute in nature. No prior exam is available for comparison. Nonemergent bone scan or MRI would be helpful to assess for the degree of chronicity. Electronically Signed   By: Inez Catalina M.D.   On: 06/16/2016 18:52   Dg Chest 2 View  Result Date: 05/19/2016 CLINICAL DATA:  71 year old female with history of epigastric pain for 1 week. Difficulty breathing. EXAM: CHEST  2 VIEW COMPARISON:  No priors. FINDINGS: Diffuse peribronchial cuffing. Lung volumes are low. No consolidative airspace disease. No pleural effusions. No evidence of pulmonary edema. Mild cardiomegaly. Upper mediastinal contours are within normal limits. Atherosclerosis in the thoracic aorta. IMPRESSION: 1. Diffuse peribronchial cuffing, concerning for an acute bronchitis. 2. Atherosclerotic that aortic atherosclerosis. 3. Mild cardiomegaly.  Electronically Signed   By: Vinnie Langton M.D.   On: 05/19/2016 07:41   Ct Angio Chest Pe W Or Wo Contrast  Result Date: 06/16/2016 CLINICAL DATA:  Chest pain. Back pain. Dyspnea. Compression deformities in the mid thoracic spine on chest radiograph from earlier today . EXAM: CT ANGIOGRAPHY CHEST WITH CONTRAST TECHNIQUE: Multidetector CT imaging of the chest was performed using the standard protocol during bolus administration of intravenous contrast. Multiplanar CT image reconstructions and MIPs were obtained to evaluate the vascular anatomy. CONTRAST:  90 cc Isovue 370 IV. COMPARISON:  Chest radiograph from earlier today. FINDINGS: Cardiovascular: The study is high quality for the evaluation of pulmonary embolism. There are no filling defects in the central, lobar, segmental or subsegmental pulmonary artery branches to suggest acute pulmonary embolism. Mildly atherosclerotic tortuous nonaneurysmal thoracic aorta. Normal caliber pulmonary arteries. Top-normal heart size. No significant pericardial fluid/thickening. Mediastinum/Nodes: No discrete thyroid nodules. Unremarkable esophagus. No pathologically enlarged axillary, mediastinal or hilar lymph nodes. There is a simple appearing cystic 1.2 x 1.0 cm structure in the anterior mediastinum (series 401/image 51. Lungs/Pleura: No pneumothorax. No pleural effusion. Subpleural perifissural peripheral right middle lobe 3  mm solid pulmonary nodule (series 407/ image 53). No acute consolidative airspace disease, lung masses or additional significant pulmonary nodules. Upper abdomen: Unremarkable. Musculoskeletal: No aggressive appearing focal osseous lesions. There are mild superior T8 and T9 vertebral compression fractures of indeterminate chronicity. Mild thoracic spondylosis. Review of the MIP images confirms the above findings. IMPRESSION: 1. No pulmonary embolism.  No acute pulmonary disease. 2. Mild T8 and T9 vertebral compression fractures of indeterminate  chronicity, favor subacute or chronic, please see the separate concurrent thoracic spine CT report for further details. 3. Small simple 1.2 cm cystic structure in the anterior mediastinum, indeterminate, favor a benign thymic cyst, cannot exclude a cystic thymoma. Recommend initial follow-up chest CT with IV contrast in 3-6 months. 4. Solitary perifissural 3 mm right middle lobe solid pulmonary nodule, probably benign. No follow-up needed if patient is low-risk. Non-contrast chest CT can be considered in 12 months if patient is high-risk. This recommendation follows the consensus statement: Guidelines for Management of Incidental Pulmonary Nodules Detected on CT Images: From the Fleischner Society 2017; Radiology 2017; 284:228-243. 5. Aortic atherosclerosis. Electronically Signed   By: Ilona Sorrel M.D.   On: 06/16/2016 21:37   Ct Thoracic Spine Wo Contrast  Result Date: 06/16/2016 CLINICAL DATA:  Shortness of breath and severe back pain. Compression fracture on chest radiograph. EXAM: CT THORACIC SPINE WITHOUT CONTRAST TECHNIQUE: Multidetector CT images of the thoracic were obtained using the standard protocol without intravenous contrast. COMPARISON:  90 mL Isovue 370 FINDINGS: Alignment: Normal alignment of the thoracic spine. Vertebrae: Compression deformities involving the superior and inferior endplates of T7, T8, and T9 vertebrae. Additional Schmorl's nodes are demonstrated T11. Mild superior endplate depression at T3. Fractures are age indeterminate without the benefit of comparison studies, however, no obvious acute cortical buckling or fracture line is identified and degenerative changes are present suggesting that these most likely represent chronic fractures. Consider osteoporosis in the appropriate clinical setting. No expansile or destructive bone lesions identified. Paraspinal and other soft tissues: No paraspinal mass or soft tissue swelling identified. Disc levels: Degenerative changes throughout  the thoracic spine with narrowed interspaces and endplate hypertrophic changes throughout. No definite evidence to suggest significant bony encroachment upon the central canal. Visualized thoracic aorta is not dilated. No acute displaced rib fractures identified. IMPRESSION: Multiple mild thoracic vertebral compression fractures. These are age indeterminate but features suggest most likely chronic. Consider osteoporosis in the appropriate clinical setting. Electronically Signed   By: Lucienne Capers M.D.   On: 06/16/2016 21:28   Mr Thoracic Spine Wo Contrast  Result Date: 06/17/2016 CLINICAL DATA:  Acute presentation with back pain EXAM: MRI THORACIC SPINE WITHOUT CONTRAST TECHNIQUE: Multiplanar, multisequence MR imaging of the thoracic spine was performed. No intravenous contrast was administered. COMPARISON:  Radiography and CT done yesterday. FINDINGS: Alignment:  Normal, with slight increased kyphotic curvature. Vertebrae: Acute or subacute compression fractures at T8 and T9 with loss of height of 10-20%. Edema throughout the vertebral bodies but without extension into the posterior elements. No evidence of focal destructive lesion. No evidence of antro or extraosseous tumor. Mild chronic loss of height in the upper thoracic spine from T1 through T7. No edema or focal lesion at those levels. Cord:  No cord compression or primary cord lesion. Paraspinal and other soft tissues: Negative Disc levels: No disc herniation.  No stenosis of the canal or foramina. IMPRESSION: Acute or subacute partial compression fractures at T8 and T9. Loss of height of 10-20% at each level. No retropulsed bone  or disc herniation. 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 No finding to suggest underlying tumor by imaging. Old minor loss of height from T1 through T7. Electronically Signed   By: Nelson Chimes M.D.   On: 06/17/2016 12:54   Ct Abdomen Pelvis W Contrast  Result Date: 05/19/2016 CLINICAL DATA:   Epigastric pain for 1 week EXAM: CT ABDOMEN AND PELVIS WITH CONTRAST TECHNIQUE: Multidetector CT imaging of the abdomen and pelvis was performed using the standard protocol following bolus administration of intravenous contrast. CONTRAST:  131mL ISOVUE-300 IOPAMIDOL (ISOVUE-300) INJECTION 61% COMPARISON:  None. FINDINGS: Lower chest: No acute abnormality. Hepatobiliary: No focal liver abnormality is seen. No gallstones, gallbladder wall thickening, or biliary dilatation. Pancreas: Unremarkable. No pancreatic ductal dilatation or surrounding inflammatory changes. Spleen: Normal in size without focal abnormality. Adrenals/Urinary Tract: Adrenal glands are unremarkable. Kidneys are normal, without renal calculi, focal lesion, or hydronephrosis. Bladder is unremarkable. Stomach/Bowel: Stomach is within normal limits. Appendix appears normal. No evidence of bowel wall thickening, distention, or inflammatory changes. Vascular/Lymphatic: Aortic atherosclerosis. No enlarged abdominal or pelvic lymph nodes. Reproductive: Uterus and bilateral adnexa are unremarkable. Other: No abdominal wall hernia or abnormality. No abdominopelvic ascites. Musculoskeletal: No acute or significant osseous findings. IMPRESSION: No acute intra-abdominal abnormality noted. Electronically Signed   By: Inez Catalina M.D.   On: 05/19/2016 07:39   Ct L-spine No Charge  Result Date: 05/19/2016 CLINICAL DATA:  Low back pain.  Bilateral leg pain for 1 week. EXAM: CT LUMBAR SPINE WITHOUT CONTRAST TECHNIQUE: Coned in and reformatted images of the lumbar spine were generated from patient's abdominal CT. COMPARISON:  None. FINDINGS: Segmentation: 5 lumbar type vertebrae. Alignment: Normal. Vertebrae: No acute fracture or focal pathologic process. Paraspinal and other soft tissues: Visualized on associated abdominal CT. Disc levels: T12- L1: Unremarkable. L1-L2: Unremarkable. L2-L3: Unremarkable. L3-L4: Mild disc bulging and degenerative facet spurring.  No evidence of impingement L4-L5: Mild disc bulging and degenerative facet spurring with ligamentum flavum thickening. No visualized impingement L5-S1:Mild disc narrowing with rightward endplate spurring and disc bulging. Mild facet spurring. Mild right foraminal narrowing. No compressive stenosis. IMPRESSION: 1. No acute finding.  No impingement to explain leg symptoms. 2. Mild degenerative changes as described. Electronically Signed   By: Monte Fantasia M.D.   On: 05/19/2016 07:47   US Abdomen Limited Ruq  Result Date: 05/19/2016 CLINICAL DATA:  71 year old presenting with a one-week history of right upper quadrant abdominal pain. EXAM: US ABDOMEN LIMITED - RIGHT UPPER QUADRANT COMPARISON:  No prior ultrasound. CT abdomen and pelvis performed earlier today. FINDINGS: Gallbladder: No shadowing gallstones or echogenic sludge. No gallbladder wall thickening or pericholecystic fluid. Negative sonographic Murphy sign according to the ultrasound technologist. Common bile duct: Diameter: Approximately 6 mm. Liver: Normal size and echotexture without focal parenchymal abnormality. Patent portal vein with hepatopetal flow. IMPRESSION: Normal examination. Electronically Signed   By: Evangeline Dakin M.D.   On: 05/19/2016 10:24    Labs:  CBC:  Recent Labs  10/28/15 1907 05/19/16 0105 06/16/16 1754 06/17/16 0450  WBC 3.8* 4.2 3.9* 3.8*  HGB 10.6* 10.9* 11.2* 10.1*  HCT 32.8* 33.2* 34.1* 31.3*  PLT 95* 143* 153 139*    COAGS:  Recent Labs  06/17/16 0450 06/17/16 0804  INR >10.00* 1.43    BMP:  Recent Labs  07/07/15 2158 10/28/15 1907 05/19/16 0105 06/16/16 1754  NA 139 136 138 139  K 3.8 4.4 3.8 3.6  CL 101 101 101 101  CO2 28 27 31  28  GLUCOSE 91 89 108* 81  BUN 14 10 13 12   CALCIUM 9.3 9.4 9.3 9.5  CREATININE 0.58 0.68 0.82 0.83  GFRNONAA >60 >60 >60 >60  GFRAA >60 >60 >60 >60    LIVER FUNCTION TESTS:  Recent Labs  10/28/15 1907 05/19/16 0105  BILITOT 0.7 0.7  AST 21  18  ALT 12* 11*  ALKPHOS 42 39  PROT 7.1 6.5  ALBUMIN 3.8 3.7    TUMOR MARKERS: No results for input(s): AFPTM, CEA, CA199, CHROMGRNA in the last 8760 hours.  Assessment and Plan:  Severe back pain Acute Thoracic 8 and 9 fractures Plan for vertebroplasty/kyphoplasty in am Risks and Benefits discussed with the patient including, but not limited to education regarding the natural healing process of compression fractures without intervention, bleeding, infection, cement migration which may cause spinal cord damage, paralysis, pulmonary embolism or even death. All of the patient's questions were answered, patient is agreeable to proceed. Consent signed and in chart. Awaiting Insurance approval Continue to hold Xarelto Will check in am for approval  Thank you for this interesting consult.  I greatly enjoyed meeting Morgan Bowers and look forward to participating in their care.  A copy of this report was sent to the requesting provider on this date.  Electronically Signed: Monia Sabal A 06/17/2016, 3:54 PM   I spent a total of 40 Minutes    in face to face in clinical consultation, greater than 50% of which was counseling/coordinating care for T8/9 VP/KP

## 2016-06-17 NOTE — Progress Notes (Addendum)
CRITICAL VALUE ALERT  Critical value received:  PT >90 & INR >10  Date of notification:  06/17/16  Time of notification:  0635  Critical value read back:Yes.    Nurse who received alert:  Thomasene Ripple  MD notified (1st page):  Dr. Tamala Julian   Time of first page:  [phone]  Dr. Tamala Julian responded at 0700 for an order for a redraw of the PT/INR. Will put those orders through and pass on information to day shift Rn.

## 2016-06-17 NOTE — Progress Notes (Signed)
PROGRESS NOTE    Morgan Bowers  [id number] DOB: 30-Mar-1946 DOA: 06/16/2016 PCP: [name], MD   Outpatient Specialists:     Brief Narrative:  Morgan Bowers is a 71 y.o. female with medical history significant of HTN presenting with "pain all over my body".  She reports that the pain starts in her back, radiating to both sides and front of chest.  Pain started after New Year's .  Does not remember trauma, heavy lifting.  No h/o osteoporosis.  She is unhappy because her thoracic compression fractures were not detected on her prior ER visit.  History is limited by her strong accent and somewhat limited [place].  The patient was also seen on 1/7.  Unfortunately, she provided a vague history with lots of concerns.  A thorough evaluation was performed including L-spine CT but unfortunately not T-spine.  CXR at that time did not show the abnormalities that were seen today.  She was found to be in afib and was started on Xarelto with plan for outpatient cardiology follow-up - that does not appear to have happened to date.   Assessment & Plan:   Principal Problem:   Thoracic compression fracture (HCC) Active Problems:   Thymoma   Atrial fibrillation with controlled ventricular rate (HCC)   Pulmonary nodule   Anemia   Compression fractures -Patient presenting with acute on chronic/subacute back pain with radiation to flanks/chest -Evaluation shows multiple thoracic compression fractures -Her exam shows pain consistent in location with compression fractures -Will treat with lidocaine patches and norco-- patient c/o pain but physically appears not to be in excruciating pain-- able to move around -Neurosurgery was consulted by the ER and does not see indication for their intervention at this time - appreciate IR- MRI pending   Afib on anticoagulation -Patient presented for possibly this pain vs. 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 -Radiology suggests a f/u CT with contrast in 3-6 months  Pulmonary nodule -Also with incidental finding of pulmonary nodule -Given her lack of smoking history, she is low risk and so should not need additional f/u for this issue  Anemia - normocytic -Will follow   DVT prophylaxis:  SCD's  Code Status: Full Code   Family Communication: At bedside  Disposition Plan:     Consultants:   IR    Subjective: C/o pain-- not able to give a number -per son patient was very active before this happened--- never had a DEXA scan  Objective: Vitals:   06/17/16 0053 06/17/16 0054 06/17/16 0600 06/17/16 0945  BP: 138/83  132/74 100/69  Pulse: 95  85 85  Resp: 16  16   Temp: 98 F (36.7 C)  98.3 F (36.8 C)   TempSrc: Oral  Oral   SpO2: 94%  98%   Height:  5\' 4"  (1.626 m)      Intake/Output Summary (Last 24 hours) at 06/17/16 1130 Last data filed at 06/17/16 0800  Gross per 24 hour  Intake             1480 ml  Output                0 ml  Net             1480 ml   There were no vitals filed for this visit.  Examination:  General exam: moving around in bed Respiratory system: Clear to auscultation. Respiratory effort normal. Cardiovascular system: S1 & S2 heard, RRR. No JVD, murmurs, rubs, gallops or clicks. No pedal edema. Gastrointestinal system: Abdomen is nondistended, soft and nontender. No organomegaly or masses felt. Normal bowel sounds heard. Central nervous system: Alert and oriented. No focal neurological deficits.     Data Reviewed: I have personally reviewed following labs and imaging studies  CBC:  Recent Labs Lab 06/16/16 1754 06/17/16 0450  WBC 3.9* 3.8*   HGB 11.2* 10.1*  HCT 34.1* 31.3*  MCV 95.3 95.1  PLT 153 [id number]*   Basic Metabolic Panel:  Recent Labs Lab 06/16/16 1754  NA 139  K 3.6  CL 101  CO2 28  GLUCOSE 81  BUN 12  CREATININE 0.83  CALCIUM 9.5   GFR: CrCl cannot be calculated (Unknown ideal weight.). Liver Function Tests: No results for input(s): AST, ALT, ALKPHOS, BILITOT, PROT, ALBUMIN in the last 168 hours. No results for input(s): LIPASE, AMYLASE in the last 168 hours. No results for input(s): AMMONIA in the last 168 hours. Coagulation Profile:  Recent Labs Lab 06/17/16 0450 06/17/16 0804  INR >10.00* 1.43   Cardiac Enzymes: No results for input(s): CKTOTAL, CKMB, CKMBINDEX, TROPONINI in the last 168 hours. BNP (last 3 results) No results for input(s): PROBNP in the last 8760 hours. HbA1C: No results for input(s): HGBA1C in the last 72 hours. CBG: No results for input(s): GLUCAP in the last 168 hours. Lipid Profile: No results for input(s): CHOL, HDL, LDLCALC, TRIG, CHOLHDL, LDLDIRECT in the last 72 hours. Thyroid Function Tests: No results for input(s): TSH, T4TOTAL, FREET4, T3FREE, THYROIDAB in the last 72 hours. Anemia Panel: No results for input(s): VITAMINB12, FOLATE, FERRITIN, TIBC, IRON, RETICCTPCT in the last 72 hours. Urine analysis:    Component Value Date/Time   COLORURINE YELLOW 06/16/2016 2051   APPEARANCEUR CLEAR 06/16/2016 2051   LABSPEC >1.046 (H) 06/16/2016 2051   PHURINE 6.0 06/16/2016 2051   GLUCOSEU NEGATIVE 06/16/2016 2051   HGBUR NEGATIVE 06/16/2016 2051   [place] NEGATIVE 06/16/2016 2051   [place] NEGATIVE 06/16/2016 2051   PROTEINUR NEGATIVE 06/16/2016 2051   UROBILINOGEN 0.2 04/15/2012 0014   NITRITE NEGATIVE 06/16/2016 2051   LEUKOCYTESUR TRACE (A) 06/16/2016 2051     )No results found for this or any previous visit (from the past 240 hour(s)).    Anti-infectives    None       Radiology Studies: Dg Chest 2 View  Result Date: 06/16/2016 CLINICAL  DATA:  Back pain for 2 weeks EXAM: CHEST  2 VIEW COMPARISON:  None. FINDINGS: Cardiac shadow is mildly enlarged. Aortic calcifications are seen. The lungs are well aerated bilaterally without focal infiltrate or sizable effusion. Compression deformities are noted in the mid thoracic spine at what appear to be T8 and T9. These may be acute in nature given the clinical history. IMPRESSION: Compression deformities in the mid thoracic spine which may be acute in nature. No prior exam is available for comparison. Nonemergent bone scan or MRI would be helpful to assess for the degree of chronicity. Electronically Signed   By: Inez Catalina M.D.   On: 06/16/2016 18:52   Ct Angio Chest Pe W Or Wo Contrast  Result Date: 06/16/2016 CLINICAL DATA:  Chest pain. Back pain. Dyspnea. Compression deformities in the  mid thoracic spine on chest radiograph from earlier today . EXAM: CT ANGIOGRAPHY CHEST WITH CONTRAST TECHNIQUE: Multidetector CT imaging of the chest was performed using the standard protocol during bolus administration of intravenous contrast. Multiplanar CT image reconstructions and MIPs were obtained to evaluate the vascular anatomy. CONTRAST:  90 cc Isovue 370 IV. COMPARISON:  Chest radiograph from earlier today. FINDINGS: Cardiovascular: The study is high quality for the evaluation of pulmonary embolism. There are no filling defects in the central, lobar, segmental or subsegmental pulmonary artery branches to suggest acute pulmonary embolism. Mildly atherosclerotic tortuous nonaneurysmal thoracic aorta. Normal caliber pulmonary arteries. Top-normal heart size. No significant pericardial fluid/thickening. Mediastinum/Nodes: No discrete thyroid nodules. Unremarkable esophagus. No pathologically enlarged axillary, mediastinal or hilar lymph nodes. There is a simple appearing cystic 1.2 x 1.0 cm structure in the anterior mediastinum (series 401/image 51. Lungs/Pleura: No pneumothorax. No pleural effusion. Subpleural  perifissural peripheral right middle lobe 3 mm solid pulmonary nodule (series 407/ image 53). No acute consolidative airspace disease, lung masses or additional significant pulmonary nodules. Upper abdomen: Unremarkable. Musculoskeletal: No aggressive appearing focal osseous lesions. There are mild superior T8 and T9 vertebral compression fractures of indeterminate chronicity. Mild thoracic spondylosis. Review of the MIP images confirms the above findings. IMPRESSION: 1. No pulmonary embolism.  No acute pulmonary disease. 2. Mild T8 and T9 vertebral compression fractures of indeterminate chronicity, favor subacute or chronic, please see the separate concurrent thoracic spine CT report for further details. 3. Small simple 1.2 cm cystic structure in the anterior mediastinum, indeterminate, favor a benign thymic cyst, cannot exclude a cystic thymoma. Recommend initial follow-up chest CT with IV contrast in 3-6 months. 4. Solitary perifissural 3 mm right middle lobe solid pulmonary nodule, probably benign. No follow-up needed if patient is low-risk. Non-contrast chest CT can be considered in 12 months if patient is high-risk. This recommendation follows the consensus statement: Guidelines for Management of Incidental Pulmonary Nodules Detected on CT Images: From the Fleischner Society 2017; Radiology 2017; 284:228-243. 5. Aortic atherosclerosis. Electronically Signed   By: Ilona Sorrel M.D.   On: 06/16/2016 21:37   Ct Thoracic Spine Wo Contrast  Result Date: 06/16/2016 CLINICAL DATA:  Shortness of breath and severe back pain. Compression fracture on chest radiograph. EXAM: CT THORACIC SPINE WITHOUT CONTRAST TECHNIQUE: Multidetector CT images of the thoracic were obtained using the standard protocol without intravenous contrast. COMPARISON:  90 mL Isovue 370 FINDINGS: Alignment: Normal alignment of the thoracic spine. Vertebrae: Compression deformities involving the superior and inferior endplates of T7, T8, and T9  vertebrae. Additional Schmorl's nodes are demonstrated T11. Mild superior endplate depression at T3. Fractures are age indeterminate without the benefit of comparison studies, however, no obvious acute cortical buckling or fracture line is identified and degenerative changes are present suggesting that these most likely represent chronic fractures. Consider osteoporosis in the appropriate clinical setting. No expansile or destructive bone lesions identified. Paraspinal and other soft tissues: No paraspinal mass or soft tissue swelling identified. Disc levels: Degenerative changes throughout the thoracic spine with narrowed interspaces and endplate hypertrophic changes throughout. No definite evidence to suggest significant bony encroachment upon the central canal. Visualized thoracic aorta is not dilated. No acute displaced rib fractures identified. IMPRESSION: Multiple mild thoracic vertebral compression fractures. These are age indeterminate but features suggest most likely chronic. Consider osteoporosis in the appropriate clinical setting. Electronically Signed   By: Lucienne Capers M.D.   On: 06/16/2016 21:28        Scheduled Meds: . amLODipine  5 mg Oral Daily  . cycloSPORINE  1 drop Both Eyes Daily  . irbesartan  300 mg Oral Daily  . lidocaine  2 patch Transdermal Q24H  .  morphine injection  2 mg Intravenous Once  . [START ON 06/18/2016] pneumococcal 23 valent vaccine  0.5 mL Intramuscular Tomorrow-1000   Continuous Infusions:   LOS: 0 days    Time spent: 25 min    [place], DO [facility] Pager [phone]  If 7PM-7AM, please contact night-coverage [email or web address] Password [email or web address] 06/17/2016, 11:30 AM

## 2016-06-18 LAB — BASIC METABOLIC PANEL
ANION GAP: 8 (ref 5–15)
BUN: 7 mg/dL (ref 6–20)
CALCIUM: 9.5 mg/dL (ref 8.9–10.3)
CO2: 30 mmol/L (ref 22–32)
Chloride: 99 mmol/L — ABNORMAL LOW (ref 101–111)
Creatinine, Ser: 0.7 mg/dL (ref 0.44–1.00)
Glucose, Bld: 100 mg/dL — ABNORMAL HIGH (ref 65–99)
POTASSIUM: 4.1 mmol/L (ref 3.5–5.1)
SODIUM: 137 mmol/L (ref 135–145)

## 2016-06-18 LAB — CBC
HCT: 34.7 % — ABNORMAL LOW (ref 36.0–46.0)
Hemoglobin: 11 g/dL — ABNORMAL LOW (ref 12.0–15.0)
MCH: 30.5 pg (ref 26.0–34.0)
MCHC: 31.7 g/dL (ref 30.0–36.0)
MCV: 96.1 fL (ref 78.0–100.0)
PLATELETS: 151 10*3/uL (ref 150–400)
RBC: 3.61 MIL/uL — AB (ref 3.87–5.11)
RDW: 13 % (ref 11.5–15.5)
WBC: 3.7 10*3/uL — AB (ref 4.0–10.5)

## 2016-06-18 LAB — VITAMIN D 25 HYDROXY (VIT D DEFICIENCY, FRACTURES): VIT D 25 HYDROXY: 8.1 ng/mL — AB (ref 30.0–100.0)

## 2016-06-18 LAB — PROTIME-INR
INR: 1.06
Prothrombin Time: 13.9 seconds (ref 11.4–15.2)

## 2016-06-18 MED ORDER — CALCIUM CARBONATE-VITAMIN D 500-200 MG-UNIT PO TABS
1.0000 | ORAL_TABLET | Freq: Two times a day (BID) | ORAL | Status: DC
  Administered 2016-06-18 – 2016-06-20 (×5): 1 via ORAL
  Filled 2016-06-18 (×5): qty 1

## 2016-06-18 MED ORDER — GUAIFENESIN-DM 100-10 MG/5ML PO SYRP
5.0000 mL | ORAL_SOLUTION | ORAL | Status: DC | PRN
  Administered 2016-06-18 – 2016-06-20 (×5): 5 mL via ORAL
  Filled 2016-06-18 (×5): qty 5

## 2016-06-18 MED ORDER — ALBUTEROL SULFATE (2.5 MG/3ML) 0.083% IN NEBU
2.5000 mg | INHALATION_SOLUTION | RESPIRATORY_TRACT | Status: DC | PRN
  Administered 2016-06-19 (×2): 2.5 mg via RESPIRATORY_TRACT
  Filled 2016-06-18 (×2): qty 3

## 2016-06-18 NOTE — Progress Notes (Signed)
PROGRESS NOTE    Morgan Bowers  [id number] DOB: [birth year] DOA: 06/16/2016 PCP: [name], MD   Outpatient Specialists:     Brief Narrative:  Morgan Bowers is a 71 y.o. female with medical history significant of HTN presenting with "pain all over my body".  She reports that the pain starts in her back, radiating to both sides and front of chest.  Pain started after New Year's .  Does not remember trauma, heavy lifting.  No h/o osteoporosis.  She is unhappy because her thoracic compression fractures were not detected on her prior ER visit.  History is limited by her strong accent and somewhat limited [place].  The patient was also seen on 1/7.  Unfortunately, she provided a vague history with lots of concerns.  A thorough evaluation was performed including L-spine CT but unfortunately not T-spine.  CXR at that time did not show the abnormalities that were seen today.  She was found to be in afib and was started on Xarelto with plan for outpatient cardiology follow-up - that does not appear to have happened to date.   Assessment & Plan:   Principal Problem:   Thoracic compression fracture (HCC) Active Problems:   Thymoma   Atrial fibrillation with controlled ventricular rate (HCC)   Pulmonary nodule   Anemia   Compression fractures -Patient presenting with acute on chronic/subacute back pain with radiation to flanks/chest -Evaluation shows multiple thoracic compression fractures -Her exam shows pain consistent in location with compression fractures -Will treat with lidocaine patches and norco-- patient c/o pain but physically appears not to be in excruciating pain-- able to move around -await insurance approval to do kyphoplasty -Vit D level decrease- outpatient follow up  Afib on anticoagulation -Patient presented for possibly this pain vs. 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 -Radiology suggests a f/u CT with contrast in 3-6 months  Pulmonary nodule -Also with incidental finding of pulmonary nodule -Given her lack of smoking history, she is low risk and so should not need additional f/u for this issue  Anemia - normocytic -Will follow   DVT prophylaxis:  SCD's  Code Status: Full Code   Family Communication: At bedside  Disposition Plan:     Consultants:   IR    Subjective: C/o pain-- not able to give a number -per son patient was very active before this happened--- never had a DEXA scan  Objective: Vitals:   06/17/16 0945 06/17/16 1419 06/17/16 2118 06/18/16 0513  BP: 100/69 106/70 128/74 (!) 142/78  Pulse: 85 76 81 82  Resp:  16 16 18   Temp:  98.4 F (36.9 C) 98.8 F (37.1 C) 98.2 F (36.8 C)  TempSrc:   Oral Oral  SpO2:  99% 96% 95%  Height:        Intake/Output Summary (Last 24 hours) at 06/18/16 1354 Last data filed at 06/17/16 1700  Gross per 24 hour  Intake              240 ml  Output                0 ml  Net              240 ml  There were no vitals filed for this visit.  Examination:  General exam: moving around in bed Respiratory system: Clear to auscultation. Respiratory effort normal. Cardiovascular system: S1 & S2 heard, RRR. No JVD, murmurs, rubs, gallops or clicks. No pedal edema. Gastrointestinal system: Abdomen is nondistended, soft and nontender. No organomegaly or masses felt. Normal bowel sounds heard. Central nervous system: Alert and oriented. No focal neurological deficits.     Data Reviewed: I have personally reviewed following labs and imaging studies  CBC:  Recent Labs Lab 06/16/16 1754 06/17/16 0450 06/18/16 0454  WBC 3.9* 3.8* 3.7*  HGB 11.2* 10.1* 11.0*  HCT 34.1* 31.3* 34.7*  MCV 95.3 95.1  96.1  PLT 153 139* [id number]   Basic Metabolic Panel:  Recent Labs Lab 06/16/16 1754 06/18/16 0454  NA 139 137  K 3.6 4.1  CL 101 99*  CO2 28 30  GLUCOSE 81 100*  BUN 12 7  CREATININE 0.83 0.70  CALCIUM 9.5 9.5   GFR: CrCl cannot be calculated (Unknown ideal weight.). Liver Function Tests: No results for input(s): AST, ALT, ALKPHOS, BILITOT, PROT, ALBUMIN in the last 168 hours. No results for input(s): LIPASE, AMYLASE in the last 168 hours. No results for input(s): AMMONIA in the last 168 hours. Coagulation Profile:  Recent Labs Lab 06/17/16 0450 06/17/16 0804 06/18/16 0454  INR >10.00* 1.43 1.06   Cardiac Enzymes: No results for input(s): CKTOTAL, CKMB, CKMBINDEX, TROPONINI in the last 168 hours. BNP (last 3 results) No results for input(s): PROBNP in the last 8760 hours. HbA1C: No results for input(s): HGBA1C in the last 72 hours. CBG: No results for input(s): GLUCAP in the last 168 hours. Lipid Profile: No results for input(s): CHOL, HDL, LDLCALC, TRIG, CHOLHDL, LDLDIRECT in the last 72 hours. Thyroid Function Tests: No results for input(s): TSH, T4TOTAL, FREET4, T3FREE, THYROIDAB in the last 72 hours. Anemia Panel: No results for input(s): VITAMINB12, FOLATE, FERRITIN, TIBC, IRON, RETICCTPCT in the last 72 hours. Urine analysis:    Component Value Date/Time   COLORURINE YELLOW 06/16/2016 2051   APPEARANCEUR CLEAR 06/16/2016 2051   LABSPEC >1.046 (H) 06/16/2016 2051   PHURINE 6.0 06/16/2016 2051   GLUCOSEU NEGATIVE 06/16/2016 2051   HGBUR NEGATIVE 06/16/2016 2051   [place] NEGATIVE 06/16/2016 2051   [place] NEGATIVE 06/16/2016 2051   PROTEINUR NEGATIVE 06/16/2016 2051   UROBILINOGEN 0.2 04/15/2012 0014   NITRITE NEGATIVE 06/16/2016 2051   LEUKOCYTESUR TRACE (A) 06/16/2016 2051     )No results found for this or any previous visit (from the past 240 hour(s)).    Anti-infectives    None       Radiology Studies: Dg Chest 2 View  Result Date:  06/16/2016 CLINICAL DATA:  Back pain for 2 weeks EXAM: CHEST  2 VIEW COMPARISON:  None. FINDINGS: Cardiac shadow is mildly enlarged. Aortic calcifications are seen. The lungs are well aerated bilaterally without focal infiltrate or sizable effusion. Compression deformities are noted in the mid thoracic spine at what appear to be T8 and T9. These may be acute in nature given the clinical history. IMPRESSION: Compression deformities in the mid thoracic spine which may be acute in nature. No prior exam is available for comparison. Nonemergent bone scan or MRI would be helpful to assess for the degree of chronicity. Electronically Signed   By: Inez Catalina M.D.   On: 06/16/2016 18:52   Ct Angio Chest Pe W Or Wo Contrast  Result Date: 06/16/2016 CLINICAL DATA:  Chest pain. Back pain. Dyspnea. Compression  deformities in the mid thoracic spine on chest radiograph from earlier today . EXAM: CT ANGIOGRAPHY CHEST WITH CONTRAST TECHNIQUE: Multidetector CT imaging of the chest was performed using the standard protocol during bolus administration of intravenous contrast. Multiplanar CT image reconstructions and MIPs were obtained to evaluate the vascular anatomy. CONTRAST:  90 cc Isovue 370 IV. COMPARISON:  Chest radiograph from earlier today. FINDINGS: Cardiovascular: The study is high quality for the evaluation of pulmonary embolism. There are no filling defects in the central, lobar, segmental or subsegmental pulmonary artery branches to suggest acute pulmonary embolism. Mildly atherosclerotic tortuous nonaneurysmal thoracic aorta. Normal caliber pulmonary arteries. Top-normal heart size. No significant pericardial fluid/thickening. Mediastinum/Nodes: No discrete thyroid nodules. Unremarkable esophagus. No pathologically enlarged axillary, mediastinal or hilar lymph nodes. There is a simple appearing cystic 1.2 x 1.0 cm structure in the anterior mediastinum (series 401/image 51. Lungs/Pleura: No pneumothorax. No pleural  effusion. Subpleural perifissural peripheral right middle lobe 3 mm solid pulmonary nodule (series 407/ image 53). No acute consolidative airspace disease, lung masses or additional significant pulmonary nodules. Upper abdomen: Unremarkable. Musculoskeletal: No aggressive appearing focal osseous lesions. There are mild superior T8 and T9 vertebral compression fractures of indeterminate chronicity. Mild thoracic spondylosis. Review of the MIP images confirms the above findings. IMPRESSION: 1. No pulmonary embolism.  No acute pulmonary disease. 2. Mild T8 and T9 vertebral compression fractures of indeterminate chronicity, favor subacute or chronic, please see the separate concurrent thoracic spine CT report for further details. 3. Small simple 1.2 cm cystic structure in the anterior mediastinum, indeterminate, favor a benign thymic cyst, cannot exclude a cystic thymoma. Recommend initial follow-up chest CT with IV contrast in 3-6 months. 4. Solitary perifissural 3 mm right middle lobe solid pulmonary nodule, probably benign. No follow-up needed if patient is low-risk. Non-contrast chest CT can be considered in 12 months if patient is high-risk. This recommendation follows the consensus statement: Guidelines for Management of Incidental Pulmonary Nodules Detected on CT Images: From the Fleischner Society 2017; Radiology 2017; 284:228-243. 5. Aortic atherosclerosis. Electronically Signed   By: Ilona Sorrel M.D.   On: 06/16/2016 21:37   Ct Thoracic Spine Wo Contrast  Result Date: 06/16/2016 CLINICAL DATA:  Shortness of breath and severe back pain. Compression fracture on chest radiograph. EXAM: CT THORACIC SPINE WITHOUT CONTRAST TECHNIQUE: Multidetector CT images of the thoracic were obtained using the standard protocol without intravenous contrast. COMPARISON:  90 mL Isovue 370 FINDINGS: Alignment: Normal alignment of the thoracic spine. Vertebrae: Compression deformities involving the superior and inferior endplates  of T7, T8, and T9 vertebrae. Additional Schmorl's nodes are demonstrated T11. Mild superior endplate depression at T3. Fractures are age indeterminate without the benefit of comparison studies, however, no obvious acute cortical buckling or fracture line is identified and degenerative changes are present suggesting that these most likely represent chronic fractures. Consider osteoporosis in the appropriate clinical setting. No expansile or destructive bone lesions identified. Paraspinal and other soft tissues: No paraspinal mass or soft tissue swelling identified. Disc levels: Degenerative changes throughout the thoracic spine with narrowed interspaces and endplate hypertrophic changes throughout. No definite evidence to suggest significant bony encroachment upon the central canal. Visualized thoracic aorta is not dilated. No acute displaced rib fractures identified. IMPRESSION: Multiple mild thoracic vertebral compression fractures. These are age indeterminate but features suggest most likely chronic. Consider osteoporosis in the appropriate clinical setting. Electronically Signed   By: Lucienne Capers M.D.   On: 06/16/2016 21:28   Mr Thoracic Spine Wo Contrast  Result Date: 06/17/2016 CLINICAL DATA:  Acute presentation with back pain EXAM: MRI THORACIC SPINE WITHOUT CONTRAST TECHNIQUE: Multiplanar, multisequence MR imaging of the thoracic spine was performed. No intravenous contrast was administered. COMPARISON:  Radiography and CT done yesterday. FINDINGS: Alignment:  Normal, with slight increased kyphotic curvature. Vertebrae: Acute or subacute compression fractures at T8 and T9 with loss of height of 10-20%. Edema throughout the vertebral bodies but without extension into the posterior elements. No evidence of focal destructive lesion. No evidence of antro or extraosseous tumor. Mild chronic loss of height in the upper thoracic spine from T1 through T7. No edema or focal lesion at those levels. Cord:  No  cord compression or primary cord lesion. Paraspinal and other soft tissues: Negative Disc levels: No disc herniation.  No stenosis of the canal or foramina. IMPRESSION: Acute or subacute partial compression fractures at T8 and T9. Loss of height of 10-20% at each level. No retropulsed bone or disc herniation. 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 No finding to suggest underlying tumor by imaging. Old minor loss of height from T1 through T7. Electronically Signed   By: Nelson Chimes M.D.   On: 06/17/2016 12:54        Scheduled Meds: . amLODipine  5 mg Oral Daily  . cycloSPORINE  1 drop Both Eyes Daily  . irbesartan  300 mg Oral Daily  . lidocaine  2 patch Transdermal Q24H  .  morphine injection  2 mg Intravenous Once  . pneumococcal 23 valent vaccine  0.5 mL Intramuscular Tomorrow-1000   Continuous Infusions:   LOS: 1 day    Time spent: 25 min    [place], DO [facility] Pager [phone]  If 7PM-7AM, please contact night-coverage [email or web address] Password [email or web address] 06/18/2016, 1:54 PM

## 2016-06-18 NOTE — Progress Notes (Signed)
PT Cancellation Note  Patient Details Name: Morgan Bowers MRN: [id number] DOB: [birth year]   Cancelled Treatment:    Reason Eval/Treat Not Completed: Patient at procedure or test/unavailable. Pt scheduled for vertebroplasty/kyphoplasty this AM.    Lorriane Shire 06/18/2016, 10:04 AM

## 2016-06-19 ENCOUNTER — Inpatient Hospital Stay (HOSPITAL_COMMUNITY): Payer: Medicaid Other

## 2016-06-19 DIAGNOSIS — R05 Cough: Secondary | ICD-10-CM

## 2016-06-19 HISTORY — PX: IR GENERIC HISTORICAL: IMG1180011

## 2016-06-19 MED ORDER — MIDAZOLAM HCL 2 MG/2ML IJ SOLN
INTRAMUSCULAR | Status: AC | PRN
  Administered 2016-06-19 (×3): 1 mg via INTRAVENOUS

## 2016-06-19 MED ORDER — SODIUM CHLORIDE 0.9 % IV SOLN
INTRAVENOUS | Status: AC
  Administered 2016-06-19: 15:00:00 via INTRAVENOUS

## 2016-06-19 MED ORDER — BUPIVACAINE HCL (PF) 0.25 % IJ SOLN
INTRAMUSCULAR | Status: AC | PRN
  Administered 2016-06-19: 20 mL

## 2016-06-19 MED ORDER — HYDROMORPHONE HCL 1 MG/ML IJ SOLN
INTRAMUSCULAR | Status: AC
  Filled 2016-06-19: qty 2

## 2016-06-19 MED ORDER — GELATIN ABSORBABLE 12-7 MM EX MISC
CUTANEOUS | Status: AC
  Filled 2016-06-19: qty 1

## 2016-06-19 MED ORDER — FENTANYL CITRATE (PF) 100 MCG/2ML IJ SOLN
INTRAMUSCULAR | Status: AC
  Filled 2016-06-19: qty 4

## 2016-06-19 MED ORDER — FENTANYL CITRATE (PF) 100 MCG/2ML IJ SOLN
INTRAMUSCULAR | Status: AC | PRN
  Administered 2016-06-19 (×3): 25 ug via INTRAVENOUS

## 2016-06-19 MED ORDER — TOBRAMYCIN SULFATE 1.2 G IJ SOLR
INTRAMUSCULAR | Status: AC
Start: 2016-06-19 — End: 2016-06-20
  Filled 2016-06-19: qty 1.2

## 2016-06-19 MED ORDER — CEFAZOLIN SODIUM-DEXTROSE 2-4 GM/100ML-% IV SOLN
INTRAVENOUS | Status: AC
  Administered 2016-06-19: 13:00:00
  Filled 2016-06-19: qty 100

## 2016-06-19 MED ORDER — AZITHROMYCIN 500 MG PO TABS
500.0000 mg | ORAL_TABLET | Freq: Every day | ORAL | Status: DC
  Administered 2016-06-19 – 2016-06-20 (×2): 500 mg via ORAL
  Filled 2016-06-19 (×3): qty 1

## 2016-06-19 MED ORDER — TOBRAMYCIN SULFATE 1.2 G IJ SOLR
INTRAMUSCULAR | Status: AC | PRN
  Administered 2016-06-19: 1.2 g via TOPICAL

## 2016-06-19 MED ORDER — ALBUTEROL SULFATE (2.5 MG/3ML) 0.083% IN NEBU
2.5000 mg | INHALATION_SOLUTION | Freq: Two times a day (BID) | RESPIRATORY_TRACT | Status: DC
  Administered 2016-06-19 – 2016-06-20 (×2): 2.5 mg via RESPIRATORY_TRACT
  Filled 2016-06-19 (×2): qty 3

## 2016-06-19 MED ORDER — BUPIVACAINE HCL (PF) 0.25 % IJ SOLN
INTRAMUSCULAR | Status: AC
  Filled 2016-06-19: qty 30

## 2016-06-19 MED ORDER — MIDAZOLAM HCL 2 MG/2ML IJ SOLN
INTRAMUSCULAR | Status: AC
  Filled 2016-06-19: qty 6

## 2016-06-19 MED ORDER — IOPAMIDOL (ISOVUE-300) INJECTION 61%
INTRAVENOUS | Status: AC
  Administered 2016-06-19: 5 mL
  Filled 2016-06-19: qty 50

## 2016-06-19 MED ORDER — GUAIFENESIN ER 600 MG PO TB12
600.0000 mg | ORAL_TABLET | Freq: Two times a day (BID) | ORAL | Status: DC
  Administered 2016-06-19 – 2016-06-20 (×3): 600 mg via ORAL
  Filled 2016-06-19 (×3): qty 1

## 2016-06-19 NOTE — Sedation Documentation (Signed)
Patient is resting comfortably. 

## 2016-06-19 NOTE — Progress Notes (Signed)
PROGRESS NOTE    Morgan Bowers  [id number] DOB: [birth year] DOA: 06/16/2016 PCP: [name], MD   Outpatient Specialists:     Brief Narrative:  Morgan Bowers is a 71 y.o. female with medical history significant of HTN presenting with "pain all over my body".  She reports that the pain starts in her back, radiating to both sides and front of chest.  Pain started after New Year's .  Does not remember trauma, heavy lifting.  No h/o osteoporosis.  She is unhappy because her thoracic compression fractures were not detected on her prior ER visit.  History is limited by her strong accent and somewhat limited [place].  The patient was also seen on 1/7.  Unfortunately, she provided a vague history with lots of concerns.  A thorough evaluation was performed including L-spine CT but unfortunately not T-spine.  CXR at that time did not show the abnormalities that were seen today.  She was found to be in afib and was started on Xarelto with plan for outpatient cardiology follow-up - that does not appear to have happened to date.   Assessment & Plan:   Principal Problem:   Thoracic compression fracture [facility]) Active Problems:   Thymoma   Atrial fibrillation with controlled ventricular rate (HCC)   Pulmonary nodule   Anemia   Compression fractures -Patient presenting with acute on chronic/subacute back pain with radiation to flanks/chest -Evaluation shows multiple thoracic compression fractures -Her exam shows pain consistent in location with compression fractures -Will treat with lidocaine patches and norco -await insurance approval to do kyphoplasty -Vit D level decreased- start treatment and outpatient follow up  Afib on anticoagulation - on 1/7 and was found to be in afib -Appropriately, she was started on anticoagulation given her CHA2DS2-VASc score of 3 -She is completing her first month of Xarelto -Will hold for now  Thymoma -Incidental cystic structure seen on CT in the  anterior mediastinum, 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 -Radiology suggests a f/u CT with contrast in 3-6 months  Pulmonary nodule -Also with incidental finding of pulmonary nodule -Given her lack of smoking history, she is low risk and so should not need additional f/u for this issue  Anemia - normocytic -Will follow  Cough with purulent sputum production- probable bronchitis -no PNA seen on x ray -z pak 2/7 x 5 days -mucinex   DVT prophylaxis:  SCD's  Code Status: Full Code   Family Communication: At bedside  Disposition Plan:     Consultants:   IR    Subjective: Still with what patient describes as severe back pain +cough with greenish/yellow sputum  Objective: Vitals:   06/17/16 2118 06/18/16 0513 06/18/16 1438 06/18/16 2056  BP: 128/74 (!) 142/78 136/76 114/68  Pulse: 81 82 84 89  Resp: 16 18 18 18   Temp: 98.8 F (37.1 C) 98.2 F (36.8 C) 98 F (36.7 C) 97.9 F (36.6 C)  TempSrc: Oral Oral Oral Oral  SpO2: 96% 95% 99% 97%  Height:        Intake/Output Summary (Last 24 hours) at 06/19/16 0917 Last data filed at 06/19/16 0700  Gross per 24 hour  Intake              480 ml  Output                0 ml  Net              480 ml   There were no vitals filed  for this visit.  Examination:  General exam: moving around in bed Respiratory system: occasional coarse breath sound on right, no wheezing Cardiovascular system: S1 & S2 heard, RRR. No JVD, murmurs, rubs, gallops or clicks. No pedal edema. Gastrointestinal system: Abdomen is nondistended, soft and nontender. No organomegaly or masses felt. Normal bowel sounds heard. Central nervous system: Alert and oriented. No focal neurological deficits.     Data Reviewed: I have personally reviewed following labs and imaging studies  CBC:  Recent Labs Lab 06/16/16 1754 06/17/16 0450 06/18/16 0454  WBC 3.9* 3.8* 3.7*  HGB 11.2* 10.1* 11.0*  HCT 34.1* 31.3* 34.7*  MCV 95.3 95.1 96.1  PLT  153 139* [id number]   Basic Metabolic Panel:  Recent Labs Lab 06/16/16 1754 06/18/16 0454  NA 139 137  K 3.6 4.1  CL 101 99*  CO2 28 30  GLUCOSE 81 100*  BUN 12 7  CREATININE 0.83 0.70  CALCIUM 9.5 9.5   GFR: CrCl cannot be calculated (Unknown ideal weight.). Liver Function Tests: No results for input(s): AST, ALT, ALKPHOS, BILITOT, PROT, ALBUMIN in the last 168 hours. No results for input(s): LIPASE, AMYLASE in the last 168 hours. No results for input(s): AMMONIA in the last 168 hours. Coagulation Profile:  Recent Labs Lab 06/17/16 0450 06/17/16 0804 06/18/16 0454  INR >10.00* 1.43 1.06   Cardiac Enzymes: No results for input(s): CKTOTAL, CKMB, CKMBINDEX, TROPONINI in the last 168 hours. BNP (last 3 results) No results for input(s): PROBNP in the last 8760 hours. HbA1C: No results for input(s): HGBA1C in the last 72 hours. CBG: No results for input(s): GLUCAP in the last 168 hours. Lipid Profile: No results for input(s): CHOL, HDL, LDLCALC, TRIG, CHOLHDL, LDLDIRECT in the last 72 hours. Thyroid Function Tests: No results for input(s): TSH, T4TOTAL, FREET4, T3FREE, THYROIDAB in the last 72 hours. Anemia Panel: No results for input(s): VITAMINB12, FOLATE, FERRITIN, TIBC, IRON, RETICCTPCT in the last 72 hours. Urine analysis:    Component Value Date/Time   COLORURINE YELLOW 06/16/2016 2051   APPEARANCEUR CLEAR 06/16/2016 2051   LABSPEC >1.046 (H) 06/16/2016 2051   PHURINE 6.0 06/16/2016 2051   GLUCOSEU NEGATIVE 06/16/2016 2051   HGBUR NEGATIVE 06/16/2016 2051   [place] NEGATIVE 06/16/2016 2051   [place] NEGATIVE 06/16/2016 2051   PROTEINUR NEGATIVE 06/16/2016 2051   UROBILINOGEN 0.2 04/15/2012 0014   NITRITE NEGATIVE 06/16/2016 2051   LEUKOCYTESUR TRACE (A) 06/16/2016 2051     )No results found for this or any previous visit (from the past 240 hour(s)).    Anti-infectives    Start     Dose/Rate Route Frequency Ordered Stop   06/19/16 1000   azithromycin (ZITHROMAX) tablet 500 mg     500 mg Oral Daily 06/19/16 0857         Radiology Studies: Mr Thoracic Spine Wo Contrast  Result Date: 06/17/2016 CLINICAL DATA:  Acute presentation with back pain EXAM: MRI THORACIC SPINE WITHOUT CONTRAST TECHNIQUE: Multiplanar, multisequence MR imaging of the thoracic spine was performed. No intravenous contrast was administered. COMPARISON:  Radiography and CT done yesterday. FINDINGS: Alignment:  Normal, with slight increased kyphotic curvature. Vertebrae: Acute or subacute compression fractures at T8 and T9 with loss of height of 10-20%. Edema throughout the vertebral bodies but without extension into the posterior elements. No evidence of focal destructive lesion. No evidence of antro or extraosseous tumor. Mild chronic loss of height in the upper thoracic spine from T1 through T7. No edema or focal lesion at those levels.  Cord:  No cord compression or primary cord lesion. Paraspinal and other soft tissues: Negative Disc levels: No disc herniation.  No stenosis of the canal or foramina. IMPRESSION: Acute or subacute partial compression fractures at T8 and T9. Loss of height of 10-20% at each level. No retropulsed bone or disc herniation. 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 No finding to suggest underlying tumor by imaging. Old minor loss of height from T1 through T7. Electronically Signed   By: Nelson Chimes M.D.   On: 06/17/2016 12:54        Scheduled Meds: . amLODipine  5 mg Oral Daily  . azithromycin  500 mg Oral Daily  . calcium-vitamin D  1 tablet Oral BID  . cycloSPORINE  1 drop Both Eyes Daily  . guaiFENesin  600 mg Oral BID  . irbesartan  300 mg Oral Daily  . lidocaine  2 patch Transdermal Q24H  .  morphine injection  2 mg Intravenous Once  . pneumococcal 23 valent vaccine  0.5 mL Intramuscular Tomorrow-1000   Continuous Infusions:   LOS: 2 days    Time spent: 25 min    [place], DO [facility] Pager [phone]  If 7PM-7AM, please contact night-coverage [email or web address] Password [facility] 06/19/2016, 9:17 AM

## 2016-06-19 NOTE — Sedation Documentation (Signed)
dsg to upper back intact 

## 2016-06-19 NOTE — Procedures (Signed)
S/P T 8 and T9 VP . Core biopsy obtained at both levels per Dr. Eliseo Squires.

## 2016-06-19 NOTE — Progress Notes (Signed)
PT Cancellation Note  Patient Details Name: Morgan Bowers MRN: [id number] DOB: December 08, 1945   Cancelled Treatment:    Reason Eval/Treat Not Completed: Patient at procedure or test/unavailable (Going to interventional radiology in 10 min per nurse).  Didn't get approval until today. Will check back in am.  Thanks.    Morgan Bowers 06/19/2016, 11:49 AM [facility] [phone] (pager)

## 2016-06-19 NOTE — Sedation Documentation (Signed)
Biopsy of T8 T9 to be done per Dr. Eulogio Bear

## 2016-06-20 ENCOUNTER — Encounter (HOSPITAL_COMMUNITY): Payer: Self-pay | Admitting: Interventional Radiology

## 2016-06-20 MED ORDER — AZITHROMYCIN 500 MG PO TABS: 500.0000 mg | ORAL_TABLET | Freq: Every day | ORAL | 0 refills | Status: DC

## 2016-06-20 MED ORDER — SENNOSIDES-DOCUSATE SODIUM 8.6-50 MG PO TABS
1.0000 | ORAL_TABLET | Freq: Two times a day (BID) | ORAL | Status: DC
  Administered 2016-06-20: 1 via ORAL
  Filled 2016-06-20: qty 1

## 2016-06-20 MED ORDER — HYDROCODONE-ACETAMINOPHEN 5-325 MG PO TABS: 1.0000 | ORAL_TABLET | ORAL | 0 refills | Status: DC | PRN

## 2016-06-20 MED ORDER — RIVAROXABAN 20 MG PO TABS: 20.0000 mg | ORAL_TABLET | Freq: Every day | ORAL | Status: DC

## 2016-06-20 MED ORDER — POLYETHYLENE GLYCOL 3350 17 G PO PACK: 17.0000 g | PACK | Freq: Every day | ORAL | 0 refills | Status: DC

## 2016-06-20 MED ORDER — POLYETHYLENE GLYCOL 3350 17 G PO PACK
17.0000 g | PACK | Freq: Every day | ORAL | Status: DC
  Administered 2016-06-20: 17 g via ORAL
  Filled 2016-06-20: qty 1

## 2016-06-20 MED ORDER — GUAIFENESIN ER 600 MG PO TB12: 600.0000 mg | ORAL_TABLET | Freq: Two times a day (BID) | ORAL | 0 refills | Status: DC

## 2016-06-20 MED ORDER — RIVAROXABAN 20 MG PO TABS: 20.0000 mg | ORAL_TABLET | Freq: Every day | ORAL | 2 refills | Status: DC

## 2016-06-20 NOTE — Evaluation (Signed)
Physical Therapy Evaluation Patient Details Name: Morgan Bowers MRN: [id number] DOB: July 30, 1945 Today's Date: 06/20/2016   History of Present Illness  Pt is a 71 yo female admitted on 06/16/16 for surgical intervention following a compression fx at T8-T9. Pt has PMH signficiant for thymoma, A-fib, pulmonary nodule, anemia, OP.    Clinical Impression  Pt presents with the above diagnosis. Prior to admission, pt was completely independent. Pt lives with her daughter in a two story home. Pt is able to perform gait throughout unit and transfers with no increased c/o discomfort and no LOB. Pt is at [facility] and will not require any further PT follow-up at this time.     Follow Up Recommendations No PT follow up    Equipment Recommendations  None recommended by PT    Recommendations for Other Services       Precautions / Restrictions Precautions Precautions: None Restrictions Weight Bearing Restrictions: No      Mobility  Bed Mobility               General bed mobility comments: Pt sitting EOB when PT arrives  Transfers Overall transfer level: Independent Equipment used: None             General transfer comment: No assistance needed  Ambulation/Gait Ambulation/Gait assistance: Independent Ambulation Distance (Feet): 500 Feet Assistive device: None Gait Pattern/deviations: WFL(Within Functional Limits)     General Gait Details: good sequencing, cadence and mobility noted  Stairs Stairs: Yes Stairs assistance: Independent Stair Management: No rails;Forwards Number of Stairs: 3    Wheelchair Mobility    Modified Rankin (Stroke Patients Only)       Balance                                             Pertinent Vitals/Pain Pain Assessment: No/denies pain    Home Living Family/patient expects to be discharged to:: Private residence Living Arrangements: Children Available Help at Discharge: Family Type of Home: House Home Access:  Stairs to enter Entrance Stairs-Rails: Psychiatric nurse of Steps: 3 Home Layout: Two level;Bed/bath upstairs Home Equipment: None      Prior Function Level of Independence: Independent               Hand Dominance   Dominant Hand: Right    Extremity/Trunk Assessment   Upper Extremity Assessment Upper Extremity Assessment: Overall WFL for tasks assessed    Lower Extremity Assessment Lower Extremity Assessment: Overall WFL for tasks assessed       Communication   Communication: Prefers language other than English  Cognition Arousal/Alertness: Awake/alert Behavior During Therapy: WFL for tasks assessed/performed Overall Cognitive Status: Within Functional Limits for tasks assessed                      General Comments      Exercises     Assessment/Plan    PT Assessment Patent does not need any further PT services  PT Problem List            PT Treatment Interventions      PT Goals (Current goals can be found in the Care Plan section)       Frequency     Barriers to discharge        Co-evaluation               End of  Session Equipment Utilized During Treatment: Gait belt Activity Tolerance: Patient tolerated treatment well Patient left: in chair;with call bell/phone within reach Nurse Communication: Mobility status         Time: [id number] PT Time Calculation (min) (ACUTE ONLY): 18 min   Charges:   PT Evaluation $PT Eval Low Complexity: 1 Procedure     PT G Codes:        Scheryl Marten PT, DPT  [phone]  06/20/2016, 1:06 PM

## 2016-06-20 NOTE — Progress Notes (Signed)
Referring Physician(s): Dr Princella Ion  Supervising Physician: Luanne Bras  Patient Status:  [facility] - In-pt  Chief Complaint:  Thoracic 8 and 9 Vertebroplasty 2/7 in IR  Subjective:  definitely better today Less pain in back Still with cough Complain of constipation  Allergies: Patient has no known allergies.  Medications: Prior to Admission medications   Medication Sig Start Date End Date Taking? Authorizing Provider  acetaminophen (TYLENOL) 500 MG tablet Take 1,000 mg by mouth every 6 (six) hours as needed for headache (pain).   Yes Historical Provider, MD  amLODipine-valsartan (EXFORGE) 5-320 MG tablet Take 1 tablet by mouth daily.   Yes Historical Provider, MD  cycloSPORINE (RESTASIS) 0.05 % ophthalmic emulsion Place 1 drop into both eyes daily.   Yes Historical Provider, MD  furosemide (LASIX) 20 MG tablet Take 20 mg by mouth daily. 05/28/16  Yes Historical Provider, MD  HYDROcodone-acetaminophen (NORCO/VICODIN) 5-325 MG tablet Take 1-2 tablets by mouth every 4 (four) hours as needed. Patient taking differently: Take 1-2 tablets by mouth every 4 (four) hours as needed (pain).  05/19/16  Yes Lacretia Leigh, MD  potassium chloride (MICRO-K) 10 MEQ CR capsule Take 10 mEq by mouth daily. 04/22/16  Yes Historical Provider, MD  rivaroxaban (XARELTO) 20 MG TABS tablet Take 1 tablet (20 mg total) by mouth daily with supper. Patient taking differently: Take 20 mg by mouth See admin instructions. Take 1 tablet (20 mg) by mouth daily with supper for 30 days (start date 05/19/16) - then discuss continuation with cardiologist 05/19/16  Yes Lacretia Leigh, MD  aspirin EC 81 MG tablet Take 81 mg by mouth daily.    Historical Provider, MD     Vital Signs: BP 110/86 (BP Location: Left Arm)   Pulse 79   Temp 98.2 F (36.8 C) (Oral)   Resp 16   Ht 5\' 4"  (D34-534 m)   SpO2 96%   Physical Exam  Constitutional: She is oriented to person, place, and time.  Abdominal: Soft.  Musculoskeletal:  Normal range of motion.  Neurological: She is alert and oriented to person, place, and time.  Skin: Skin is warm.  Sites of T8 and 9 VP are clean and dry NT no bleeding No hematoma   Psychiatric: She has a normal mood and affect. Her behavior is normal.  Nursing note and vitals reviewed.   Imaging: Dg Chest 2 View  Result Date: 06/16/2016 CLINICAL DATA:  Back pain for 2 weeks EXAM: CHEST  2 VIEW COMPARISON:  None. FINDINGS: Cardiac shadow is mildly enlarged. Aortic calcifications are seen. The lungs are well aerated bilaterally without focal infiltrate or sizable effusion. Compression deformities are noted in the mid thoracic spine at what appear to be T8 and T9. These may be acute in nature given the clinical history. IMPRESSION: Compression deformities in the mid thoracic spine which may be acute in nature. No prior exam is available for comparison. Nonemergent bone scan or MRI would be helpful to assess for the degree of chronicity. Electronically Signed   By: Inez Catalina M.D.   On: 06/16/2016 18:52   Ct Angio Chest Pe W Or Wo Contrast  Result Date: 06/16/2016 CLINICAL DATA:  Chest pain. Back pain. Dyspnea. Compression deformities in the mid thoracic spine on chest radiograph from earlier today . EXAM: CT ANGIOGRAPHY CHEST WITH CONTRAST TECHNIQUE: Multidetector CT imaging of the chest was performed using the standard protocol during bolus administration of intravenous contrast. Multiplanar CT image reconstructions and MIPs were obtained to evaluate  the vascular anatomy. CONTRAST:  90 cc Isovue 370 IV. COMPARISON:  Chest radiograph from earlier today. FINDINGS: Cardiovascular: The study is high quality for the evaluation of pulmonary embolism. There are no filling defects in the central, lobar, segmental or subsegmental pulmonary artery branches to suggest acute pulmonary embolism. Mildly atherosclerotic tortuous nonaneurysmal thoracic aorta. Normal caliber pulmonary arteries. Top-normal heart  size. No significant pericardial fluid/thickening. Mediastinum/Nodes: No discrete thyroid nodules. Unremarkable esophagus. No pathologically enlarged axillary, mediastinal or hilar lymph nodes. There is a simple appearing cystic 1.2 x 1.0 cm structure in the anterior mediastinum (series 401/image 51. Lungs/Pleura: No pneumothorax. No pleural effusion. Subpleural perifissural peripheral right middle lobe 3 mm solid pulmonary nodule (series 407/ image 53). No acute consolidative airspace disease, lung masses or additional significant pulmonary nodules. Upper abdomen: Unremarkable. Musculoskeletal: No aggressive appearing focal osseous lesions. There are mild superior T8 and T9 vertebral compression fractures of indeterminate chronicity. Mild thoracic spondylosis. Review of the MIP images confirms the above findings. IMPRESSION: 1. No pulmonary embolism.  No acute pulmonary disease. 2. Mild T8 and T9 vertebral compression fractures of indeterminate chronicity, favor subacute or chronic, please see the separate concurrent thoracic spine CT report for further details. 3. Small simple 1.2 cm cystic structure in the anterior mediastinum, indeterminate, favor a benign thymic cyst, cannot exclude a cystic thymoma. Recommend initial follow-up chest CT with IV contrast in 3-6 months. 4. Solitary perifissural 3 mm right middle lobe solid pulmonary nodule, probably benign. No follow-up needed if patient is low-risk. Non-contrast chest CT can be considered in 12 months if patient is high-risk. This recommendation follows the consensus statement: Guidelines for Management of Incidental Pulmonary Nodules Detected on CT Images: From the Fleischner Society 2017; Radiology 2017; 284:228-243. 5. Aortic atherosclerosis. Electronically Signed   By: Ilona Sorrel M.D.   On: 06/16/2016 21:37   Ct Thoracic Spine Wo Contrast  Result Date: 06/16/2016 CLINICAL DATA:  Shortness of breath and severe back pain. Compression fracture on chest  radiograph. EXAM: CT THORACIC SPINE WITHOUT CONTRAST TECHNIQUE: Multidetector CT images of the thoracic were obtained using the standard protocol without intravenous contrast. COMPARISON:  90 mL Isovue 370 FINDINGS: Alignment: Normal alignment of the thoracic spine. Vertebrae: Compression deformities involving the superior and inferior endplates of T7, T8, and T9 vertebrae. Additional Schmorl's nodes are demonstrated T11. Mild superior endplate depression at T3. Fractures are age indeterminate without the benefit of comparison studies, however, no obvious acute cortical buckling or fracture line is identified and degenerative changes are present suggesting that these most likely represent chronic fractures. Consider osteoporosis in the appropriate clinical setting. No expansile or destructive bone lesions identified. Paraspinal and other soft tissues: No paraspinal mass or soft tissue swelling identified. Disc levels: Degenerative changes throughout the thoracic spine with narrowed interspaces and endplate hypertrophic changes throughout. No definite evidence to suggest significant bony encroachment upon the central canal. Visualized thoracic aorta is not dilated. No acute displaced rib fractures identified. IMPRESSION: Multiple mild thoracic vertebral compression fractures. These are age indeterminate but features suggest most likely chronic. Consider osteoporosis in the appropriate clinical setting. Electronically Signed   By: Lucienne Capers M.D.   On: 06/16/2016 21:28   Mr Thoracic Spine Wo Contrast  Result Date: 06/17/2016 CLINICAL DATA:  Acute presentation with back pain EXAM: MRI THORACIC SPINE WITHOUT CONTRAST TECHNIQUE: Multiplanar, multisequence MR imaging of the thoracic spine was performed. No intravenous contrast was administered. COMPARISON:  Radiography and CT done yesterday. FINDINGS: Alignment:  Normal, with slight increased kyphotic  curvature. Vertebrae: Acute or subacute compression fractures  at T8 and T9 with loss of height of 10-20%. Edema throughout the vertebral bodies but without extension into the posterior elements. No evidence of focal destructive lesion. No evidence of antro or extraosseous tumor. Mild chronic loss of height in the upper thoracic spine from T1 through T7. No edema or focal lesion at those levels. Cord:  No cord compression or primary cord lesion. Paraspinal and other soft tissues: Negative Disc levels: No disc herniation.  No stenosis of the canal or foramina. IMPRESSION: Acute or subacute partial compression fractures at T8 and T9. Loss of height of 10-20% at each level. No retropulsed bone or disc herniation. 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 No finding to suggest underlying tumor by imaging. Old minor loss of height from T1 through T7. Electronically Signed   By: Nelson Chimes M.D.   On: 06/17/2016 12:54    Labs:  CBC:  Recent Labs  05/19/16 0105 06/16/16 1754 06/17/16 0450 06/18/16 0454  WBC 4.2 3.9* 3.8* 3.7*  HGB 10.9* 11.2* 10.1* 11.0*  HCT 33.2* 34.1* 31.3* 34.7*  PLT 143* 153 139* 151    COAGS:  Recent Labs  06/17/16 0450 06/17/16 0804 06/18/16 0454  INR >10.00* 1.43 1.06    BMP:  Recent Labs  10/28/15 1907 05/19/16 0105 06/16/16 1754 06/18/16 0454  NA 136 138 139 137  K 4.4 3.8 3.6 4.1  CL 101 101 101 99*  CO2 27 31 28 30   GLUCOSE 89 108* 81 100*  BUN 10 13 12 7   CALCIUM 9.4 9.3 9.5 9.5  CREATININE 0.68 0.82 0.83 0.70  GFRNONAA >60 >60 >60 >60  GFRAA >60 >60 >60 >60    LIVER FUNCTION TESTS:  Recent Labs  10/28/15 1907 05/19/16 0105  BILITOT 0.7 0.7  AST 21 18  ALT 12* 11*  ALKPHOS 42 39  PROT 7.1 6.5  ALBUMIN 3.8 3.7    Assessment and Plan:  Thoracic 8 and 9 VP 2/7 in IR Doing well today----less pain 2 week appt for follow up ----pt will hear from scheduler for time and date  Electronically Signed: Winford Hehn A 06/20/2016, 7:55 AM   I spent a total of 15 Minutes at the  the patient's bedside AND on the patient's hospital floor or unit, greater than 50% of which was counseling/coordinating care for T8/9 VP

## 2016-06-20 NOTE — Discharge Instructions (Signed)
1. No stooping,bending or lifting more than 10 lbs for 2 weeks. 2.Use walker to ambulate for 2 weeks.Marland Kitchen 3.RTC  Prn  in 2 weeks  Information on my medicine - XARELTO (Rivaroxaban)  This medication education was reviewed with me or my healthcare representative as part of my discharge preparation.    Why was Xarelto prescribed for you? Xarelto was prescribed for you to reduce the risk of a blood clot forming that can cause a stroke if you have a medical condition called atrial fibrillation (a type of irregular heartbeat).  What do you need to know about xarelto ? Take your Xarelto ONCE DAILY at the same time every day with your evening meal. If you have difficulty swallowing the tablet whole, you may crush it and mix in applesauce just prior to taking your dose.  Take Xarelto exactly as prescribed by your doctor and DO NOT stop taking Xarelto without talking to the doctor who prescribed the medication.  Stopping without other stroke prevention medication to take the place of Xarelto may increase your risk of developing a clot that causes a stroke.  Refill your prescription before you run out.  After discharge, you should have regular check-up appointments with your healthcare provider that is prescribing your Xarelto.  In the future your dose may need to be changed if your kidney function or weight changes by a significant amount.  What do you do if you miss a dose? If you are taking Xarelto ONCE DAILY and you miss a dose, take it as soon as you remember on the same day then continue your regularly scheduled once daily regimen the next day. Do not take two doses of Xarelto at the same time or on the same day.   Important Safety Information A possible side effect of Xarelto is bleeding. You should call your healthcare provider right away if you experience any of the following: ? Bleeding from an injury or your nose that does not stop. ? Unusual colored urine (red or dark brown) or  unusual colored stools (red or black). ? Unusual bruising for unknown reasons. ? A serious fall or if you hit your head (even if there is no bleeding).  Some medicines may interact with Xarelto and might increase your risk of bleeding while on Xarelto. To help avoid this, consult your healthcare provider or pharmacist prior to using any new prescription or non-prescription medications, including herbals, vitamins, non-steroidal anti-inflammatory drugs (NSAIDs) and supplements.  This website has more information on Xarelto: [email or web address]

## 2016-06-20 NOTE — Progress Notes (Signed)
Patient and patient's daughter, who is at bedside, both verbalized understanding of discharge instructions. Patient provided with prescriptions. Patient's IV removed. Patient to be taken downstairs in a wheelchair.

## 2016-06-20 NOTE — Discharge Summary (Signed)
Physician Discharge Summary  Morgan Bowers [id number] DOB: July 03, 1945 DOA: 06/16/2016  PCP: [name], MD  Admit date: 06/16/2016 Discharge date: 06/20/2016   Recommendations for Outpatient Follow-Up:   1. Biopsy of bone results pending 2. Appointment made with Dr. Einar Gip to follow up with a fib diagnosis made in ER 3. Follow up Vitamin D level -Radiology suggests a f/u CT with contrast in 3-6 months to follow up thymoma  Anemia follow up outpatient   Discharge Diagnosis:   Principal Problem:   Thoracic compression fracture ([place]) Active Problems:   Thymoma   Atrial fibrillation with controlled ventricular rate ([place])   Pulmonary nodule   Anemia   Discharge disposition:  Home. :  Discharge Condition: Improved.  Diet recommendation: Low sodium, heart healthy.  Wound care: None.   History of Present Illness:   Morgan Bowers is a 71 y.o. female with medical history significant of HTN presenting with "pain all over my body".  She reports that the pain starts in her back, radiating to both sides and front of chest.  Pain started after New Year's .  Does not remember trauma, heavy lifting.  No h/o osteoporosis.  She is unhappy because her thoracic compression fractures were not detected on her prior ER visit.  History is limited by her strong accent and somewhat limited [place].  The patient was also seen on 1/7.  Unfortunately, she provided a vague history with lots of concerns.  A thorough evaluation was performed including L-spine CT but unfortunately not T-spine.  CXR at that time did not show the abnormalities that were seen today.  She was found to be in afib and was started on Xarelto with plan for outpatient cardiology follow-up - that does not appear to have happened to date.   Hospital Course by Problem:   Compression fractures -Patient presenting with acute on chronic/subacute back pain with radiation to flanks/chest -Evaluation shows multiple thoracic  compression fractures -Her exam shows pain consistent in location with compression fractures -s/p kyphoplasty -Vit D level decreased- start treatment and outpatient follow up  Afib on anticoagulation - on 1/7 and was found to be in afib -Appropriately, she was started on anticoagulation given her CHA2DS2-VASc score of 3 -She is completing her first month of Xarelto -appointment made by me with Dr. 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 -Radiology suggests a f/u CT with contrast in 3-6 months  Pulmonary nodule -Also with incidental finding of pulmonary nodule -Given her lack of smoking history, she is low risk and so should not need additional f/u for this issue  Anemia - normocytic -Will follow outpatient  Cough with purulent sputum production- probable bronchitis -no PNA seen on x ray -z pak 2/7 x 5 days -mucinex    Medical Consultants:    IR   Discharge Exam:   Vitals:   06/19/16 2044 06/20/16 0515  BP: (!) 104/56 110/86  Pulse: 86 79  Resp: 16 16  Temp: 98.7 F (37.1 C) 98.2 F (36.8 C)   Vitals:   06/19/16 2035 06/19/16 2044 06/20/16 0515 06/20/16 0854  BP:  (!) 104/56 110/86   Pulse:  86 79   Resp:  16 16   Temp:  98.7 F (37.1 C) 98.2 F (36.8 C)   TempSrc:  Oral Oral   SpO2: 97% 96% 96% 97%  Height:        Gen:  NAD  The results of significant diagnostics from this hospitalization (including imaging, microbiology, ancillary and laboratory) are listed below for reference.     Procedures and Diagnostic Studies:   Dg Chest 2 View  Result Date: 06/16/2016 CLINICAL DATA:  Back pain for 2 weeks EXAM: CHEST  2 VIEW COMPARISON:  None. FINDINGS: Cardiac shadow is mildly enlarged. Aortic calcifications are seen. The lungs are well aerated bilaterally without focal infiltrate or sizable effusion. Compression deformities are noted in the mid thoracic spine at what  appear to be T8 and T9. These may be acute in nature given the clinical history. IMPRESSION: Compression deformities in the mid thoracic spine which may be acute in nature. No prior exam is available for comparison. Nonemergent bone scan or MRI would be helpful to assess for the degree of chronicity. Electronically Signed   By: Inez Catalina M.D.   On: 06/16/2016 18:52   Ct Angio Chest Pe W Or Wo Contrast  Result Date: 06/16/2016 CLINICAL DATA:  Chest pain. Back pain. Dyspnea. Compression deformities in the mid thoracic spine on chest radiograph from earlier today . EXAM: CT ANGIOGRAPHY CHEST WITH CONTRAST TECHNIQUE: Multidetector CT imaging of the chest was performed using the standard protocol during bolus administration of intravenous contrast. Multiplanar CT image reconstructions and MIPs were obtained to evaluate the vascular anatomy. CONTRAST:  90 cc Isovue 370 IV. COMPARISON:  Chest radiograph from earlier today. FINDINGS: Cardiovascular: The study is high quality for the evaluation of pulmonary embolism. There are no filling defects in the central, lobar, segmental or subsegmental pulmonary artery branches to suggest acute pulmonary embolism. Mildly atherosclerotic tortuous nonaneurysmal thoracic aorta. Normal caliber pulmonary arteries. Top-normal heart size. No significant pericardial fluid/thickening. Mediastinum/Nodes: No discrete thyroid nodules. Unremarkable esophagus. No pathologically enlarged axillary, mediastinal or hilar lymph nodes. There is a simple appearing cystic 1.2 x 1.0 cm structure in the anterior mediastinum (series 401/image 51. Lungs/Pleura: No pneumothorax. No pleural effusion. Subpleural perifissural peripheral right middle lobe 3 mm solid pulmonary nodule (series 407/ image 53). No acute consolidative airspace disease, lung masses or additional significant pulmonary nodules. Upper abdomen: Unremarkable. Musculoskeletal: No aggressive appearing focal osseous lesions. There are mild  superior T8 and T9 vertebral compression fractures of indeterminate chronicity. Mild thoracic spondylosis. Review of the MIP images confirms the above findings. IMPRESSION: 1. No pulmonary embolism.  No acute pulmonary disease. 2. Mild T8 and T9 vertebral compression fractures of indeterminate chronicity, favor subacute or chronic, please see the separate concurrent thoracic spine CT report for further details. 3. Small simple 1.2 cm cystic structure in the anterior mediastinum, indeterminate, favor a benign thymic cyst, cannot exclude a cystic thymoma. Recommend initial follow-up chest CT with IV contrast in 3-6 months. 4. Solitary perifissural 3 mm right middle lobe solid pulmonary nodule, probably benign. No follow-up needed if patient is low-risk. Non-contrast chest CT can be considered in 12 months if patient is high-risk. This recommendation follows the consensus statement: Guidelines for Management of Incidental Pulmonary Nodules Detected on CT Images: From the Fleischner Society 2017; Radiology 2017; 284:228-243. 5. Aortic atherosclerosis. Electronically Signed   By: Ilona Sorrel M.D.   On: 06/16/2016 21:37   Ct Thoracic Spine Wo Contrast  Result Date: 06/16/2016 CLINICAL DATA:  Shortness of breath and severe back pain. Compression fracture on chest radiograph. EXAM: CT THORACIC SPINE WITHOUT CONTRAST TECHNIQUE: Multidetector CT images of the thoracic were obtained using the standard protocol without intravenous contrast. COMPARISON:  90 mL Isovue 370 FINDINGS: Alignment: Normal alignment of the thoracic  spine. Vertebrae: Compression deformities involving the superior and inferior endplates of T7, T8, and T9 vertebrae. Additional Schmorl's nodes are demonstrated T11. Mild superior endplate depression at T3. Fractures are age indeterminate without the benefit of comparison studies, however, no obvious acute cortical buckling or fracture line is identified and degenerative changes are present suggesting  that these most likely represent chronic fractures. Consider osteoporosis in the appropriate clinical setting. No expansile or destructive bone lesions identified. Paraspinal and other soft tissues: No paraspinal mass or soft tissue swelling identified. Disc levels: Degenerative changes throughout the thoracic spine with narrowed interspaces and endplate hypertrophic changes throughout. No definite evidence to suggest significant bony encroachment upon the central canal. Visualized thoracic aorta is not dilated. No acute displaced rib fractures identified. IMPRESSION: Multiple mild thoracic vertebral compression fractures. These are age indeterminate but features suggest most likely chronic. Consider osteoporosis in the appropriate clinical setting. Electronically Signed   By: Lucienne Capers M.D.   On: 06/16/2016 21:28   Mr Thoracic Spine Wo Contrast  Result Date: 06/17/2016 CLINICAL DATA:  Acute presentation with back pain EXAM: MRI THORACIC SPINE WITHOUT CONTRAST TECHNIQUE: Multiplanar, multisequence MR imaging of the thoracic spine was performed. No intravenous contrast was administered. COMPARISON:  Radiography and CT done yesterday. FINDINGS: Alignment:  Normal, with slight increased kyphotic curvature. Vertebrae: Acute or subacute compression fractures at T8 and T9 with loss of height of 10-20%. Edema throughout the vertebral bodies but without extension into the posterior elements. No evidence of focal destructive lesion. No evidence of antro or extraosseous tumor. Mild chronic loss of height in the upper thoracic spine from T1 through T7. No edema or focal lesion at those levels. Cord:  No cord compression or primary cord lesion. Paraspinal and other soft tissues: Negative Disc levels: No disc herniation.  No stenosis of the canal or foramina. IMPRESSION: Acute or subacute partial compression fractures at T8 and T9. Loss of height of 10-20% at each level. No retropulsed bone or disc herniation. 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 No finding to suggest underlying tumor by imaging. Old minor loss of height from T1 through T7. Electronically Signed   By: Nelson Chimes M.D.   On: 06/17/2016 12:54     Labs:   Basic Metabolic Panel:  Recent Labs Lab 06/16/16 1754 06/18/16 0454  NA 139 137  K 3.6 4.1  CL 101 99*  CO2 28 30  GLUCOSE 81 100*  BUN 12 7  CREATININE 0.83 0.70  CALCIUM 9.5 9.5   GFR CrCl cannot be calculated (Unknown ideal weight.). Liver Function Tests: No results for input(s): AST, ALT, ALKPHOS, BILITOT, PROT, ALBUMIN in the last 168 hours. No results for input(s): LIPASE, AMYLASE in the last 168 hours. No results for input(s): AMMONIA in the last 168 hours. Coagulation profile  Recent Labs Lab 06/17/16 0450 06/17/16 0804 06/18/16 0454  INR >10.00* 1.43 1.06    CBC:  Recent Labs Lab 06/16/16 1754 06/17/16 0450 06/18/16 0454  WBC 3.9* 3.8* 3.7*  HGB 11.2* 10.1* 11.0*  HCT 34.1* 31.3* 34.7*  MCV 95.3 95.1 96.1  PLT 153 139* 151   Cardiac Enzymes: No results for input(s): CKTOTAL, CKMB, CKMBINDEX, TROPONINI in the last 168 hours. BNP: Invalid input(s): POCBNP CBG: No results for input(s): GLUCAP in the last 168 hours. D-Dimer No results for input(s): DDIMER in the last 72 hours. Hgb A1c No results for input(s): HGBA1C in the last 72 hours. Lipid Profile No results for input(s): CHOL, HDL, LDLCALC, TRIG,  CHOLHDL, LDLDIRECT in the last 72 hours. Thyroid function studies No results for input(s): TSH, T4TOTAL, T3FREE, THYROIDAB in the last 72 hours.  Invalid input(s): FREET3 Anemia work up No results for input(s): VITAMINB12, FOLATE, FERRITIN, TIBC, IRON, RETICCTPCT in the last 72 hours. Microbiology No results found for this or any previous visit (from the past 240 hour(s)).   Discharge Instructions:   Discharge Instructions    Diet - low sodium heart healthy    Complete by:  As directed    Increase activity slowly     Complete by:  As directed      Allergies as of 06/20/2016   No Known Allergies     Medication List    STOP taking these medications   aspirin EC 81 MG tablet     TAKE these medications   acetaminophen 500 MG tablet Commonly known as:  TYLENOL Take 1,000 mg by mouth every 6 (six) hours as needed for headache (pain).   amLODipine-valsartan 5-320 MG tablet Commonly known as:  EXFORGE Take 1 tablet by mouth daily.   azithromycin 500 MG tablet Commonly known as:  ZITHROMAX Take 1 tablet (500 mg total) by mouth daily. Start taking on:  06/21/2016   cycloSPORINE 0.05 % ophthalmic emulsion Commonly known as:  RESTASIS Place 1 drop into both eyes daily.   furosemide 20 MG tablet Commonly known as:  LASIX Take 20 mg by mouth daily.   guaiFENesin 600 MG 12 hr tablet Commonly known as:  MUCINEX Take 1 tablet (600 mg total) by mouth 2 (two) times daily.   HYDROcodone-acetaminophen 5-325 MG tablet Commonly known as:  NORCO/VICODIN Take 1-2 tablets by mouth every 4 (four) hours as needed (pain).   polyethylene glycol packet Commonly known as:  MIRALAX / GLYCOLAX Take 17 g by mouth daily. Start taking on:  06/21/2016   potassium chloride 10 MEQ CR capsule Commonly known as:  MICRO-K Take 10 mEq by mouth daily.   rivaroxaban 20 MG Tabs tablet Commonly known as:  XARELTO Take 1 tablet (20 mg total) by mouth daily with supper. What changed:  when to take this  additional instructions      Follow-up Information    DEVESHWAR, Fritz Pickerel, MD Follow up.   Specialty:  Interventional Radiology Why:  pt will hear from scheduler for time and date for 2 week follow up; call [phone] if questions or concerns Contact information: [address] [place] [address] [phone]        [name], MD Follow up in 1 week(s).   Specialty:  Internal Medicine Contact information: [place] [address] [phone]        [name],  MD Follow up.   Specialty:  Cardiology Why:  Feb 21st at [facility] for atrial fib follow up Contact information: [place] [address] [phone]            Time coordinating discharge: 35 min  Signed:  JESSICA Alison Stalling   [facility] 06/20/2016, 10:20 AM

## 2016-07-08 ENCOUNTER — Emergency Department (HOSPITAL_COMMUNITY)
Admission: EM | Admit: 2016-07-08 | Discharge: 2016-07-08 | Disposition: A | Discharge status: Home / Self Care | Payer: Medicaid Other | Attending: Emergency Medicine | Admitting: Emergency Medicine

## 2016-07-08 ENCOUNTER — Emergency Department (HOSPITAL_COMMUNITY): Payer: Medicaid Other

## 2016-07-08 ENCOUNTER — Ambulatory Visit (HOSPITAL_COMMUNITY)
Admission: RE | Admit: 2016-07-08 | Discharge: 2016-07-08 | Disposition: A | Discharge status: Home / Self Care | Payer: Medicaid Other | Source: Ambulatory Visit | Attending: Radiology | Admitting: Radiology

## 2016-07-08 ENCOUNTER — Encounter (HOSPITAL_COMMUNITY): Payer: Self-pay

## 2016-07-08 ENCOUNTER — Other Ambulatory Visit: Payer: Self-pay

## 2016-07-08 ENCOUNTER — Encounter (HOSPITAL_COMMUNITY): Payer: Self-pay | Admitting: Emergency Medicine

## 2016-07-08 DIAGNOSIS — Z79899 Other long term (current) drug therapy: Secondary | ICD-10-CM | POA: Diagnosis not present

## 2016-07-08 DIAGNOSIS — I10 Essential (primary) hypertension: Secondary | ICD-10-CM | POA: Diagnosis not present

## 2016-07-08 DIAGNOSIS — Z7901 Long term (current) use of anticoagulants: Secondary | ICD-10-CM | POA: Diagnosis not present

## 2016-07-08 DIAGNOSIS — R1084 Generalized abdominal pain: Secondary | ICD-10-CM | POA: Diagnosis not present

## 2016-07-08 DIAGNOSIS — R109 Unspecified abdominal pain: Secondary | ICD-10-CM | POA: Diagnosis present

## 2016-07-08 DIAGNOSIS — Z5321 Procedure and treatment not carried out due to patient leaving prior to being seen by health care provider: Secondary | ICD-10-CM | POA: Diagnosis not present

## 2016-07-08 DIAGNOSIS — S22000A Wedge compression fracture of unspecified thoracic vertebra, initial encounter for closed fracture: Secondary | ICD-10-CM

## 2016-07-08 LAB — BASIC METABOLIC PANEL
ANION GAP: 7 (ref 5–15)
BUN: 10 mg/dL (ref 6–20)
CHLORIDE: 101 mmol/L (ref 101–111)
CO2: 31 mmol/L (ref 22–32)
Calcium: 9.8 mg/dL (ref 8.9–10.3)
Creatinine, Ser: 0.71 mg/dL (ref 0.44–1.00)
GFR calc non Af Amer: >60 mL/min (ref >60)
Glucose, Bld: 86 mg/dL (ref 65–99)
Potassium: 4.5 mmol/L (ref 3.5–5.1)
SODIUM: 139 mmol/L (ref 135–145)

## 2016-07-08 LAB — CBC
HCT: 33.2 % — ABNORMAL LOW (ref 36.0–46.0)
HEMOGLOBIN: 10.8 g/dL — AB (ref 12.0–15.0)
MCH: 30.9 pg (ref 26.0–34.0)
MCHC: 32.5 g/dL (ref 30.0–36.0)
MCV: 94.9 fL (ref 78.0–100.0)
Platelets: 157 10*3/uL (ref 150–400)
RBC: 3.5 MIL/uL — AB (ref 3.87–5.11)
RDW: 13 % (ref 11.5–15.5)
WBC: 3 10*3/uL — AB (ref 4.0–10.5)

## 2016-07-08 LAB — I-STAT TROPONIN, ED: TROPONIN I, POC: 0 ng/mL (ref 0.00–0.08)

## 2016-07-08 MED ORDER — MORPHINE SULFATE (PF) 4 MG/ML IV SOLN
2.0000 mg | Freq: Once | INTRAVENOUS | Status: AC
  Administered 2016-07-08: 2 mg via INTRAVENOUS
  Filled 2016-07-08: qty 1

## 2016-07-08 MED ORDER — IOPAMIDOL (ISOVUE-300) INJECTION 61%
INTRAVENOUS | Status: AC
  Filled 2016-07-08: qty 100

## 2016-07-08 MED ORDER — IOPAMIDOL (ISOVUE-300) INJECTION 61%: 80.0000 mL | Freq: Once | INTRAVENOUS | Status: DC | PRN

## 2016-07-08 NOTE — ED Notes (Signed)
Pt changed into hospital gown upon arrival to room. Pt then ambulatory to restroom with steady gait. Upon return to room, pt accompanied by her son. Pt does understand some English but does not speak it very well, per pt and her son. This RN and Dr. Jeanell Sparrow informed pt and her son that a translator would be needed but pt refused. Necessity for translator explained to patient and son and pt still refused a Optometrist and stated, "she does not need a Optometrist. I am here and she understands some English." He was informed that there may be some private information the patient would like to share but pt and son still refused translator.

## 2016-07-08 NOTE — ED Notes (Addendum)
Pt was found in lobby.

## 2016-07-08 NOTE — ED Provider Notes (Signed)
4:54 PM Patient not in room   Pattricia Boss, MD 07/08/16 1654

## 2016-07-08 NOTE — ED Notes (Signed)
Called pt's name twice to obtain vitals, no one answered. Nurse first notified.

## 2016-07-08 NOTE — ED Triage Notes (Signed)
Pt reports recent back surgery 2 weeks ago, pt c/o back pain that radiates around to lower chest. Reports she has been here a few times for the same thing but is not able to sleep at night due to pain.

## 2016-07-08 NOTE — ED Notes (Signed)
Patient transported to CT 

## 2016-07-10 ENCOUNTER — Ambulatory Visit (HOSPITAL_COMMUNITY): Payer: Medicaid Other

## 2016-07-14 NOTE — H&P (Signed)
OFFICE VISIT NOTES COPIED TO EPIC FOR DOCUMENTATION  History of Present Illness Morgan Page MD; 07/03/2016 6:09 PM) Patient words: 2 week f/u post hospital vist; last 05/28/2016.  The patient is a 71 year old female who presents for a follow-up for Atrial fibrillation. Ms. Shala is a pleasant African American female. Patient presented with radicular pain in her chest on 05/19/2016 to the ED and was found to be in new onset of atrial fibrillation with controlled ventricular response, discharged home on anticoagulation. She was readmitted to the hospital on 06/16/2016 with radicular pain of the chest again, this time was found to have thoracic compression fracture, underwent kyphoplasty, vitamin D was added and was discharged home. She was also found to have asymptomatic small pulmonary nodule. She now presents here for cardiology follow-up. She is presently on Xarelto for anticoagulation since then. She continues to have radicular pain in her chest discomfort is improved. She continues to have mild dsypnea and denies recent worsening.  Her EKG on 02/19/2016 in our office had revealed sinus rhythm. No history of GI bleed, hematuria or bleeding from any other site. She has had a negative nuclear stress test in January 2014. She has a moderate sized PFO by TEE on 02/29/2016, asymptomatic.  Patient has Hypertension, BP has been fairly controlled with therapy. No history of diabetes. She has h/o high cholesterol, has been on Atorvastatin, she discontinued this one month ago thinking that she does not need to be on it anymore. She does not smoke.     Problem List/Past Medical Franky Macho Reader; 07/03/2016 12:48 PM) Essential hypertension, benign (I10)  Echocardiogram 02/07/2016: Left ventricle cavity is normal in size. Normal global wall motion. Normal diastolic filling pattern. Calculated EF 63%. Left atrial cavity is normal in size. 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 Interatrial septum bulges to the left suggests elevated Right heart pressure. Mild (Grade I) mitral regurgitation. Mild tricuspid regurgitation. Mild pulmonary hypertension. Pulmonary artery systolic pressure is estimated at 36 mm Hg. IVC is dilated with normal respiratory variation. Suggests elevated right heart pressure. Compared to the study done on 01/25/2015, mild pulmonary hypertension is new. Previously right atrium and right ventricle were reported as borderline enlarged. Lexiscan stress 06/12/12: 1. Resting EKG NSR, Stress EKG was non diagnostic for ischemia. No ST-T changes of ischemia noted with pharmacologic stress testing. Stress symptoms included light headedness and headache. Stress terminated due to completion of protocol. 2. The perfusion study demonstrated normal isotope uptake both at rest and stress. There was no evidence of ischemia or scar. Dynamic gated images reveal normal wall motion and endocardial thickening. Left ventricular ejection fraction was estimated to be 61%. Chest pain (R07.9)  Lexiscan stress 06/12/12: 1. Resting EKG NSR, Stress EKG was non diagnostic for ischemia. No ST-T changes of ischemia noted with pharmacologic stress testing. Stress symptoms included light headedness and headache. Stress terminated due to completion of protocol. 2. The perfusion study demonstrated normal isotope uptake both at rest and stress. There was no evidence of ischemia or scar. Dynamic gated images reveal normal wall motion and endocardial thickening. Left ventricular ejection fraction was estimated to be 61%. Mitral regurgitation (I34.0)  Hypercholesterolemia (E78.00)  PFO (patent foramen ovale) (Q21.1)  Moderate size. TEE 02/29/2016: Normal LVEF. Interatrial septum is aneurysmal. Moderate sized PFO with left to right shunting by color Doppler and strongly positive right to left shunting with Valsalva by agitated saline injection. Otherwise normal TEE. BMI 30.0-30.9,adult  (Z68.30)  Pulmonary hypertension, mild (I27.20)  PA systolic [id number] mmHg by echocardiogram on 02/07/2016. New onset atrial fibrillation (I48.91)  CHA2DS2-VASc score- 3 with yearly risk of stroke-3.2%.  Allergies (Charavina Reader; Jul 29, 2016 12:48 PM) No Known Drug Allergies 06/02/2012  Family History Franky Macho Reader; 2016-07-29 12:48 PM) Mother  Deceased. doesn't know medical history. Father  Deceased. doesn't know medical history. Sister 1  living-no known heart conditions.  Social History Franky Macho Reader; 2016-07-29 12:48 PM) Current tobacco use  Never smoker. Non Drinker/No Alcohol Use  Marital status  Widowed. Living Situation  Lives with daughter. Number of Children  9.  Past Surgical History Franky Macho Reader; 07/29/2016 12:48 PM) Cataract Extraction-Bilateral 2013  Medication History Franky Macho Reader; 07/29/16 2:35 PM) Lisinopril (40MG  Tablet, 1 Oral daily) Discontinued. Xarelto (20MG  Tablet, 1 (one) Tablet Oral daily, Taken starting 05/28/2016) Active. Furosemide (20MG  Tablet, 1 (one) Tablet Oral daily, Taken starting 05/28/2016) Active. Atorvastatin Calcium (20MG  Tablet, 1 Tablet Tablet Oral daily, Taken starting 04/21/2016) Discontinued: Patient Choice. Potassium Chloride ER (10MEQ Capsule ER, 1 Oral daily) Active. AmLODIPine Besylate (5MG  Tablet, 1 Oral daily) Discontinued. Amitriptyline HCl (10MG  Tablet, 1 Oral daily) Active. Drisdol (50000UNIT Capsule, 1 Oral weekly) Active. Medications Reconciled (verbally with pt; medication preset) Exforge (5-320MG  Tablet, 1 Oral daily) Discontinued. (Insurance did not pay for medicine.)  Diagnostic Studies History Franky Macho Reader; 07/29/2016 12:48 PM) Echocardiogram  Echo 12/10/12: 1. Left ventricular cavity is normal in size. Normal diastolic filling. Normal global wall motion. Normal systolic global function. Calculated EF 60%. 2. Left atrial cavity is mildly dilated. Moderate aneurysmal motion of  the interatrial septum. 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 3. Right atrial cavity is slightly dilated. Right ventricular cavity is borderline enlarged. Normal global wall motion. 4. Mitral valve structurally normal. Trace mitral regurgitation. 5. Tricuspid valve structurally normal. Mild tricuspid regurgitation. No pulmonary hypertension. Nuclear stress test  Lexiscan stress 06/12/12: 1. Resting EKG NSR, Stress EKG was non diagnostic for ischemia. No ST-T changes of ischemia noted with pharmacologic stress testing. Stress symptoms included light headedness and headache. Stress terminated due to completion of protocol. 2. The perfusion study demonstrated normal isotope uptake both at rest and stress. There was no evidence of ischemia or scar. Dynamic gated images reveal normal wall motion and endocardial thickening. Left ventricular ejection fraction was estimated to be 61%. TEE 02/29/2016 Chest X-ray 05/19/2016    Review of Systems Morgan Page MD; 07-29-2016 6:09 PM) General Not Present- Appetite Loss and Weight Gain. Respiratory Not Present- Chronic Cough and Wakes up from Sleep Wheezing or Short of Breath. Cardiovascular Present- Chest Pain and Edema. Gastrointestinal Not Present- Black, Tarry Stool and Difficulty Swallowing. Musculoskeletal Not Present- Decreased Range of Motion and Muscle Atrophy. Neurological Not Present- Attention Deficit. Psychiatric Not Present- Personality Changes and Suicidal Ideation. Endocrine Not Present- Cold Intolerance and Heat Intolerance. Hematology Not Present- Abnormal Bleeding. All other systems negative  Vitals Franky Macho Reader; 07-29-2016 1:05 PM) Jul 29, 2016 12:58 PM Weight: 186.13 lb Height: 65in Body Surface Area: 1.92 m Body Mass Index: 30.97 kg/m  Pulse: 90 (Regular)  P.OX: 98% (Room air) BP: 112/80 (Sitting, Left Arm, Standard)       Physical Exam Morgan Page MD; 29-Jul-2016 6:10  PM) General Mental Status-Alert. General Appearance-Cooperative and Appears stated age. Build & Nutrition-Well nourished and Moderately built.  Head and Neck Thyroid Gland Characteristics - normal size and consistency and no palpable nodules.  Chest and Lung Exam Chest and lung exam reveals -quiet, even and easy respiratory effort with no use of accessory muscles,  non-tender and on auscultation, normal breath sounds, no adventitious sounds.  Cardiovascular Cardiovascular examination reveals -carotid auscultation reveals no bruits, abdominal aorta auscultation reveals no bruits and no prominent pulsation, femoral artery auscultation bilaterally reveals normal pulses, no bruits, no thrills, normal pedal pulses bilaterally and no digital clubbing, cyanosis, edema, increased warmth or tenderness. Auscultation Rhythm - Irregular. Heart Sounds - S1 is variable, S2 WNL and No gallop present. Murmurs & Other Heart Sounds - Murmur - No murmur.  Abdomen Palpation/Percussion Palpation and Percussion of the abdomen reveal - Non Tender and No hepatosplenomegaly.  Neurologic Neurologic evaluation reveals -alert and oriented x 3 with no impairment of recent or remote memory. Motor-Grossly intact without any focal deficits.  Musculoskeletal Global Assessment Left Lower Extremity - no deformities, masses or tenderness, no known fractures. Right Lower Extremity - no deformities, masses or tenderness, no known fractures.    Assessment & Plan Morgan Page MD; 07/03/2016 6:18 PM) Persistent atrial fibrillation (I48.1) Story: CHA2DS2-VASc score- 3 with yearly risk of stroke-3.2%. Impression: EKG 07/03/2016: Atrial fibrillation with controlled ventricular response at the rate of 86 bpm, poor R-wave progression, probably normal variant. No evidence of ischemia. Normal QT interval. No significant change from EKG 05/27/2016 Current Plans Complete electrocardiogram (93000) Continued  Xarelto 20MG , 1 (one) Tablet daily, #30, 30 days starting 07/03/2016, Ref. x11. Discontinued AmLODIPine Besylate 5MG  (leg edema). Future Plans [id number]: METABOLIC PANEL, BASIC (99991111) - one time Bilateral leg edema (R60.0) Essential hypertension, benign (I10) Current Plans Started Metoprolol Succinate ER 50MG , 1 (one) Tablet daily, #30, 07/03/2016, Ref. x2. Local Order: Discontinue amlodipine and valsaratan combination Started Diovan 320MG , 1 (one) Tablet daily, #30, 30 days starting 07/03/2016, Ref. x3. Local Order: Please disreagard rx for Valsartan PFO (patent foramen ovale) (Q21.1) Story: Echocardiogram 02/07/2016: Left ventricle cavity is normal in size. Normal global wall motion. Normal diastolic filling pattern. Calculated EF 63%. Left atrial cavity is normal in size. 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 Interatrial septum bulges to the left suggests elevated Right heart pressure. Mild (Grade I) mitral regurgitation. Mild tricuspid regurgitation. Mild pulmonary hypertension. Pulmonary artery systolic pressure is estimated at 36 mm Hg. IVC is dilated with normal respiratory variation. Suggests elevated right heart pressure. Compared to the study done on 01/25/2015, mild pulmonary hypertension is new. Previously right atrium and right ventricle were reported as borderline enlarged. Impression: TEE 02/29/2016: Normal LVEF. Interatrial septum is aneurysmal. Moderate sized PFO with left to right shunting by color Doppler and strongly positive right to left shunting with Valsalva by agitated saline injection. Otherwise normal TEE. Mild mitral regurgitation (I34.0)  Hypercholesterolemia (E78.00)  Current Plans Changed Atorvastatin Calcium 20MG , 1 Tablet daily, #30, 07/03/2016, Ref. x11. Mechanism of underlying disease process and action of medications discussed with the patient. I discussed primary/secondary prevention and also dietary counceling was done.  Patient extremely difficult to communicate, her main issue is chest wall pain from radicular pain from thoracic spine compression fracture.  She is asymptomatic, with regard to atrial fibrillation. However this is a new onset of atrial fibrillation, she is presently tolerating anticoagulation and I have advised her that she needs lifelong and correlation due to high risk for cardioembolic phenomena. However although asymptomatic, as this is a new onset of atrial fibrillation, left atrial size is normal, I would like to see if he can maintain sinus rhythm hence I discussed regarding cardioversion which she is willing to proceed.  I have refilled her statins, also discontinued amlodipine to see whether her leg  edema would improve although her leg edema may be related to mild chronic venous insufficiency. I'll start the patient on low-dose of a beta blocker which may help in maintaining sinus rhythm with Toprol-XL 50 mg daily. She'll continue with valsartan 320 mg daily. We could certainly consider valsartan HCT. I'll see her back after the cardioversion and make further additions.  With regard to atrial septal defect, she is showing evidence of right-sided heart strain by echocardiogram performed on 02/07/2016 and also 01/25/2015. This may be something we may want to consider her for ASD repair. We will continue to evaluate this and consider repeating echocardiogram on her next office visit. This was a greater than 40 minute office visit with greater than 50% of the time spent with face-to-face encounter with patient and evaluation of complex medical issues, review of external records and coordination of care.  CC Dr. Nolene Ebbs.  Signed by Morgan Page, MD (07/03/2016 6:18 PM)

## 2016-07-16 ENCOUNTER — Encounter (HOSPITAL_COMMUNITY): Admission: RE | Payer: Self-pay | Source: Ambulatory Visit

## 2016-07-16 ENCOUNTER — Ambulatory Visit (HOSPITAL_COMMUNITY): Admission: RE | Admit: 2016-07-16 | Payer: Medicaid Other | Source: Ambulatory Visit | Admitting: Cardiology

## 2016-07-16 SURGERY — CARDIOVERSION
Anesthesia: Monitor Anesthesia Care

## 2016-07-29 ENCOUNTER — Telehealth (HOSPITAL_COMMUNITY): Payer: Self-pay

## 2016-07-29 ENCOUNTER — Other Ambulatory Visit (HOSPITAL_COMMUNITY): Payer: Self-pay | Admitting: Interventional Radiology

## 2016-07-29 DIAGNOSIS — IMO0001 Reserved for inherently not codable concepts without codable children: Secondary | ICD-10-CM

## 2016-07-29 DIAGNOSIS — M4850XA Collapsed vertebra, not elsewhere classified, site unspecified, initial encounter for fracture: Principal | ICD-10-CM

## 2016-07-29 NOTE — Telephone Encounter (Signed)
Spoke with pt daughter and she stated that pt was still having back pain off and on. She went to her pcp today and he gave her some pain medication for the pain. She will speak with her mother and call back. AW

## 2016-08-04 NOTE — H&P (Addendum)
OFFICE VISIT NOTES COPIED TO EPIC FOR DOCUMENTATION  . History of Present Illness Morgan Page MD; 2016-08-16 5:40 PM) Patient words: Last O/V 07/03/2016: F/U w/ EKG for Afib.  The patient is a 71 year old female who presents for a follow-up for Atrial fibrillation. Ms. Morgan Bowers is a pleasant African American female. Patient presented with radicular pain in her chest on 05/19/2016 to the ED and was found to be in new onset of atrial fibrillation with controlled ventricular response, discharged home on anticoagulation. She still continues to have radicular pain in her chest, on [phone] she underwent kyphoplasty and vitamin D supplementation, since then has noticed improvement chest pain. She was also found to have asymptomatic small pulmonary nodule. She continues to have mild dsypnea and states that this is a major concern now. She is accompanied by her daughter at the bedside.  Due to persistent atrial fibrillation, she was scheduled for cardioversion however she missed appointment. I brought her into the office to reevaluate her symptoms and also manage her blood sugar as I discontinued amlodipine and switched her to metoprolol both for atrial fibrillation and to see whether her leg edema would improve. She continues to have persistent leg edema. She takes furosemide on a p.r.n. basis. She has a moderate sized PFO by TEE on 02/29/2016.  Patient has Hypertension, hyperlipidemia but does not want to be on therapy for lipids. No history of diabetes. She does not smoke.     Problem List/Past Medical Bernell List; 2016-08-16 4:37 PM) Essential hypertension, benign (I10)  Chest pain (R07.9)  Lexiscan stress 06/12/12: 1. Resting EKG NSR, Stress EKG was non diagnostic for ischemia. No ST-T changes of ischemia noted with pharmacologic stress testing. Stress symptoms included light headedness and headache. Stress terminated due to completion of protocol. 2. The perfusion study demonstrated  normal isotope uptake both at rest and stress. There was no evidence of ischemia or scar. Dynamic gated images reveal normal wall motion and endocardial thickening. Left ventricular ejection fraction was estimated to be 61%. Mild mitral regurgitation (I34.0)  Hypercholesterolemia (E78.00)  PFO (patent foramen ovale) (Q21.1)  Echocardiogram 02/07/2016: Left ventricle cavity is normal in size. Normal global wall motion. Normal diastolic filling pattern. Calculated EF 63%. Left atrial cavity is normal in size. 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 Interatrial septum bulges to the left suggests elevated Right heart pressure. Mild (Grade I) mitral regurgitation. Mild tricuspid regurgitation. Mild pulmonary hypertension. Pulmonary artery systolic pressure is estimated at 36 mm Hg. IVC is dilated with normal respiratory variation. Suggests elevated right heart pressure. Compared to the study done on 01/25/2015, mild pulmonary hypertension is new. Previously right atrium and right ventricle were reported as borderline enlarged. BMI 30.0-30.9,adult (Z68.30)  Pulmonary hypertension, mild (V69.45)  PA systolic [id number] mmHg by echocardiogram on 02/07/2016. Bilateral leg edema (R60.0)  Persistent atrial fibrillation (I48.1) 05/27/2016 CHA2DS2-VASc score- 3 with yearly risk of stroke-3.2%.  Allergies Bernell List; 2016-08-16 4:37 PM) No Known Drug Allergies 06/02/2012  Family History Bernell List; 08/16/2016 4:37 PM) Mother  Deceased. doesn't know medical history. Father  Deceased. doesn't know medical history. Sister 1  living-no known heart conditions.  Social History Bernell List; 2016-08-16 4:37 PM) Current tobacco use  Never smoker. Non Drinker/No Alcohol Use  Marital status  Widowed. Living Situation  Lives with daughter. Number of Children  9.  Past Surgical History Bernell List; 08/16/16 4:37 PM) Cataract Extraction-Bilateral  2013  Medication History Jonelle Sidle Barton Dubois; 2016/08/16 4:37 PM) Diovan (320MG  Tablet, 1 (  one) Tablet Oral daily, Taken starting 07/10/2016) Active. (Please disreagard rx for Valsartan; PA Approved 07/03/2016-07/03/2017) Atorvastatin Calcium (20MG  Tablet, 1 Tablet Oral daily, Taken starting 07/03/2016) Active. Xarelto (20MG  Tablet, 1 (one) Tablet Oral daily, Taken starting 07/03/2016) Active. Metoprolol Succinate ER (50MG  Tablet ER 24HR, 1 (one) Tablet Oral daily, Taken starting 07/03/2016) Active. (Discontinue amlodipine and valsaratan combination) Furosemide (20MG  Tablet, 1 (one) Tablet Tablet Oral daily, Taken starting 05/28/2016) Active. Potassium Chloride ER (10MEQ Capsule ER, 1 Oral daily) Active. Amitriptyline HCl (10MG  Tablet, 1 Oral daily) Active. Drisdol (50000UNIT Capsule, 1 Oral weekly) Active.  Diagnostic Studies History Bernell List; 08/02/2016 4:37 PM) Echocardiogram  Echo 12/10/12: 1. Left ventricular cavity is normal in size. Normal diastolic filling. Normal global wall motion. Normal systolic global function. Calculated EF 60%. 2. Left atrial cavity is mildly dilated. 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 3. Right atrial cavity is slightly dilated. Right ventricular cavity is borderline enlarged. Normal global wall motion. 4. Mitral valve structurally normal. Trace mitral regurgitation. 5. Tricuspid valve structurally normal. Mild tricuspid regurgitation. No pulmonary hypertension. Nuclear stress test  Lexiscan stress 06/12/12: 1. Resting EKG NSR, Stress EKG was non diagnostic for ischemia. No ST-T changes of ischemia noted with pharmacologic stress testing. Stress symptoms included light headedness and headache. Stress terminated due to completion of protocol. 2. The perfusion study demonstrated normal isotope uptake both at rest and stress. There was no evidence of ischemia or scar. Dynamic gated  images reveal normal wall motion and endocardial thickening. Left ventricular ejection fraction was estimated to be 61%. Labwork  07/09/2016: Creatinine 0.75, potassium 4.7, BMP normal. TEE 02/29/2016 Chest X-ray 05/19/2016    Review of Systems Morgan Page MD; Aug 02, 2016 5:33 PM) General Not Present- Appetite Loss and Weight Gain. Respiratory Present- Difficulty Breathing on Exertion. Not Present- Chronic Cough and Wakes up from Sleep Wheezing or Short of Breath. Cardiovascular Present- Chest Pain (sharp) and Edema. Gastrointestinal Not Present- Black, Tarry Stool and Difficulty Swallowing. Musculoskeletal Not Present- Decreased Range of Motion and Muscle Atrophy. Neurological Not Present- Attention Deficit. Psychiatric Not Present- Personality Changes and Suicidal Ideation. Endocrine Not Present- Cold Intolerance and Heat Intolerance. Hematology Not Present- Abnormal Bleeding. All other systems negative  Vitals Bernell List; 2016/08/02 4:15 PM) August 02, 2016 4:15 PM Weight: 186.13 lb Height: 65in Body Surface Area: 1.92 m Body Mass Index: 30.97 kg/m  Pulse: 62 (Irregular)  P.OX: 96% (Room air) BP: 130/92 (Sitting, Left Arm, Standard)       Physical Exam Morgan Page MD; 08/02/16 5:32 PM) General Mental Status-Alert. General Appearance-Cooperative and Appears stated age. Build & Nutrition-Well nourished and Moderately built.  Head and Neck Thyroid Gland Characteristics - normal size and consistency and no palpable nodules.  Chest and Lung Exam Chest and lung exam reveals -quiet, even and easy respiratory effort with no use of accessory muscles, non-tender and on auscultation, normal breath sounds, no adventitious sounds.  Cardiovascular Cardiovascular examination reveals -carotid auscultation reveals no bruits, abdominal aorta auscultation reveals no bruits and no prominent pulsation, femoral artery auscultation bilaterally reveals  normal pulses, no bruits, no thrills and normal pedal pulses bilaterally. Auscultation Rhythm - Irregular. Heart Sounds - S1 is variable, S2 WNL and No gallop present. Murmurs & Other Heart Sounds - Murmur - No murmur.  Abdomen Palpation/Percussion Normal exam - Non Tender and No hepatosplenomegaly.  Peripheral Vascular Lower Extremity Palpation - Edema - Bilateral - 2+ Pitting edema.  Neurologic Neurologic evaluation reveals -  alert and oriented x 3 with no impairment of recent or remote memory. Motor-Grossly intact without any focal deficits.  Musculoskeletal Global Assessment Left Lower Extremity - no deformities, masses or tenderness, no known fractures. Right Lower Extremity - no deformities, masses or tenderness, no known fractures.    Assessment & Plan Morgan Page MD; 07/25/2016 4:56 PM) Persistent atrial fibrillation (I48.1) Story: CHA2DS2-VASc score- 3 with yearly risk of stroke-3.2%. Impression:   EKG 07/25/2016: Atrial fibrillation with controlled ventricular response at the rate of 66 bpm, normal axis.  No evidence of ischemia. No change from EKG 07/03/2016  Current Plans Complete electrocardiogram (93000) Bilateral leg edema (R60.0) Essential hypertension, benign (I10) Current Plans Started Triamterene-HCTZ 37.5-25MG , 1 (one) Tablet every morning, #60, 07/25/2016, Ref. x2. PFO (patent foramen ovale) (Q21.1) Story: Echocardiogram 02/07/2016: Left ventricle cavity is normal in size. Normal global wall motion. Normal diastolic filling pattern. Calculated EF 63%. Left atrial cavity is normal in size. 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 Interatrial septum bulges to the left suggests elevated Right heart pressure. Mild (Grade I) mitral regurgitation. Mild tricuspid regurgitation. Mild pulmonary hypertension. Pulmonary artery systolic pressure is estimated at 36 mm Hg. IVC is dilated with normal respiratory variation. Suggests  elevated right heart pressure. Compared to the study done on 01/25/2015, mild pulmonary hypertension is new. Previously right atrium and right ventricle were reported as borderline enlarged. Impression: TEE 02/29/2016: Normal LVEF. Interatrial septum is aneurysmal. Moderate sized PFO with left to right shunting by color Doppler and strongly positive right to left shunting with Valsalva by agitated saline injection. Otherwise normal TEE. Current Plans Mechanism of underlying disease process and action of medications discussed with the patient. I discussed primary/secondary prevention and also dietary counceling was done. Patient  was scheduled for direct current cardioversion 10 days ago, however she missed appointment. On her last office visit I had started her on low-dose of metoprolol succinate which she is tolerating, blood pressure is still elevated, I discontinued amlodipine due to leg edema. Leg edema persist, hence she may need lower extremity venous insufficiency study at some point. Blood pressure remains elevated, I have added Maxide 37.5/25 mg one p.o. q.a.m.  Due to dyspnea which may be related to new onset of atrial fibrillation, we will reschedule her direct current cardioversion. Her dyspnea may also be related to ASD and evidence of mild RV dilatation and right ventricular strain, if symptoms do not improve after cardioversion, the could certainly consider ASD closure. Her daughter is present the bedside, all questions answered.  Labs 08/01/2016: Serum glucose 97 mg, BUN 10, creatinine 0.78, K 4.0.  CC Dr. Nolene Ebbs.  Signed by Morgan Page, MD (07/25/2016 5:42 PM)

## 2016-08-06 ENCOUNTER — Ambulatory Visit (HOSPITAL_COMMUNITY): Payer: Medicaid Other

## 2016-08-06 ENCOUNTER — Encounter (HOSPITAL_COMMUNITY): Payer: Self-pay | Admitting: *Deleted

## 2016-08-06 ENCOUNTER — Ambulatory Visit (HOSPITAL_COMMUNITY)
Admission: RE | Admit: 2016-08-06 | Discharge: 2016-08-06 | Disposition: A | Discharge status: Home / Self Care | Payer: Medicaid Other | Source: Ambulatory Visit | Attending: Cardiology | Admitting: Cardiology

## 2016-08-06 ENCOUNTER — Ambulatory Visit (HOSPITAL_COMMUNITY): Payer: Medicaid Other | Admitting: Certified Registered Nurse Anesthetist

## 2016-08-06 ENCOUNTER — Ambulatory Visit (HOSPITAL_COMMUNITY)
Admission: RE | Admit: 2016-08-06 | Discharge: 2016-08-06 | Disposition: A | Discharge status: Home / Self Care | Payer: Medicaid Other | Source: Ambulatory Visit | Attending: Interventional Radiology | Admitting: Interventional Radiology

## 2016-08-06 ENCOUNTER — Encounter (HOSPITAL_COMMUNITY)
Admission: RE | Disposition: A | Discharge status: Home / Self Care | Payer: Self-pay | Source: Ambulatory Visit | Attending: Cardiology

## 2016-08-06 DIAGNOSIS — R600 Localized edema: Secondary | ICD-10-CM | POA: Insufficient documentation

## 2016-08-06 DIAGNOSIS — I272 Pulmonary hypertension, unspecified: Secondary | ICD-10-CM | POA: Insufficient documentation

## 2016-08-06 DIAGNOSIS — I4891 Unspecified atrial fibrillation: Secondary | ICD-10-CM | POA: Diagnosis present

## 2016-08-06 DIAGNOSIS — I10 Essential (primary) hypertension: Secondary | ICD-10-CM | POA: Insufficient documentation

## 2016-08-06 DIAGNOSIS — I481 Persistent atrial fibrillation: Secondary | ICD-10-CM | POA: Diagnosis not present

## 2016-08-06 DIAGNOSIS — I340 Nonrheumatic mitral (valve) insufficiency: Secondary | ICD-10-CM | POA: Diagnosis not present

## 2016-08-06 DIAGNOSIS — R0602 Shortness of breath: Secondary | ICD-10-CM | POA: Diagnosis not present

## 2016-08-06 DIAGNOSIS — E785 Hyperlipidemia, unspecified: Secondary | ICD-10-CM | POA: Diagnosis not present

## 2016-08-06 DIAGNOSIS — M4850XA Collapsed vertebra, not elsewhere classified, site unspecified, initial encounter for fracture: Principal | ICD-10-CM

## 2016-08-06 DIAGNOSIS — Z9842 Cataract extraction status, left eye: Secondary | ICD-10-CM | POA: Diagnosis not present

## 2016-08-06 DIAGNOSIS — Z7901 Long term (current) use of anticoagulants: Secondary | ICD-10-CM | POA: Insufficient documentation

## 2016-08-06 DIAGNOSIS — Q211 Atrial septal defect: Secondary | ICD-10-CM | POA: Diagnosis not present

## 2016-08-06 DIAGNOSIS — R911 Solitary pulmonary nodule: Secondary | ICD-10-CM | POA: Insufficient documentation

## 2016-08-06 DIAGNOSIS — Z9841 Cataract extraction status, right eye: Secondary | ICD-10-CM | POA: Insufficient documentation

## 2016-08-06 DIAGNOSIS — K219 Gastro-esophageal reflux disease without esophagitis: Secondary | ICD-10-CM | POA: Insufficient documentation

## 2016-08-06 DIAGNOSIS — IMO0001 Reserved for inherently not codable concepts without codable children: Secondary | ICD-10-CM

## 2016-08-06 DIAGNOSIS — Z79899 Other long term (current) drug therapy: Secondary | ICD-10-CM | POA: Diagnosis not present

## 2016-08-06 HISTORY — PX: CARDIOVERSION: SHX1299

## 2016-08-06 SURGERY — CARDIOVERSION
Anesthesia: General

## 2016-08-06 MED ORDER — PROPOFOL 10 MG/ML IV BOLUS
INTRAVENOUS | Status: DC | PRN
  Administered 2016-08-06: 20 mg via INTRAVENOUS
  Administered 2016-08-06: 60 mg via INTRAVENOUS

## 2016-08-06 MED ORDER — SODIUM CHLORIDE 0.9 % IV SOLN
INTRAVENOUS | Status: DC
  Administered 2016-08-06: 11:00:00 via INTRAVENOUS

## 2016-08-06 MED ORDER — LIDOCAINE 2% (20 MG/ML) 5 ML SYRINGE
INTRAMUSCULAR | Status: DC | PRN
  Administered 2016-08-06: 40 mg via INTRAVENOUS

## 2016-08-06 NOTE — Anesthesia Postprocedure Evaluation (Signed)
Anesthesia Post Note  Patient: Morgan Bowers  Procedure(s) Performed: Procedure(s) (LRB): CARDIOVERSION (N/A)  Patient location during evaluation: PACU Anesthesia Type: General Level of consciousness: awake Pain management: pain level controlled Vital Signs Assessment: post-procedure vital signs reviewed and stable Respiratory status: spontaneous breathing Cardiovascular status: stable Anesthetic complications: no       Last Vitals:  Vitals:   08/06/16 1220 08/06/16 1230  BP: 114/70 107/70  Pulse: 60 (!) 58  Resp: 18 16  Temp:      Last Pain:  Vitals:   08/06/16 1211  TempSrc: Oral                 Kashari Chalmers

## 2016-08-06 NOTE — CV Procedure (Signed)
Direct current cardioversion:  Indication symptomatic A. Fibrillation.  Procedure: Using 80 mg of IV Propofol and 40 IV Lidocaine (for reducing venous pain) for achieving deep sedation, synchronized direct current cardioversion performed. Patient was delivered with 100 Joules of electricity X 1 then 120 J x 2 with success to NSR. Patient tolerated the procedure well. No immediate complication noted.

## 2016-08-06 NOTE — Discharge Instructions (Signed)
Electrical Cardioversion, Care After °This sheet gives you information about how to care for yourself after your procedure. Your health care provider may also give you more specific instructions. If you have problems or questions, contact your health care provider. °What can I expect after the procedure? °After the procedure, it is common to have: °· Some redness on the skin where the shocks were given. °Follow these instructions at home: °· Do not drive for 24 hours if you were given a medicine to help you relax (sedative). °· Take over-the-counter and prescription medicines only as told by your health care provider. °· Ask your health care provider how to check your pulse. Check it often. °· Rest for 48 hours after the procedure or as told by your health care provider. °· Avoid or limit your caffeine use as told by your health care provider. °Contact a health care provider if: °· You feel like your heart is beating too quickly or your pulse is not regular. °· You have a serious muscle cramp that does not go away. °Get help right away if: °· You have discomfort in your chest. °· You are dizzy or you feel faint. °· You have trouble breathing or you are short of breath. °· Your speech is slurred. °· You have trouble moving an arm or leg on one side of your body. °· Your fingers or toes turn cold or blue. °This information is not intended to replace advice given to you by your health care provider. Make sure you discuss any questions you have with your health care provider. °Document Released: 02/17/2013 Document Revised: 12/01/2015 Document Reviewed: 11/03/2015 °Elsevier Interactive Patient Education © 2017 [employer] ° °

## 2016-08-06 NOTE — Transfer of Care (Signed)
Immediate Anesthesia Transfer of Care Note  Patient: Morgan Bowers  Procedure(s) Performed: Procedure(s): CARDIOVERSION (N/A)  Patient Location: Endoscopy Unit  Anesthesia Type:General  Level of Consciousness: awake and alert   Airway & Oxygen Therapy: Patient Spontanous Breathing  Post-op Assessment: Report given to RN and Post -op Vital signs reviewed and stable  Post vital signs: Reviewed and stable  Last Vitals:  Vitals:   08/06/16 1027  BP: 130/71  Pulse: 65  Resp: 18  Temp: 36.4 C    Last Pain:  Vitals:   08/06/16 1027  TempSrc: Oral         Complications: No apparent anesthesia complications

## 2016-08-06 NOTE — Anesthesia Preprocedure Evaluation (Signed)
Anesthesia Evaluation  Patient identified by MRN, date of birth, ID band Patient awake    Reviewed: Allergy & Precautions, NPO status , Patient's Chart, lab work & pertinent test results  Airway Mallampati: II  TM Distance: >3 FB     Dental   Pulmonary shortness of breath,    breath sounds clear to auscultation       Cardiovascular hypertension, + dysrhythmias Atrial Fibrillation  Rhythm:Irregular Rate:Normal     Neuro/Psych    GI/Hepatic Neg liver ROS, GERD  ,  Endo/Other  negative endocrine ROS  Renal/GU negative Renal ROS     Musculoskeletal   Abdominal   Peds  Hematology   Anesthesia Other Findings   Reproductive/Obstetrics                             Anesthesia Physical Anesthesia Plan  ASA: III  Anesthesia Plan: General   Post-op Pain Management:    Induction: Intravenous  Airway Management Planned: Mask and Simple Face Mask  Additional Equipment:   Intra-op Plan:   Post-operative Plan:   Informed Consent: I have reviewed the patients History and Physical, chart, labs and discussed the procedure including the risks, benefits and alternatives for the proposed anesthesia with the patient or authorized representative who has indicated his/her understanding and acceptance.   Dental advisory given  Plan Discussed with: CRNA, Anesthesiologist and Surgeon  Anesthesia Plan Comments:         Anesthesia Quick Evaluation

## 2016-08-06 NOTE — Interval H&P Note (Signed)
History and Physical Interval Note:  08/06/2016 11:52 AM  Morgan Bowers  has presented today for surgery, with the diagnosis of AFIB  The various methods of treatment have been discussed with the patient and family. After consideration of risks, benefits and other options for treatment, the patient has consented to  Procedure(s): CARDIOVERSION (N/A) as a surgical intervention .  The patient's history has been reviewed, patient examined, no change in status, stable for surgery.  I have reviewed the patient's chart and labs.  Questions were answered to the patient's satisfaction.     [name]

## 2016-08-07 ENCOUNTER — Encounter (HOSPITAL_COMMUNITY): Payer: Self-pay | Admitting: Cardiology

## 2016-09-02 NOTE — H&P (Signed)
OFFICE VISIT NOTES COPIED TO EPIC FOR DOCUMENTATION  . History of Present Illness Morgan Bowers K. Vyas MD; 09-06-16 8:29 PM) Patient words: Last O/V 07/25/2016; F/U Cardioversion.  The patient is a 71 year old female who presents for a Follow-up for Atrial fibrillation. Morgan Bowers is a pleasant African American female. Patient had cardioversion on 08/06/2016, she had converted to sinus rhythm. She is feeling better. No complaints of palpitation, heart racing or irregular heartbeat. No dizziness, near-syncope or syncope. Dyspnea has improved from before, but, continues to have exertional dyspnea like going upstairs. No orthopnea or PND. No complaints of chest pain. She has mild swelling on the legs.  No history of hematuria, GI bleed or bleeding from any other site. No history of excessive bruising.  Patient has Hypertension, well controlled with therapy. She also has hyperlipidemia, on atorvastatin therapy. No history of diabetes. She does not smoke.   Patient has chronic low backache and has been on pain medications. She had MRI recently, results are pending.    Problem List/Past Medical Morgan Bowers; 2016-09-06 3:48 PM) Essential hypertension, benign (I10)  Mild mitral regurgitation (I34.0)  Hypercholesterolemia (E78.00)  PFO (patent foramen ovale) (Q21.1)  Echocardiogram 02/07/2016: Left ventricle cavity is normal in size. Normal global wall motion. Normal diastolic filling pattern. Calculated EF 63%. Left atrial cavity is normal in size. 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 Interatrial septum bulges to the left suggests elevated Right heart pressure. Mild (Grade I) mitral regurgitation. Mild tricuspid regurgitation. Mild pulmonary hypertension. Pulmonary artery systolic pressure is estimated at 36 mm Hg. IVC is dilated with normal respiratory variation. Suggests elevated right heart pressure. Compared to the study done on 01/25/2015, mild pulmonary  hypertension is new. Previously right atrium and right ventricle were reported as borderline enlarged. BMI 30.0-30.9,adult (Z68.30)  Pulmonary hypertension, mild (G64.40)  PA systolic [id number]-63 mmHg by echocardiogram on 02/07/2016. Bilateral leg edema (R60.0)  Persistent atrial fibrillation (I48.1) 05/27/2016 CHA2DS2-VASc score- 3 with yearly risk of stroke-3.2%.  Allergies Morgan Bowers; 2016/09/06 3:48 PM) No Known Drug Allergies 06/02/2012  Family History Morgan Bowers; 09-06-2016 3:48 PM) Mother  Deceased. doesn't know medical history. Father  Deceased. doesn't know medical history. Sister 1  living-no known heart conditions.  Social History Morgan Bowers; 09-06-2016 3:48 PM) Current tobacco use  Never smoker. Non Drinker/No Alcohol Use  Marital status  Widowed. Living Situation  Lives with daughter. Number of Children  9.  Past Surgical History Morgan Bowers; 09-06-2016 3:48 PM) Cataract Extraction-Bilateral 2013  Medication History Morgan Hick, MD; 09/06/16 7:56 PM) Triamterene-HCTZ (37.5-25MG  Tablet, 1 (one) Tablet Oral every morning, Taken starting 07/25/2016) Active. Diovan (320MG  Tablet, 1 (one) Tablet Tablet Oral daily, Taken starting 07/10/2016) Active. (Please disreagard rx for Valsartan; PA Approved 07/03/2016-07/03/2017) Atorvastatin Calcium (20MG  Tablet, 1 Tablet Tablet Oral daily, Taken starting 07/03/2016) Active. Xarelto (20MG  Tablet, 1 (one) Tablet Tablet Oral daily, Taken starting 07/03/2016) Active. Metoprolol Succinate ER (50MG  Tablet ER 24HR, 1 (one) Tablet Tablet Oral daily, Taken starting 07/03/2016) Active. (Discontinue amlodipine and valsaratan combination) Furosemide (20MG  Tablet, Oral as directed, Taken starting 05/28/2016) Active. (Take as needed if swelling on legs is worse.) Potassium Chloride ER (10MEQ Capsule ER, 1 Oral daily) Discontinued. (Pt. is on Triamterene/HCTZ and also Diovan) Drisdol (50000UNIT Capsule, 1  Oral weekly) Active. Hydrocodone-Acetaminophen (5-325MG  Tablet, 1 Oral as needed) Active. HydroCHLOROthiazide (12.5MG  Tablet, 1 Oral daily) Discontinued. (Pt. is taking Triamterene/HCTZ) Medications Reconciled (verbally with pt)  Diagnostic Studies History Morgan Bowers; 09/06/2016 3:57 PM) Cardioversion 08/06/2016  Procedure: Using 80 mg of IV Propofol and 40 IV Lidocaine (for reducing venous pain) for achieving deep sedation, synchronized direct current cardioversion performed. Patient was delivered with 100 Joules of electricity X 1 then 120 J x 2 with success to NSR. Patient tolerated the procedure well. No immediate complication noted. MRI, Spine 08/06/2016 results pending    Review of Systems Morgan Bowers K. Vyas MD; 08/12/2016 8:29 PM)  Note: GENERAL- Not feeling tired or fatigue, No fever, chills. CARDIO VASCULAR- No chest pain, Has exertional shortness of breath, No orthopnea or PND. No palpitation, Has dizziness, No fainting. Has h/o hypertension. Has h/o high cholesterol. Mild chronic swelling on legs. No claudication in legs, No cramps. No h/o DVT. Has h/o varicose veins. PULMONARY- No cough, phlegm, wheezing, not feeling congested in chest. GASTROINTESTINAL- No abdominal pain, nausea, vomiting or diarrhea. No dark tarry stools. Normal appetite. ENDOCRINE- No Thyroid problem, No feeling of excessive heat or cold, No polydipsia or polyuria. No Diabetes. MUSCULOSKELETAL- No generalized myalgias or muscle weakness. No joint swelling SKIN- No skin rash, No pruritus HEMATOLOGY- Has h/o mild anemia, No petechiae, excessive bruising, epistaxis, GI bleed or any abnormal bleeding.   Vitals Morgan Bowers; 08/12/2016 4:09 PM) 08/12/2016 3:54 PM Weight: 178.56 lb Height: 65in Body Surface Area: 1.89 m Body Mass Index: 29.71 kg/m  Pulse: 58 (Regular)  P.OX: 95% (Room air) BP: 111/59 (Sitting, Left Arm, Standard)       Physical Exam Morgan Bowers K. Vyas MD; 08/12/2016  8:28 PM) The physical exam findings are as follows: Note:GENERAL APPEARANCE- Alert, Oriented. NECK- No JVD. Carotid pulses are 2+, No bruits audible. No thyromegaly. No lymphadenopathy. HEART- Auscultation- Normal S1, S2. S4 gallop at apex. Gr. 1/6 midsystolic murmur is audible at the apex and LPSA. CHEST- Normal shape. Normal percussion. Auscultation- Normal breath sounds, No crepitations. No wheezing. ABDOMEN- Palpation- Soft, Nontender. No hepatosplenomegaly. No masses felt. Auscultation- No bruits audible. EXTREMITIES- No Clubbing or Cyanosis. Chronic nonpitting edema on legs. Pt. has varicosities. PERIPHERAL PULSES- Both dorsalis pedis pulses- 2+, Both posterior tibial pulses- 2+    Assessment & Plan Morgan Bowers K. Vyas MD; 08/12/2016 8:32 PM) History of atrial fibrillation (Z86.79) Story: Cardioverted to sinus rhythm on 08/06/2016. Presently in sinus rhytm. CHA2DS2-VASc score- 3 with yearly risk of stroke-3.2%. Current Plans Complete electrocardiogram (93000) PFO (patent foramen ovale) (Q21.1) Story: TEE- 08/06/2016-shows PFO. Agitated saline contrast study revealed large right-to-left atrial level shunt following increase in the right atrial pressure induced by provocative maneuvers. Color Doppler showed small left-to-right shunt in baseline state. Echocardiogram 02/07/2016: Left ventricle cavity is normal in size. Normal global wall motion. Normal diastolic filling pattern. Calculated EF 63%. Left atrial cavity is normal in size. 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 Interatrial septum bulges to the left suggests elevated Right heart pressure. Mild (Grade I) mitral regurgitation. Mild tricuspid regurgitation. Mild pulmonary hypertension. Pulmonary artery systolic pressure is estimated at 36 mm Hg. IVC is dilated with normal respiratory variation. Suggests elevated right heart pressure. Compared to the study done on 01/25/2015, mild pulmonary  hypertension is new. Previously right atrium and right ventricle were reported as borderline enlarged. Impression: TEE 02/29/2016: Normal LVEF. Interatrial septum is aneurysmal. Moderate sized PFO with left to right shunting by color Doppler and strongly positive right to left shunting with Valsalva by agitated saline injection. Otherwise normal TEE. Future Plans 08/19/1446: METABOLIC PANEL, BASIC (18563) - one time 08/26/2016: CBC & PLATELETS (AUTO) (14970) - one time 08/26/2016: PT (PROTHROMBIN TIME) (26378) - one time  Essential hypertension, benign (I10) Bilateral leg edema (R60.0)  Note:EKG- Sinus rhythm, PACs, first degree AV block left atrial abnormality.  Patient had successful cardioversion and has been staying in sinus rhythm. Blood pressure is controlled with therapy. I have advised her to continue present antihypertensive medications and continue Xarelto. She was advised to stop hydrochlorothiazide as she is taking triamterene/HCTZ. She was also advised to stop potassium. Side effects of Xarelto-bleeding complications were again explained to the patient and her daughter who is accompanying her.  Patient has moderate sized PFO. See the results of TEE and TTE above. In view of right heart strain and pulmonary hypertension, she had atrial fibrillation recently, which could be related to this also. I have recommended closure of PFO. Indications, procedure and possible complications were explained. Patient is agreeable. She will be scheduled for the procedure. Patient was advised to stop Xarelto 2 days before the procedure. Start back after the PFO closure.  Primary prevention was again discussed. She was advised to follow low-salt, low-cholesterol diet. She was also encouraged to walk regularly as tolerated.  I will see her in follow-up after the PFO closure.  CC: Dr. Nolene Ebbs.  Signed electronically by Morgan Hick, MD (08/12/2016 8:33 PM)

## 2016-09-03 ENCOUNTER — Encounter (HOSPITAL_COMMUNITY): Payer: Self-pay | Admitting: General Practice

## 2016-09-03 ENCOUNTER — Ambulatory Visit (HOSPITAL_COMMUNITY)
Admission: RE | Admit: 2016-09-03 | Discharge: 2016-09-04 | Disposition: A | Discharge status: Home / Self Care | Payer: Medicaid Other | Source: Ambulatory Visit | Attending: Cardiology | Admitting: Cardiology

## 2016-09-03 ENCOUNTER — Encounter (HOSPITAL_COMMUNITY)
Admission: RE | Disposition: A | Discharge status: Home / Self Care | Payer: Self-pay | Source: Ambulatory Visit | Attending: Cardiology

## 2016-09-03 DIAGNOSIS — I272 Pulmonary hypertension, unspecified: Secondary | ICD-10-CM | POA: Insufficient documentation

## 2016-09-03 DIAGNOSIS — I340 Nonrheumatic mitral (valve) insufficiency: Secondary | ICD-10-CM | POA: Insufficient documentation

## 2016-09-03 DIAGNOSIS — R600 Localized edema: Secondary | ICD-10-CM | POA: Insufficient documentation

## 2016-09-03 DIAGNOSIS — Z7901 Long term (current) use of anticoagulants: Secondary | ICD-10-CM | POA: Insufficient documentation

## 2016-09-03 DIAGNOSIS — E7800 Pure hypercholesterolemia, unspecified: Secondary | ICD-10-CM | POA: Insufficient documentation

## 2016-09-03 DIAGNOSIS — Z8774 Personal history of (corrected) congenital malformations of heart and circulatory system: Secondary | ICD-10-CM

## 2016-09-03 DIAGNOSIS — Q211 Atrial septal defect: Secondary | ICD-10-CM | POA: Diagnosis present

## 2016-09-03 DIAGNOSIS — I10 Essential (primary) hypertension: Secondary | ICD-10-CM | POA: Insufficient documentation

## 2016-09-03 HISTORY — DX: Pure hypercholesterolemia, unspecified: E78.00

## 2016-09-03 HISTORY — PX: PATENT FORAMEN OVALE CLOSURE: SHX2181

## 2016-09-03 HISTORY — PX: PATENT FORAMEN OVALE(PFO) CLOSURE: CATH118300

## 2016-09-03 HISTORY — DX: Unspecified osteoarthritis, unspecified site: M19.90

## 2016-09-03 LAB — POCT ACTIVATED CLOTTING TIME
ACTIVATED CLOTTING TIME: 208 s
ACTIVATED CLOTTING TIME: 230 s
Activated Clotting Time: 246 seconds
Activated Clotting Time: 197 seconds

## 2016-09-03 SURGERY — PATENT FORAMEN OVALE (PFO) CLOSURE
Anesthesia: LOCAL

## 2016-09-03 MED ORDER — HEPARIN SODIUM (PORCINE) 1000 UNIT/ML IJ SOLN
INTRAMUSCULAR | Status: AC
  Filled 2016-09-03: qty 1

## 2016-09-03 MED ORDER — SODIUM CHLORIDE 0.9 % IV SOLN
250.0000 mL | INTRAVENOUS | Status: DC | PRN
Start: 2016-09-03 — End: 2016-09-03

## 2016-09-03 MED ORDER — HEPARIN SODIUM (PORCINE) 1000 UNIT/ML IJ SOLN
INTRAMUSCULAR | Status: DC | PRN
  Administered 2016-09-03: 8000 [IU] via INTRAVENOUS
  Administered 2016-09-03 (×2): 3000 [IU] via INTRAVENOUS

## 2016-09-03 MED ORDER — LIDOCAINE HCL (PF) 1 % IJ SOLN
INTRAMUSCULAR | Status: AC
  Filled 2016-09-03: qty 30

## 2016-09-03 MED ORDER — POTASSIUM CHLORIDE CRYS ER 20 MEQ PO TBCR
10.0000 meq | EXTENDED_RELEASE_TABLET | Freq: Every day | ORAL | Status: DC
  Administered 2016-09-03 – 2016-09-04 (×2): 10 meq via ORAL
  Filled 2016-09-03 (×2): qty 1

## 2016-09-03 MED ORDER — SODIUM CHLORIDE 0.9% FLUSH: 3.0000 mL | Freq: Two times a day (BID) | INTRAVENOUS | Status: DC

## 2016-09-03 MED ORDER — ADULT MULTIVITAMIN W/MINERALS CH
1.0000 | ORAL_TABLET | Freq: Every day | ORAL | Status: DC
Start: 2016-09-03 — End: 2016-09-04
  Administered 2016-09-03 – 2016-09-04 (×2): 1 via ORAL
  Filled 2016-09-03 (×2): qty 1

## 2016-09-03 MED ORDER — ASPIRIN 81 MG PO CHEW
CHEWABLE_TABLET | ORAL | Status: AC
  Filled 2016-09-03: qty 1

## 2016-09-03 MED ORDER — MIDAZOLAM HCL 2 MG/2ML IJ SOLN
INTRAMUSCULAR | Status: AC
  Filled 2016-09-03: qty 2

## 2016-09-03 MED ORDER — SODIUM CHLORIDE 0.9% FLUSH: 3.0000 mL | INTRAVENOUS | Status: DC | PRN

## 2016-09-03 MED ORDER — ASPIRIN 81 MG PO CHEW
81.0000 mg | CHEWABLE_TABLET | ORAL | Status: AC
  Administered 2016-09-03: 81 mg via ORAL

## 2016-09-03 MED ORDER — ATORVASTATIN CALCIUM 20 MG PO TABS
20.0000 mg | ORAL_TABLET | Freq: Every day | ORAL | Status: DC
  Administered 2016-09-03 – 2016-09-04 (×2): 20 mg via ORAL
  Filled 2016-09-03 (×2): qty 1

## 2016-09-03 MED ORDER — FUROSEMIDE 20 MG PO TABS
20.0000 mg | ORAL_TABLET | Freq: Every day | ORAL | Status: DC
  Administered 2016-09-03 – 2016-09-04 (×2): 20 mg via ORAL
  Filled 2016-09-03 (×2): qty 1

## 2016-09-03 MED ORDER — CLOPIDOGREL BISULFATE 75 MG PO TABS
300.0000 mg | ORAL_TABLET | Freq: Once | ORAL | Status: AC
  Administered 2016-09-03: 300 mg via ORAL
  Filled 2016-09-03: qty 4

## 2016-09-03 MED ORDER — HYDROMORPHONE HCL 1 MG/ML IJ SOLN
INTRAMUSCULAR | Status: AC
  Filled 2016-09-03: qty 1

## 2016-09-03 MED ORDER — CEFAZOLIN SODIUM-DEXTROSE 2-3 GM-% IV SOLR
INTRAVENOUS | Status: DC | PRN
  Administered 2016-09-03: 2 g via INTRAVENOUS

## 2016-09-03 MED ORDER — METOPROLOL SUCCINATE ER 25 MG PO TB24
50.0000 mg | ORAL_TABLET | Freq: Every day | ORAL | Status: DC
  Administered 2016-09-04: 50 mg via ORAL
  Filled 2016-09-03: qty 2

## 2016-09-03 MED ORDER — ONDANSETRON HCL 4 MG/2ML IJ SOLN: 4.0000 mg | Freq: Four times a day (QID) | INTRAMUSCULAR | Status: DC | PRN

## 2016-09-03 MED ORDER — ACETAMINOPHEN 325 MG PO TABS: 650.0000 mg | ORAL_TABLET | Freq: Every day | ORAL | Status: DC | PRN

## 2016-09-03 MED ORDER — SODIUM CHLORIDE 0.9 % IV SOLN
250.0000 mL | INTRAVENOUS | Status: DC | PRN
Start: 2016-09-03 — End: 2016-09-04

## 2016-09-03 MED ORDER — CEFAZOLIN SODIUM-DEXTROSE 2-4 GM/100ML-% IV SOLN
INTRAVENOUS | Status: AC
  Filled 2016-09-03: qty 100

## 2016-09-03 MED ORDER — VITAMIN D (ERGOCALCIFEROL) 1.25 MG (50000 UNIT) PO CAPS
50000.0000 [IU] | ORAL_CAPSULE | ORAL | Status: DC
  Administered 2016-09-04: 10:00:00 50000 [IU] via ORAL
  Filled 2016-09-03: qty 1

## 2016-09-03 MED ORDER — HYDROMORPHONE HCL 1 MG/ML IJ SOLN
INTRAMUSCULAR | Status: DC | PRN
  Administered 2016-09-03 (×2): 0.5 mg via INTRAVENOUS

## 2016-09-03 MED ORDER — SODIUM CHLORIDE 0.9% FLUSH
3.0000 mL | Freq: Two times a day (BID) | INTRAVENOUS | Status: DC
  Administered 2016-09-04: 3 mL via INTRAVENOUS

## 2016-09-03 MED ORDER — IRBESARTAN 75 MG PO TABS
75.0000 mg | ORAL_TABLET | Freq: Every day | ORAL | Status: DC
  Administered 2016-09-03 – 2016-09-04 (×2): 75 mg via ORAL
  Filled 2016-09-03 (×2): qty 1

## 2016-09-03 MED ORDER — RIVAROXABAN 20 MG PO TABS: 20.0000 mg | ORAL_TABLET | Freq: Every day | ORAL | Status: DC

## 2016-09-03 MED ORDER — LIDOCAINE HCL (PF) 1 % IJ SOLN
INTRAMUSCULAR | Status: DC | PRN
  Administered 2016-09-03: 18 mL

## 2016-09-03 MED ORDER — HEPARIN (PORCINE) IN NACL 2-0.9 UNIT/ML-% IJ SOLN
INTRAMUSCULAR | Status: DC | PRN
  Administered 2016-09-03: 1000 mL

## 2016-09-03 MED ORDER — MIDAZOLAM HCL 2 MG/2ML IJ SOLN
INTRAMUSCULAR | Status: DC | PRN
  Administered 2016-09-03: 2 mg via INTRAVENOUS

## 2016-09-03 MED ORDER — CLOPIDOGREL BISULFATE 75 MG PO TABS
75.0000 mg | ORAL_TABLET | Freq: Every day | ORAL | Status: DC
  Administered 2016-09-04: 75 mg via ORAL
  Filled 2016-09-03: qty 1

## 2016-09-03 MED ORDER — HEPARIN (PORCINE) IN NACL 2-0.9 UNIT/ML-% IJ SOLN
INTRAMUSCULAR | Status: AC
  Filled 2016-09-03: qty 1000

## 2016-09-03 MED ORDER — TRAMADOL HCL 50 MG PO TABS: 50.0000 mg | ORAL_TABLET | Freq: Two times a day (BID) | ORAL | Status: DC | PRN

## 2016-09-03 MED ORDER — SODIUM CHLORIDE 0.9 % WEIGHT BASED INFUSION: 1.0000 mL/kg/h | INTRAVENOUS | Status: DC

## 2016-09-03 MED ORDER — SODIUM CHLORIDE 0.9 % WEIGHT BASED INFUSION
3.0000 mL/kg/h | INTRAVENOUS | Status: DC
  Administered 2016-09-03: 3 mL/kg/h via INTRAVENOUS

## 2016-09-03 MED ORDER — ACETAMINOPHEN 325 MG PO TABS
650.0000 mg | ORAL_TABLET | Freq: Once | ORAL | Status: AC
Start: 2016-09-03 — End: 2016-09-03
  Administered 2016-09-03: 650 mg via ORAL

## 2016-09-03 MED ORDER — ASPIRIN 81 MG PO CHEW
81.0000 mg | CHEWABLE_TABLET | Freq: Every day | ORAL | Status: DC
  Administered 2016-09-04: 81 mg via ORAL
  Filled 2016-09-03: qty 1

## 2016-09-03 MED ORDER — ACETAMINOPHEN 325 MG PO TABS
ORAL_TABLET | ORAL | Status: AC
  Administered 2016-09-03: 650 mg via ORAL
  Filled 2016-09-03: qty 2

## 2016-09-03 MED ORDER — IOPAMIDOL (ISOVUE-370) INJECTION 76%
INTRAVENOUS | Status: AC
  Filled 2016-09-03: qty 50

## 2016-09-03 SURGICAL SUPPLY — 17 items
BALLN SIZING AMPLATZER 24 (BALLOONS) ×2
BALLOON SIZING AMPLATZER 24 (BALLOONS) ×1 IMPLANT
BALN SZ 70X4.5 7FR 3 LUM (BALLOONS) ×1
CATH ACUNAV REPROCESSED (CATHETERS) ×2 IMPLANT
COVER SWIFTLINK CONNECTOR (BAG) ×2 IMPLANT
GUIDEWIRE AMPLATZER 1.5JX260 (WIRE) ×2 IMPLANT
KIT MICROINTRODUCER STIFF 5F (SHEATH) ×2 IMPLANT
OCCLUDER AMPLATZER PFO 25MM (Prosthesis & Implant Heart) ×2 IMPLANT
PACK CARDIAC CATHETERIZATION (CUSTOM PROCEDURE TRAY) ×2 IMPLANT
PROTECTION STATION PRESSURIZED (MISCELLANEOUS) ×2
SHEATH PINNACLE 8F 10CM (SHEATH) ×2 IMPLANT
SHEATH PINNACLE 9F 10CM (SHEATH) ×2 IMPLANT
STATION PROTECTION PRESSURIZED (MISCELLANEOUS) ×1 IMPLANT
SYSTEM DELIVERY AMPLATZER 8FR (SHEATH) ×2 IMPLANT
TRANSDUCER W/STOPCOCK (MISCELLANEOUS) IMPLANT
TUBING CIL FLEX 10 FLL-RA (TUBING) ×2 IMPLANT
WIRE EMERALD 3MM-J .035X150CM (WIRE) ×2 IMPLANT

## 2016-09-03 NOTE — Interval H&P Note (Signed)
History and Physical Interval Note:  09/03/2016 9:37 AM  Morgan Bowers  has presented today for surgery, with the diagnosis of pfo - asd  The various methods of treatment have been discussed with the patient and family. After consideration of risks, benefits and other options for treatment, the patient has consented to  Procedure(s): Patent Forament Ovale(PFO) Closure (N/A) as a surgical intervention .  The patient's history has been reviewed, patient examined, no change in status, stable for surgery.  I have reviewed the patient's chart and labs.  Questions were answered to the patient's satisfaction.   Patient had TEE that showed a very large right-to-left shunting, patient has RV dilatation and no other etiology for RV strain. Double contrast revealed severe/strongly positive right to left shunting with entire opacification of the aorta. 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 She also has a small fenestrated atrial septum.  [name]

## 2016-09-03 NOTE — Care Management Note (Signed)
Case Management Note  Patient Details  Name: Morgan Bowers MRN: [id number] Date of Birth: 1945-08-18  Subjective/Objective:    s/p  Patent Forament Ovale(PFO) Closure               Action/Plan: NCM will follow for dc needs.  Expected Discharge Date:                  Expected Discharge Plan:     In-House Referral:     Discharge planning Services  CM Consult  Post Acute Care Choice:    Choice offered to:     DME Arranged:    DME Agency:     HH Arranged:    HH Agency:     Status of Service:  In process, will continue to follow  If discussed at Long Length of Stay Meetings, dates discussed:    Additional Comments:  [name], RN 09/03/2016, 3:27 PM

## 2016-09-03 NOTE — Progress Notes (Addendum)
Site area: RFV x 2 Site Prior to Removal:  Level 0 Pressure Applied For: 120 Manual: yes   Patient Status During Pull: stable  Post Pull Site:  Level 0 Post Pull Instructions Given:  yes Post Pull Pulses Present: 2+ rt dp Dressing Applied:  tegaderm and a 4x4 Bedrest begins @ 14:30:00 Comments: pulled per order Dr Einar Gip with ACT 197 due to significant ooz at sites. Tegaderm and a 4x4 applied to rt groin. Rt groin has a slight bruise from sheath placement, but no hematoma.Pt leaves cath lab holding in stable condition. Dressing to rt groin is CDI.

## 2016-09-04 ENCOUNTER — Ambulatory Visit (HOSPITAL_COMMUNITY): Payer: Medicaid Other

## 2016-09-04 ENCOUNTER — Encounter (HOSPITAL_COMMUNITY): Payer: Self-pay | Admitting: Cardiology

## 2016-09-04 DIAGNOSIS — Q211 Atrial septal defect: Secondary | ICD-10-CM | POA: Diagnosis not present

## 2016-09-04 LAB — ECHOCARDIOGRAM COMPLETE
CHL CUP RV SYS PRESS: 38 mmHg
CHL CUP TV REG PEAK VELOCITY: 297 cm/s
Height: 66 in
TR max vel: 297 cm/s
WEIGHTICAEL: 2730.18 [oz_av]

## 2016-09-04 MED ORDER — ASPIRIN 81 MG PO CHEW: 81.0000 mg | CHEWABLE_TABLET | Freq: Every day | ORAL | Status: DC

## 2016-09-04 NOTE — Progress Notes (Signed)
  Echocardiogram 2D Echocardiogram has been performed.  Susannah Carbin T Chanelle Hodsdon 09/04/2016, 9:26 AM

## 2016-09-04 NOTE — Discharge Summary (Signed)
Physician Discharge Summary  Patient ID: Morgan Bowers MRN: [id number] DOB/AGE: 1946/05/01 71 y.o.  Admit date: 09/03/2016 Discharge date: 09/04/2016  Primary Discharge Diagnosis: S/P PFO closure 09/03/2016   Secondary Discharge Diagnosis: 1. Essential Hypertension                                                            2. Mild mitral regurgitation                                                            3. Hypercholesteremia                                                            4. Pulmonary hypertension, mild                                                              5.Bilateral leg edema                                                               6.History of atrial fibrillation S/P Cardioversion01/15/2018 CHA2DS2-VASc score- 3 with yearly risk of stroke-3.2%. Significant Diagnostic Studies:  Right heart catheterization 09/03/2016: There is a PFO. Agitated saline study indicates right-to-left shunt at rest and right-to-left shunt with Valsalva. Balloon sizing was performed. PFO defect size: 9mm. Intracardiac echo used for procedural guidance. 25 mm Amplatzer PFO occluder was used to close the defect. Agitated saline study indicates no right-to-left shunt post closure. There were no immediate procedural complications.   TEE 02/29/2016: Left ventricle: Systolic function was normal. The estimated   ejection fraction was in the range of 55% to 65%. Wall motion was   normal; there were no regional wall motion abnormalities. - Left atrium: No evidence of thrombus in the atrial cavity or   appendage. - Right atrium: No evidence of thrombus in the atrial cavity or   appendage. - Atrial septum: There was a patent foramen ovale. Agitated saline   contrast study showed a large right-to-left atrial level shunt,   following an increase in RA pressure induced by provocative   maneuvers. Doppler showed a small left-to-right shunt, in the   baseline state.  Hospital Course:  Patient who presents with worsening shortness of breath and dyspnea on exertion. Transthoracic had revealed normal LV systolic function, evidence of right heart strain with right 22 enlargement, presence of mild to moderate pulmonary hypertension. She underwent TEE to evaluate for atrial septal defect, this revealed a very large PFO with  strongly positive right to left shunting both in rest and with Valsalva strain. There was complete opacification of the aortic root with double contrast injection. It was felt that PFO was the etiology for her dyspnea, hence referred to me for closure of the same   She underwent Successful closure of the atrial septal defect on 09/03/2016 under intracardiac echo guidance with implantation of a 25 millimeter septal occluder. Post-deployment double contrast study revealed no residual shunting. She was kept overnight for close observation.   Patient is presently doing well, no complaints this morning. States she is ready to go home. No events overnight. Borderline hypotensive this morning, but asymptomatic. Will be stable for discharge today.    Recommendations on discharge: Patient will continue with dual antiplatelet therapy for a period of 30 days to 3 months. She'll also need endocarditis prophylaxis for repair of 6 months. After that patient can be on aspirin indefinitely or Plavix indefinitely depending on clinical circumstances. She will follow up in 7 to 10 days in office for re-evaluation.   Discharge Exam: Blood pressure 115/72, pulse 60, temperature 98.6 F (37 C), temperature source Oral, resp. rate (!) 21, height 5\' 6"  (1.676 m), weight 77.4 kg (170 lb 10.2 oz), SpO2 97 %.    General appearance: alert, cooperative, appears stated age and no distress Neck: no adenopathy, no carotid bruit, no JVD, supple, symmetrical, trachea midline and thyroid not enlarged, symmetric, no tenderness/mass/nodules Resp: clear to auscultation bilaterally Cardio: regular rate and  rhythm, S1, S2 normal, S4 present and systolic murmur: midsystolic 1/6,  at apex GI: soft, non-tender. No hepatosplenomegaly. No masses felt. No bruits.  Extremities: no clubbing or cyanosis. Chronic non-pitting edema on legs. Varicosities present Pulses: bilateral dorsalis pedal pulses 2+. Post tibial pulses 2 + Neurologic: AAOx3 Incision/Wound: right femoral cath access site: no hematoma, drainage, or bleeding. Ecchymosis and slight tenderness present. Dsg dry and intact. Labs:   Lab Results  Component Value Date   WBC 3.0 (L) 07/08/2016   HGB 10.8 (L) 07/08/2016   HCT 33.2 (L) 07/08/2016   MCV 94.9 07/08/2016   PLT 157 07/08/2016   No results for input(s): NA, K, CL, CO2, BUN, CREATININE, CALCIUM, PROT, BILITOT, ALKPHOS, ALT, AST, GLUCOSE in the last 168 hours.  Invalid input(s): LABALBU  Lipid Panel     Component Value Date/Time   CHOL 191 10/25/2010 0538   TRIG 27 10/25/2010 0538   HDL 90 10/25/2010 0538   CHOLHDL 2.1 10/25/2010 0538   VLDL 5 10/25/2010 0538   LDLCALC 96 10/25/2010 0538    BNP (last 3 results) No results for input(s): BNP in the last 8760 hours.  HEMOGLOBIN A1C No results found for: HGBA1C, MPG  Cardiac Panel (last 3 results) No results for input(s): CKTOTAL, CKMB, TROPONINI, RELINDX in the last 8760 hours.  Lab Results  Component Value Date   CKTOTAL 112 10/25/2010   CKMB 1.8 10/25/2010   TROPONINI <0.03 07/08/2015     TSH No results for input(s): TSH in the last 8760 hours..  Telemetry monitor: Sinus rhythm at 76 bpm  Radiology: Mr Thoracic Spine Wo Contrast  Result Date: 08/06/2016 CLINICAL DATA:  Sharp pain in the thoracic spine when recumbent. Duration of symptoms 3 months. EXAM: MRI THORACIC SPINE WITHOUT CONTRAST TECHNIQUE: Multiplanar, multisequence MR imaging of the thoracic spine was performed. No intravenous contrast was administered. COMPARISON:  CT 06/16/2016. MRI 06/17/2016. Vertebral augmentation images 2 11/2016. FINDINGS:  Alignment:  Alignment remains normal. Vertebrae: Newly seen edema throughout the  T6 and T7 vertebral bodies consistent with acute or subacute compression fractures at those levels. Loss of height only 20% at T6 and 30% at T7. No retropulsed bone. Previously augmented fractures at T8 and T9 show methylmethacrylate well distributed. No further collapse. There is continued edema at those levels consistent with ongoing healing. No canal compromise. No fracture seen below that. Cord:  Wide patency of the canal.  No cord lesion. Paraspinal and other soft tissues: Otherwise negative Disc levels: No evidence of degenerative disc disease, bulge or herniation. IMPRESSION: Previously augmented fractures at T8 and T9. No complication of the procedure. No further collapse. Persistent edema consistent with ongoing healing. New compression fractures at T6 and T7. 20% loss of height at T6 and 30% at T7. No retropulsed bone. Edema pattern is nonspecific, presumed benign. There are no marrow space lesions seen at any vertebra not showing compression. Insignificant benign superior endplate Schmorl's node at T11 as seen previously. Electronically Signed   By: Nelson Chimes M.D.   On: 08/06/2016 17:23   Mr Lumbar Spine Wo Contrast  Result Date: 08/06/2016 CLINICAL DATA:  Hervey Ard back pain when lying recumbent. Previous thoracic compression fractures. EXAM: MRI LUMBAR SPINE WITHOUT CONTRAST TECHNIQUE: Multiplanar, multisequence MR imaging of the lumbar spine was performed. No intravenous contrast was administered. COMPARISON:  05/19/2016 CT FINDINGS: Segmentation:  5 lumbar type vertebral bodies. Alignment:  Normal Vertebrae:  No fracture or primary lesion. Conus medullaris: Extends to the L1 level and appears normal. Paraspinal and other soft tissues: Normal Disc levels: No significant degenerative finding. Non-compressive disc bulges L4-5 and L5-S1. No significant facet arthropathy. No evidence of sacral pathology. IMPRESSION:  Negative lumbar evaluation. No fracture in the lumbar spine. No significant degenerative changes. No evidence of sacral pathology. Electronically Signed   By: Nelson Chimes M.D.   On: 08/06/2016 17:25      FOLLOW UP PLANS AND APPOINTMENTS  Allergies as of 09/04/2016   No Known Allergies     Medication List    TAKE these medications   acetaminophen 325 MG tablet Commonly known as:  TYLENOL Take 650 mg by mouth daily as needed for moderate pain or headache.   aspirin 81 MG chewable tablet Chew 1 tablet (81 mg total) by mouth daily.   atorvastatin 20 MG tablet Commonly known as:  LIPITOR Take 20 mg by mouth daily.   azithromycin 500 MG tablet Commonly known as:  ZITHROMAX Take 1 tablet (500 mg total) by mouth daily.   furosemide 20 MG tablet Commonly known as:  LASIX Take 20 mg by mouth daily.   guaiFENesin 600 MG 12 hr tablet Commonly known as:  MUCINEX Take 1 tablet (600 mg total) by mouth 2 (two) times daily.   HYDROcodone-acetaminophen 5-325 MG tablet Commonly known as:  NORCO/VICODIN Take 1-2 tablets by mouth every 4 (four) hours as needed (pain).   metoprolol succinate 50 MG 24 hr tablet Commonly known as:  TOPROL-XL Take 50 mg by mouth daily. Take with or immediately following a meal.   multivitamin with minerals Tabs tablet Take 1 tablet by mouth daily.   polyethylene glycol packet Commonly known as:  MIRALAX / GLYCOLAX Take 17 g by mouth daily.   potassium chloride 10 MEQ CR capsule Commonly known as:  MICRO-K Take 10 mEq by mouth daily.   rivaroxaban 20 MG Tabs tablet Commonly known as:  XARELTO Take 1 tablet (20 mg total) by mouth daily with supper.   traMADol 50 MG tablet Commonly known as:  ULTRAM Take 50 mg by mouth 2 (two) times daily as needed for moderate pain.   valsartan 320 MG tablet Commonly known as:  DIOVAN Take 320 mg by mouth daily.   Vitamin D (Ergocalciferol) 50000 units Caps capsule Commonly known as:  DRISDOL Take 50,000  Units by mouth every Wednesday.      Follow-up Information    Vyas, Sylvan Cheese, MD.   Specialty:  Cardiology Contact information: [facility] [place] [address] [place], [place] 09/04/2016, 7:45 AM Office: [phone] If no answer: [phone]

## 2016-09-04 NOTE — Care Management Note (Signed)
Case Management Note  Patient Details  Name: Morgan Bowers MRN: [id number] Date of Birth: [birth year]  Subjective/Objective:     s/p  Patent Forament Ovale(PFO) Closure  for dc today, no needs.                           Action/Plan:   Expected Discharge Date:  09/04/16               Expected Discharge Plan:  Home/Self Care  In-House Referral:     Discharge planning Services  CM Consult  Post Acute Care Choice:    Choice offered to:     DME Arranged:    DME Agency:     HH Arranged:    HH Agency:     Status of Service:  Completed, signed off  If discussed at [employer] of Stay Meetings, dates discussed:    Additional Comments:  [name], RN 09/04/2016, 11:15 AM

## 2016-09-04 NOTE — Progress Notes (Signed)
  Echocardiogram 2D Echocardiogram has been performed.  Morgan Bowers [name] 09/04/2016, 10:33 AM

## 2017-03-19 ENCOUNTER — Ambulatory Visit (INDEPENDENT_AMBULATORY_CARE_PROVIDER_SITE_OTHER): Payer: Self-pay | Admitting: Orthopaedic Surgery

## 2017-03-26 ENCOUNTER — Ambulatory Visit (INDEPENDENT_AMBULATORY_CARE_PROVIDER_SITE_OTHER): Payer: Self-pay | Admitting: Orthopaedic Surgery

## 2017-04-24 ENCOUNTER — Other Ambulatory Visit: Payer: Self-pay | Admitting: Internal Medicine

## 2017-04-24 DIAGNOSIS — Z1231 Encounter for screening mammogram for malignant neoplasm of breast: Secondary | ICD-10-CM

## 2017-05-19 ENCOUNTER — Ambulatory Visit (INDEPENDENT_AMBULATORY_CARE_PROVIDER_SITE_OTHER): Payer: Self-pay | Admitting: Orthopaedic Surgery

## 2017-05-28 ENCOUNTER — Ambulatory Visit
Admission: RE | Admit: 2017-05-28 | Discharge: 2017-05-28 | Disposition: A | Discharge status: Home / Self Care | Payer: Medicaid Other | Source: Ambulatory Visit | Attending: Internal Medicine | Admitting: Internal Medicine

## 2017-05-28 DIAGNOSIS — Z1231 Encounter for screening mammogram for malignant neoplasm of breast: Secondary | ICD-10-CM

## 2017-06-16 ENCOUNTER — Ambulatory Visit (INDEPENDENT_AMBULATORY_CARE_PROVIDER_SITE_OTHER): Payer: Self-pay | Admitting: Orthopaedic Surgery

## 2017-07-24 ENCOUNTER — Other Ambulatory Visit: Payer: Self-pay | Admitting: Gastroenterology

## 2017-08-12 ENCOUNTER — Other Ambulatory Visit: Payer: Self-pay

## 2017-08-12 ENCOUNTER — Encounter (HOSPITAL_COMMUNITY): Payer: Self-pay | Admitting: *Deleted

## 2017-08-12 NOTE — Progress Notes (Signed)
Spoke with pt's daugher, Sherlynn Stalls for pre-op call. Pt has hx of A-fib, on Xarelto and was told to stop it 2 days prior to procedure. Last dose was 08/10/17. Pt is not diabetic.  EKG - 07/08/16 - in Epic Echo - 08/15/16 - in [place]

## 2017-08-13 ENCOUNTER — Other Ambulatory Visit: Payer: Self-pay

## 2017-08-13 ENCOUNTER — Ambulatory Visit (HOSPITAL_COMMUNITY): Payer: Medicaid Other | Admitting: Certified Registered"

## 2017-08-13 ENCOUNTER — Encounter (HOSPITAL_COMMUNITY)
Admission: RE | Disposition: A | Discharge status: Home / Self Care | Payer: Self-pay | Source: Ambulatory Visit | Attending: Gastroenterology

## 2017-08-13 ENCOUNTER — Ambulatory Visit (HOSPITAL_COMMUNITY)
Admission: RE | Admit: 2017-08-13 | Discharge: 2017-08-13 | Disposition: A | Discharge status: Home / Self Care | Payer: Medicaid Other | Source: Ambulatory Visit | Attending: Gastroenterology | Admitting: Gastroenterology

## 2017-08-13 ENCOUNTER — Encounter (HOSPITAL_COMMUNITY): Payer: Self-pay

## 2017-08-13 DIAGNOSIS — Z6828 Body mass index (BMI) 28.0-28.9, adult: Secondary | ICD-10-CM | POA: Diagnosis not present

## 2017-08-13 DIAGNOSIS — R634 Abnormal weight loss: Secondary | ICD-10-CM | POA: Insufficient documentation

## 2017-08-13 DIAGNOSIS — D125 Benign neoplasm of sigmoid colon: Secondary | ICD-10-CM | POA: Diagnosis not present

## 2017-08-13 DIAGNOSIS — Z7901 Long term (current) use of anticoagulants: Secondary | ICD-10-CM | POA: Diagnosis not present

## 2017-08-13 DIAGNOSIS — D509 Iron deficiency anemia, unspecified: Secondary | ICD-10-CM | POA: Diagnosis not present

## 2017-08-13 DIAGNOSIS — R630 Anorexia: Secondary | ICD-10-CM | POA: Insufficient documentation

## 2017-08-13 DIAGNOSIS — Z79899 Other long term (current) drug therapy: Secondary | ICD-10-CM | POA: Diagnosis not present

## 2017-08-13 DIAGNOSIS — I4891 Unspecified atrial fibrillation: Secondary | ICD-10-CM | POA: Diagnosis not present

## 2017-08-13 DIAGNOSIS — K640 First degree hemorrhoids: Secondary | ICD-10-CM | POA: Diagnosis not present

## 2017-08-13 DIAGNOSIS — E7800 Pure hypercholesterolemia, unspecified: Secondary | ICD-10-CM | POA: Diagnosis not present

## 2017-08-13 DIAGNOSIS — K319 Disease of stomach and duodenum, unspecified: Secondary | ICD-10-CM | POA: Insufficient documentation

## 2017-08-13 DIAGNOSIS — I10 Essential (primary) hypertension: Secondary | ICD-10-CM | POA: Insufficient documentation

## 2017-08-13 DIAGNOSIS — R195 Other fecal abnormalities: Secondary | ICD-10-CM | POA: Diagnosis present

## 2017-08-13 DIAGNOSIS — Z791 Long term (current) use of non-steroidal anti-inflammatories (NSAID): Secondary | ICD-10-CM | POA: Insufficient documentation

## 2017-08-13 DIAGNOSIS — Z7982 Long term (current) use of aspirin: Secondary | ICD-10-CM | POA: Diagnosis not present

## 2017-08-13 HISTORY — DX: Unspecified atrial fibrillation: I48.91

## 2017-08-13 HISTORY — DX: Anemia, unspecified: D64.9

## 2017-08-13 HISTORY — PX: ESOPHAGOGASTRODUODENOSCOPY (EGD) WITH PROPOFOL: SHX5813

## 2017-08-13 HISTORY — PX: COLONOSCOPY WITH PROPOFOL: SHX5780

## 2017-08-13 SURGERY — ESOPHAGOGASTRODUODENOSCOPY (EGD) WITH PROPOFOL
Anesthesia: Monitor Anesthesia Care

## 2017-08-13 MED ORDER — PROPOFOL 500 MG/50ML IV EMUL
INTRAVENOUS | Status: DC | PRN
  Administered 2017-08-13: 50 ug/kg/min via INTRAVENOUS

## 2017-08-13 MED ORDER — PHENYLEPHRINE 40 MCG/ML (10ML) SYRINGE FOR IV PUSH (FOR BLOOD PRESSURE SUPPORT)
PREFILLED_SYRINGE | INTRAVENOUS | Status: DC | PRN
  Administered 2017-08-13 (×3): 80 ug via INTRAVENOUS

## 2017-08-13 MED ORDER — EPHEDRINE SULFATE 50 MG/ML IJ SOLN
INTRAMUSCULAR | Status: DC | PRN
  Administered 2017-08-13 (×3): 10 mg via INTRAVENOUS

## 2017-08-13 MED ORDER — LIDOCAINE 2% (20 MG/ML) 5 ML SYRINGE
INTRAMUSCULAR | Status: DC | PRN
  Administered 2017-08-13: 40 mg via INTRAVENOUS

## 2017-08-13 MED ORDER — SODIUM CHLORIDE 0.9 % IV SOLN: INTRAVENOUS | Status: DC

## 2017-08-13 MED ORDER — LACTATED RINGERS IV SOLN
INTRAVENOUS | Status: DC
  Administered 2017-08-13: 09:00:00 via INTRAVENOUS

## 2017-08-13 MED ORDER — PROPOFOL 10 MG/ML IV BOLUS
INTRAVENOUS | Status: DC | PRN
  Administered 2017-08-13: 20 mg via INTRAVENOUS
  Administered 2017-08-13 (×8): 30 mg via INTRAVENOUS

## 2017-08-13 SURGICAL SUPPLY — 24 items

## 2017-08-13 NOTE — Anesthesia Preprocedure Evaluation (Signed)
Anesthesia Evaluation  Patient identified by MRN, date of birth, ID band Patient awake    Reviewed: Allergy & Precautions, NPO status , Patient's Chart, lab work & pertinent test results  Airway Mallampati: II  TM Distance: >3 FB     Dental   Pulmonary neg pulmonary ROS, shortness of breath,    breath sounds clear to auscultation       Cardiovascular hypertension, negative cardio ROS  + dysrhythmias Atrial Fibrillation  Rhythm:Irregular Rate:Normal     Neuro/Psych negative neurological ROS  negative psych ROS   GI/Hepatic negative GI ROS, Neg liver ROS, GERD  ,  Endo/Other  negative endocrine ROS  Renal/GU negative Renal ROS  negative genitourinary   Musculoskeletal negative musculoskeletal ROS (+)   Abdominal   Peds negative pediatric ROS (+)  Hematology negative hematology ROS (+)   Anesthesia Other Findings   Reproductive/Obstetrics negative OB ROS                             Anesthesia Physical  Anesthesia Plan  ASA: II  Anesthesia Plan: MAC   Post-op Pain Management:    Induction: Intravenous  PONV Risk Score and Plan: 2 and Treatment may vary due to age or medical condition  Airway Management Planned: Nasal Cannula  Additional Equipment:   Intra-op Plan:   Post-operative Plan:   Informed Consent: I have reviewed the patients History and Physical, chart, labs and discussed the procedure including the risks, benefits and alternatives for the proposed anesthesia with the patient or authorized representative who has indicated his/her understanding and acceptance.   Dental advisory given  Plan Discussed with: CRNA, Anesthesiologist and Surgeon  Anesthesia Plan Comments:         Anesthesia Quick Evaluation

## 2017-08-13 NOTE — H&P (Signed)
Subjective:   Patient is a 72 y.o. female presents with positive stool.  She has had bloating and nausea without vomiting.  She has had a multitude of other symptoms as well.  She has been anorexic and lost weight in our office.  Her hemoglobin was down to 9.8 iron saturation was low.. Procedure including risks and benefits discussed in office.  Patient Active Problem List   Diagnosis Date Noted  . 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 Atrial fibrillation with controlled ventricular rate ([place]) 06/17/2016  . Pulmonary nodule 06/17/2016  . Anemia 06/17/2016  . Thoracic compression fracture ([place]) 06/16/2016  . Dyspnea 02/25/2016  . PFO (patent foramen ovale) 02/25/2016   Past Medical History:  Diagnosis Date  . A-fib ([place])   . Anemia   . Arthritis    "knees" (09/03/2016)  . GERD (gastroesophageal reflux disease)   . High cholesterol   . Hypertension     Past Surgical History:  Procedure Laterality Date  . CARDIOVERSION N/A 08/06/2016   Procedure: CARDIOVERSION;  Surgeon: [name], MD;  Location: [place];  Service: Cardiovascular;  Laterality: N/A;  . IR GENERIC HISTORICAL  06/19/2016   IR VERTEBROPLASTY CERV/THOR BX INC UNI/BIL INC/INJECT/IMAGING 06/19/2016 Luanne Bras, MD [facility]  . IR GENERIC HISTORICAL  06/19/2016   IR VERTEBROPLASTY EA ADDL (T&LS) BX INC UNI/BIL INC INJECT/IMAGING 06/19/2016 Luanne Bras, MD [facility]  . PATENT FORAMEN OVALE CLOSURE  09/03/2016  . PATENT FORAMEN OVALE(PFO) CLOSURE N/A 09/03/2016   Procedure: Patent [place](PFO) Closure;  Surgeon: [name], MD;  Location: [facility];  Service: Cardiovascular;  Laterality: N/A;  . TEE WITHOUT CARDIOVERSION N/A 02/29/2016   Procedure: TRANSESOPHAGEAL ECHOCARDIOGRAM (TEE);  Surgeon: [name], MD;  Location: [facility] ENDOSCOPY;  Service: Cardiovascular;  Laterality: N/A;    Medications Prior to Admission  Medication Sig Dispense Refill Last Dose  .  acetaminophen (TYLENOL) 325 MG tablet Take 650 mg by mouth daily as needed for moderate pain or headache.   Past Month at Unknown time  . aspirin 81 MG chewable tablet Chew 1 tablet (81 mg total) by mouth daily.     Marland Kitchen atorvastatin (LIPITOR) 20 MG tablet Take 20 mg by mouth daily.   09/02/2016 at Unknown time  . cycloSPORINE (RESTASIS) 0.05 % ophthalmic emulsion Place 1 drop into both eyes 2 (two) times daily.     . diclofenac sodium (VOLTAREN) 1 % GEL Apply 1 application topically daily as needed (for pain).     . Ferrous Sulfate (SLOW RELEASE IRON PO) Take 1 tablet by mouth daily.     . metoprolol succinate (TOPROL-XL) 50 MG 24 hr tablet Take 50 mg by mouth daily. Take with or immediately following a meal.   09/02/2016 at Unknown time  . PROAIR HFA 108 (90 Base) MCG/ACT inhaler Inhale 2 puffs into the lungs 4 (four) times daily as needed for shortness of breath or wheezing.  2   . rivaroxaban (XARELTO) 20 MG TABS tablet Take 1 tablet (20 mg total) by mouth daily with supper. 30 tablet 2 08/10/2017  . triamterene-hydrochlorothiazide (MAXZIDE-25) 37.5-25 MG tablet Take 1 tablet by mouth daily.     . valsartan (DIOVAN) 320 MG tablet Take 320 mg by mouth daily.   09/02/2016 at Unknown time  . Vitamin D, Ergocalciferol, (DRISDOL) 50000 units CAPS capsule Take 50,000 Units by mouth every Wednesday.   Past Month at Unknown time  . guaiFENesin (MUCINEX) 600 MG 12 hr  tablet Take 1 tablet (600 mg total) by mouth 2 (two) times daily. (Patient not taking: Reported on 08/30/2016) 14 tablet 0 Completed Course at Unknown time  . HYDROcodone-acetaminophen (NORCO/VICODIN) 5-325 MG tablet Take 1-2 tablets by mouth every 4 (four) hours as needed (pain). (Patient not taking: Reported on 08/30/2016) 10 tablet 0 Completed Course at Unknown time  . polyethylene glycol (MIRALAX / GLYCOLAX) packet Take 17 g by mouth daily. (Patient not taking: Reported on 08/30/2016) 14 each 0 Completed Course at Unknown time   No Known Allergies   Social History   Tobacco Use  . Smoking status: Never Smoker  . Smokeless tobacco: Never Used  Substance Use Topics  . Alcohol use: No    History reviewed. No pertinent family history.   Objective:   Patient Vitals for the past 8 hrs:  BP Temp Temp src Pulse Resp SpO2 Height Weight  08/13/17 0916 137/75 98.4 F (36.9 C) Oral 67 16 94 % 5\' 5"  (1.651 m) 77.1 kg (170 lb)   No intake/output data recorded. No intake/output data recorded.   See MD Preop evaluation      Assessment:   1.  Iron deficiency anemia with guaiac positive stool 2.  Weight loss and anorexia  Plan:   We will proceed with EGD and colonoscopy.  Have discussed this in detail with the patient and daughter.

## 2017-08-13 NOTE — Transfer of Care (Signed)
Immediate Anesthesia Transfer of Care Note  Patient: Zayneb Baucum  Procedure(s) Performed: ESOPHAGOGASTRODUODENOSCOPY (EGD) WITH PROPOFOL (N/A ) COLONOSCOPY WITH PROPOFOL (N/A )  Patient Location: Endoscopy Unit  Anesthesia Type:MAC  Level of Consciousness: drowsy and patient cooperative  Airway & Oxygen Therapy: Patient Spontanous Breathing and Patient connected to nasal cannula oxygen  Post-op Assessment: Report given to RN  Post vital signs: Reviewed and stable  Last Vitals:  Vitals Value Taken Time  BP 89/39 08/13/2017 10:42 AM  Temp    Pulse 71 08/13/2017 10:43 AM  Resp 17 08/13/2017 10:43 AM  SpO2 100 % 08/13/2017 10:43 AM  Vitals shown include unvalidated device data.  Last Pain:  Vitals:   08/13/17 1042  TempSrc:   PainSc: 0-No pain         Complications: No apparent anesthesia complications

## 2017-08-13 NOTE — Discharge Instructions (Signed)
Colonoscopy, Adult, Care After °This sheet gives you information about how to care for yourself after your procedure. Your doctor may also give you more specific instructions. If you have problems or questions, call your doctor. °Follow these instructions at home: °General instructions ° °· For the first 24 hours after the procedure: °? Do not drive or use machinery. °? Do not sign important documents. °? Do not drink alcohol. °? Do your daily activities more slowly than normal. °? Eat foods that are soft and easy to digest. °? Rest often. °· Take over-the-counter or prescription medicines only as told by your doctor. °· It is up to you to get the results of your procedure. Ask your doctor, or the department performing the procedure, when your results will be ready. °To help cramping and bloating: °· Try walking around. °· Put heat on your belly (abdomen) as told by your doctor. Use a heat source that your doctor recommends, such as a moist heat pack or a heating pad. °? Put a towel between your skin and the heat source. °? Leave the heat on for 20-30 minutes. °? Remove the heat if your skin turns bright red. This is especially important if you cannot feel pain, heat, or cold. You can get burned. °Eating and drinking °· Drink enough fluid to keep your pee (urine) clear or pale yellow. °· Return to your normal diet as told by your doctor. Avoid heavy or fried foods that are hard to digest. °· Avoid drinking alcohol for as long as told by your doctor. °Contact a doctor if: °· You have blood in your poop (stool) 2-3 days after the procedure. °Get help right away if: °· You have more than a small amount of blood in your poop. °· You see large clumps of tissue (blood clots) in your poop. °· Your belly is swollen. °· You feel sick to your stomach (nauseous). °· You throw up (vomit). °· You have a fever. °· You have belly pain that gets worse, and medicine does not help your pain. °This information is not intended to  replace advice given to you by your health care provider. Make sure you discuss any questions you have with your health care provider. °Document Released: 06/01/2010 Document Revised: 01/22/2016 Document Reviewed: 01/22/2016 °Elsevier Interactive Patient Education © 2017 Else[employer] °Esophagogastroduodenoscopy, Care After °Refer to this sheet in the next few weeks. These instructions provide you with information about caring for yourself after your procedure. Your health care provider may also give you more specific instructions. Your treatment has been planned according to current medical practices, but problems sometimes occur. Call your health care provider if you have any problems or questions after your procedure. °What can I expect after the procedure? °After the procedure, it is common to have: °· A sore throat. °· Nausea. °· Bloating. °· Dizziness. °· Fatigue. ° °Follow these instructions at home: °· Do not eat or drink anything until the numbing medicine (local anesthetic) has worn off and your gag reflex has returned. You will know that the local anesthetic has worn off when you can swallow comfortably. °· Do not drive for 24 hours if you received a medicine to help you relax (sedative). °· If your health care provider took a tissue sample for testing during the procedure, make sure to get your test results. This is your responsibility. Ask your health care provider or the department performing the test when your results will be ready. °· Keep all follow-up visits as told   by your health care provider. This is important. Contact a health care provider if:  You cannot stop coughing.  You are not urinating.  You are urinating less than usual. Get help right away if:  You have trouble swallowing.  You cannot eat or drink.  You have throat or chest pain that gets worse.  You are dizzy or light-headed.  You faint.  You have nausea or vomiting.  You have chills.  You have a fever.  You  have severe abdominal pain.  You have black, tarry, or bloody stools. This information is not intended to replace advice given to you by your health care provider. Make sure you discuss any questions you have with your health care provider. Document Released: 04/15/2012 Document Revised: 10/05/2015 Document Reviewed: 03/23/2015 Elsevier Interactive Patient Education  2018 [place] in 3 weeks.

## 2017-08-13 NOTE — Anesthesia Postprocedure Evaluation (Signed)
Anesthesia Post Note  Patient: Morgan Bowers  Procedure(s) Performed: ESOPHAGOGASTRODUODENOSCOPY (EGD) WITH PROPOFOL (N/A ) COLONOSCOPY WITH PROPOFOL (N/A )     Patient location during evaluation: PACU Anesthesia Type: MAC Level of consciousness: awake and alert Pain management: pain level controlled Vital Signs Assessment: post-procedure vital signs reviewed and stable Respiratory status: spontaneous breathing, nonlabored ventilation and respiratory function stable Cardiovascular status: stable and blood pressure returned to baseline Postop Assessment: no apparent nausea or vomiting Anesthetic complications: no    Last Vitals:  Vitals:   08/13/17 1042 08/13/17 1054  BP: (!) 89/39 (!) 157/64  Pulse: 70 70  Resp: 19 20  Temp:    SpO2: 100% 100%    Last Pain:  Vitals:   08/13/17 1054  TempSrc:   PainSc: 0-No pain                 Lynda Rainwater

## 2017-08-13 NOTE — Op Note (Signed)
[facility] Patient Name: Morgan Bowers Procedure Date : 08/13/2017 MRN: [id number] Attending MD: Nancy Fetter Dr., MD Date of Birth: [birth year] CSN: [id number] Age: 72 Admit Type: Outpatient Procedure:                Colonoscopy with polypectomy and placement of                            endoscopic clip Indications:              Heme positive stool, Iron deficiency anemia Providers:                Joyice Faster. Abdifatah Colquhoun Dr., MD, Presley Raddle, RN,                            Alan Mulder, Technician, Sampson Si, CRNA Referring MD:             Dr Jeanie Cooks Medicines:                Monitored Anesthesia Care Complications:            Moderate bleeding Estimated Blood Loss:     Estimated blood loss was minimal. Estimated blood                            loss was minimal. Procedure:                Pre-Anesthesia Assessment:                           - Prior to the procedure, a History and Physical                            was performed, and patient medications and                            allergies were reviewed. The patient's tolerance of                            previous anesthesia was also reviewed. The risks                            and benefits of the procedure and the sedation                            options and risks were discussed with the patient.                            All questions were answered, and informed consent                            was obtained. Prior Anticoagulants: The patient has                            taken Xarelto (rivaroxaban), last dose was 3 days  prior to procedure. ASA Grade Assessment: III - A                            patient with severe systemic disease. After                            reviewing the risks and benefits, the patient was                            deemed in satisfactory condition to undergo the                            procedure.                           After obtaining  informed consent, the colonoscope                            was passed under direct vision. Throughout the                            procedure, the patient's blood pressure, pulse, and                            oxygen saturations were monitored continuously. The                            [id number] ([id number]) scope was introduced through                            the anus and advanced to the the cecum, identified                            by appendiceal orifice and ileocecal valve. The                            colonoscopy was somewhat difficult due to a                            tortuous colon. Successful completion of the                            procedure was aided by applying abdominal pressure.                            The patient tolerated the procedure well. The                            quality of the bowel preparation was good. Scope In: 10:02:01 AM Scope Out: 10:32:36 AM Scope Withdrawal Time: 0 hours 20 minutes 12 seconds  Total Procedure Duration: 0 hours 30 minutes 35 seconds  Findings:      The perianal and digital rectal examinations were normal.      A 10 mm polyp was found in the  recto-sigmoid colon. The polyp was       pedunculated. The polyp was removed with a hot snare. Resection and       retrieval were complete. To prevent bleeding after the polypectomy, one       hemostatic clip was successfully placed. There was no bleeding at the       end of the procedure. unfortunately, the staff cut through the polyp as       the snare was being tightened with no cautery. We then place thesnare       around the stalk and applied cautery. There was still some losing so we       went ahead and place in endoscopic clip. After this was done there was       no bleeding.      Non-bleeding internal hemorrhoids were found during retroflexion. The       hemorrhoids were medium-sized and Grade I (internal hemorrhoids that do       not prolapse). Impression:               -  One 10 mm polyp at the recto-sigmoid colon,                            removed with a hot snare. Resected and retrieved.                            Clip was placed.                           - Non-bleeding internal hemorrhoids. Recommendation:           - Patient has a contact number available for                            emergencies. The signs and symptoms of potential                            delayed complications were discussed with the                            patient. Return to normal activities tomorrow.                            Written discharge instructions were provided to the                            patient.                           - Resume previous diet.                           - Resume Xarelto (rivaroxaban) at prior dose in 7                            days.                           - Miralax 1 capful (17 grams)  in 8 ounces of water                            PO BID for 2 weeks.                           - Resume previous diet.                           - Return to endoscopist in 3 weeks. Procedure Code(s):        --- Professional ---                           [phone], Colonoscopy, flexible; with removal of                            tumor(s), polyp(s), or other lesion(s) by snare                            technique Diagnosis Code(s):        --- Professional ---                           D12.7, Benign neoplasm of rectosigmoid junction                           K64.0, First degree hemorrhoids                           R19.5, Other fecal abnormalities                           D50.9, Iron deficiency anemia, unspecified CPT copyright 2017 American Medical Association. All rights reserved. The codes documented in this report are preliminary and upon coder review may  be revised to meet current compliance requirements. Nancy Fetter Dr., MD 08/13/2017 10:49:53 AM This report has been signed electronically. Number of Addenda: 0

## 2017-08-13 NOTE — Op Note (Signed)
[facility] Patient Name: Morgan Bowers Procedure Date : 08/13/2017 MRN: [id number] Attending MD: Nancy Fetter Dr., MD Date of Birth: [birth year] CSN: [id number] Age: 72 Admit Type: Outpatient Procedure:                Upper GI endoscopy Indications:              Iron deficiency anemia, Heme positive stool Providers:                Jeneen Rinks L. Maikel Neisler Dr., MD, Presley Raddle, RN,                            Alan Mulder, Technician, Sampson Si, CRNA Referring MD:             Dr. Jeanie Cooks Medicines:                Monitored Anesthesia Care Complications:            No immediate complications. Estimated Blood Loss:     Estimated blood loss: none. Procedure:                Pre-Anesthesia Assessment:                           - Prior to the procedure, a History and Physical                            was performed, and patient medications and                            allergies were reviewed. The patient's tolerance of                            previous anesthesia was also reviewed. The risks                            and benefits of the procedure and the sedation                            options and risks were discussed with the patient.                            All questions were answered, and informed consent                            was obtained. Prior Anticoagulants: The patient has                            taken Xarelto (rivaroxaban), last dose was 2 days                            prior to procedure. ASA Grade Assessment: III - A                            patient with severe systemic disease. After  reviewing the risks and benefits, the patient was                            deemed in satisfactory condition to undergo the                            procedure.                           After obtaining informed consent, the endoscope was                            passed under direct vision. Throughout the      procedure, the patient's blood pressure, pulse, and                            oxygen saturations were monitored continuously. The                            [id number] ([id number]) scope was introduced through the                            mouth, and advanced to the third part of duodenum.                            The upper GI endoscopy was accomplished without                            difficulty. The patient tolerated the procedure                            well. Scope In: Scope Out: Findings:      The esophagus was normal.      Localized mild inflammation characterized by erosions and erythema was       found in the gastric antrum. Biopsies were taken with a cold forceps for       histology.      The examined duodenum was normal. Impression:               - Normal esophagus.                           - Chronic gastritis. Biopsied.                           - Normal examined duodenum. Moderate Sedation:      See anesthesia note, no moderate sedation. Recommendation:           - Patient has a contact number available for                            emergencies. The signs and symptoms of potential                            delayed complications were discussed with the  patient. Return to normal activities tomorrow.                            Written discharge instructions were provided to the                            patient.                           - Resume previous diet.                           - Resume Xarelto (rivaroxaban) at prior dose in 7                            days.                           - Return to endoscopist in 3 weeks. Procedure Code(s):        --- Professional ---                           [phone], Esophagogastroduodenoscopy, flexible,                            transoral; with biopsy, single or multiple Diagnosis Code(s):        --- Professional ---                           K29.50, Unspecified chronic gastritis without                             bleeding                           D50.9, Iron deficiency anemia, unspecified                           R19.5, Other fecal abnormalities CPT copyright 2017 American Medical Association. All rights reserved. The codes documented in this report are preliminary and upon coder review may  be revised to meet current compliance requirements. Nancy Fetter Dr., MD 08/13/2017 10:40:46 AM This report has been signed electronically. Number of Addenda: 0

## 2017-08-13 NOTE — Anesthesia Procedure Notes (Signed)
Procedure Name: MAC Date/Time: 08/13/2017 9:40 AM Performed by: Barrington Ellison, CRNA Pre-anesthesia Checklist: Patient identified, Emergency Drugs available, Suction available, Patient being monitored and Timeout performed Patient Re-evaluated:Patient Re-evaluated prior to induction Oxygen Delivery Method: Nasal cannula

## 2017-08-14 ENCOUNTER — Encounter (HOSPITAL_COMMUNITY): Payer: Self-pay | Admitting: Gastroenterology

## 2017-10-01 IMAGING — CT CT HEAD W/O CM
3 of 4 series · 18 of 47 positions shown, 21 images · non-contrast
Comparison: Head CT 07/07/2015

CLINICAL DATA: Headache and dizziness for 2 weeks.

EXAM:
CT HEAD WITHOUT CONTRAST
TECHNIQUE: Contiguous axial images were obtained from the base of the skull
through the vertex without intravenous contrast.

[Series 201: head w/o, idose (1) · axial · non-contrast · 0.40mm/px · z∈[+73,+193]mm · 12 of 30 slices shown, 15 images]
[im 3/30  brain]
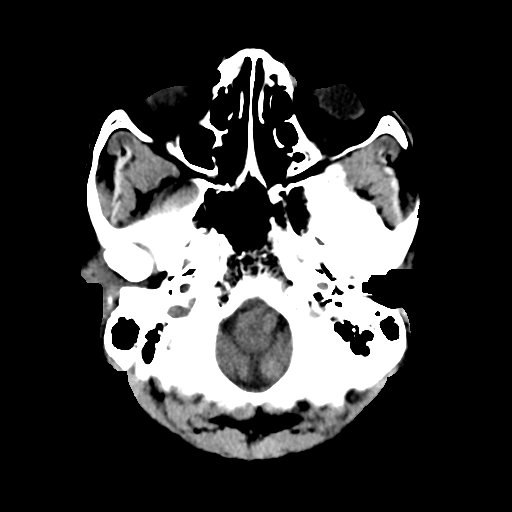
[im 3/30  bone]
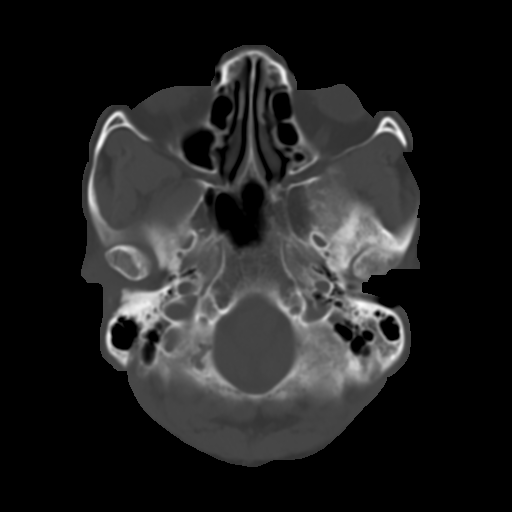
[im 5/30  brain]
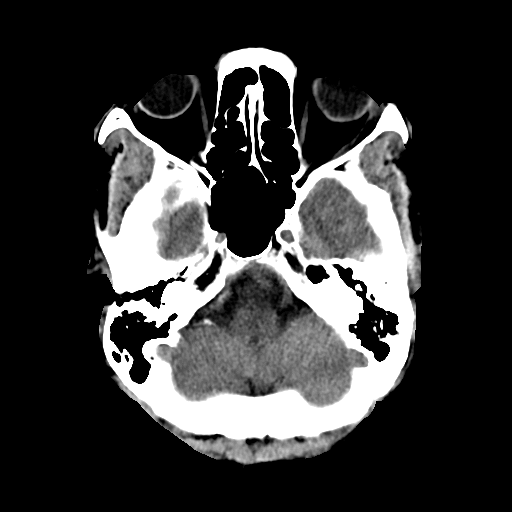
[im 7/30  brain]
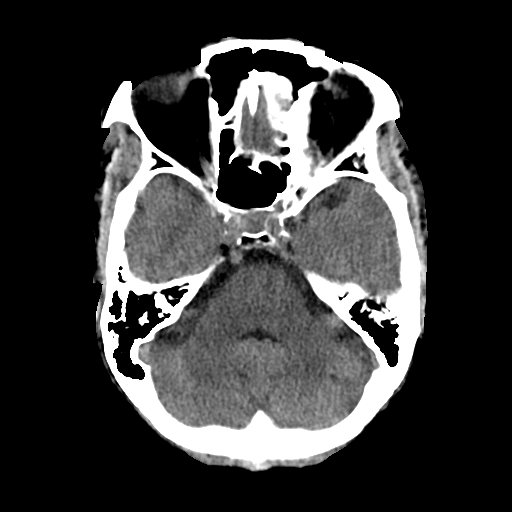
[im 9/30  brain]
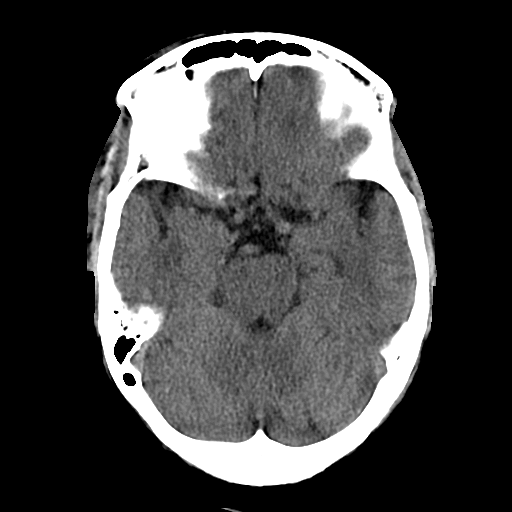
[im 11/30  brain]
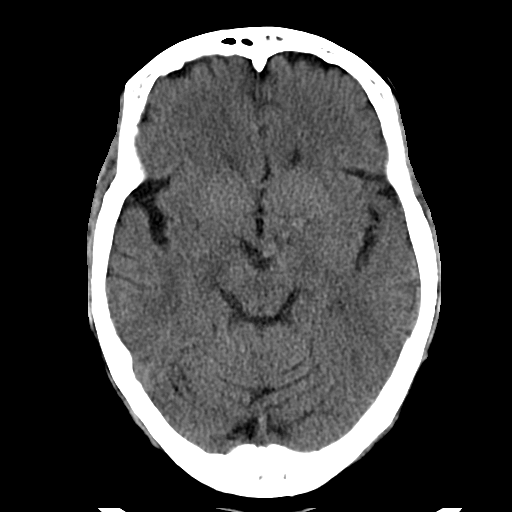
[im 11/30  bone]
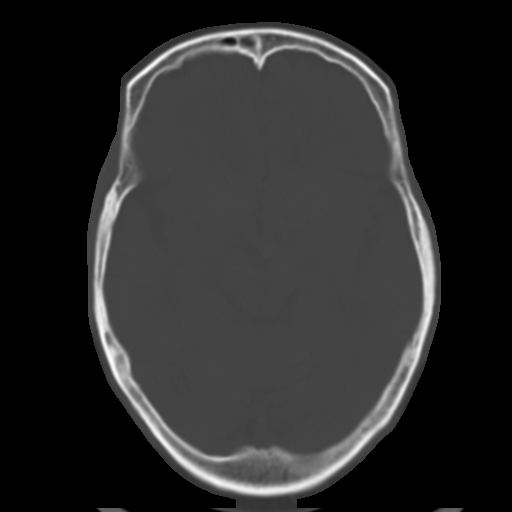
[im 13/30  brain]
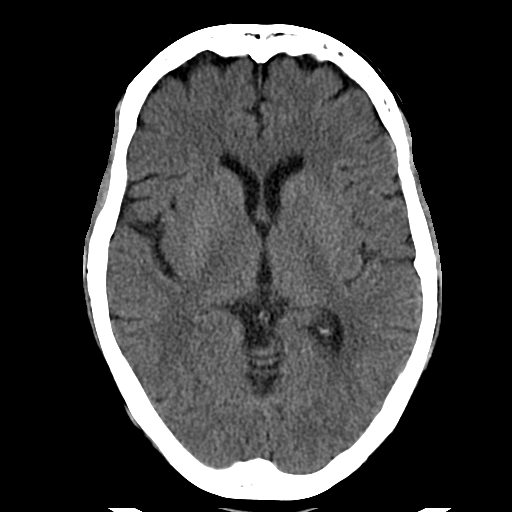
[im 17/30  brain]
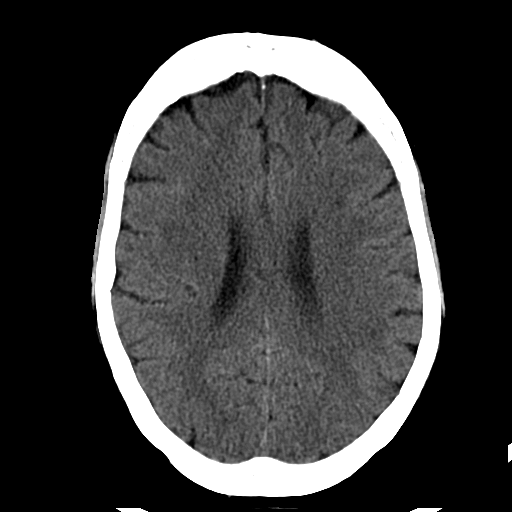
[im 19/30  brain]
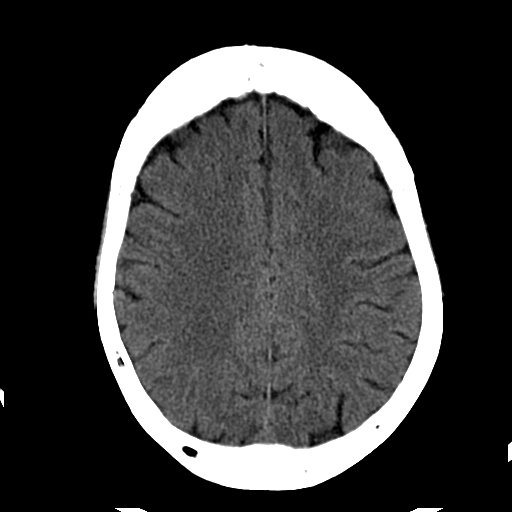
[im 21/30  brain]
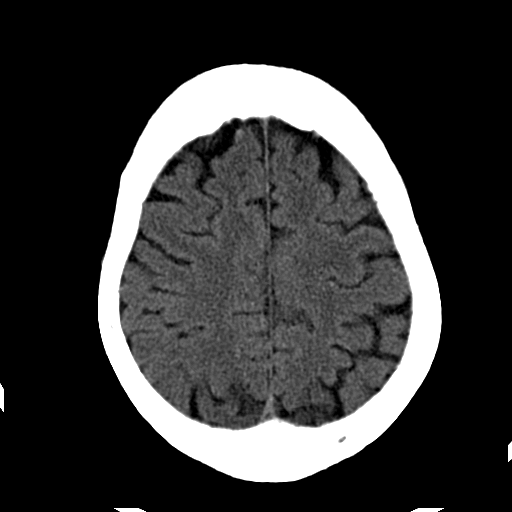
[im 21/30  bone]
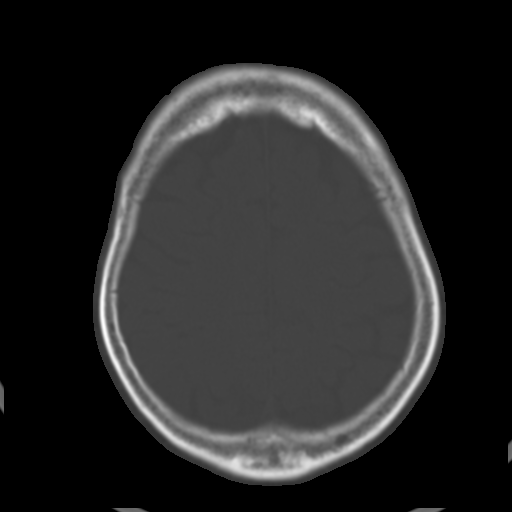
[im 23/30  brain]
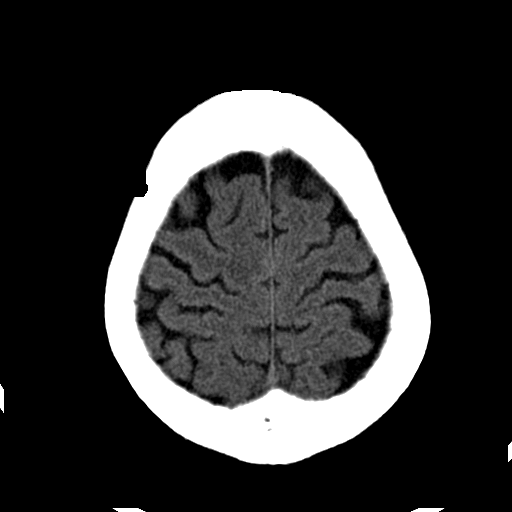
[im 25/30  brain]
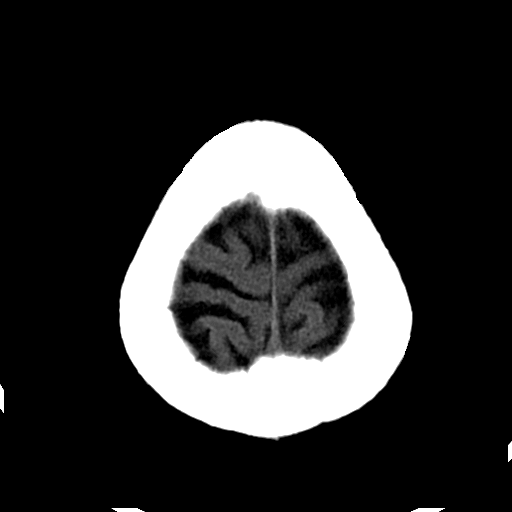
[im 27/30  brain]
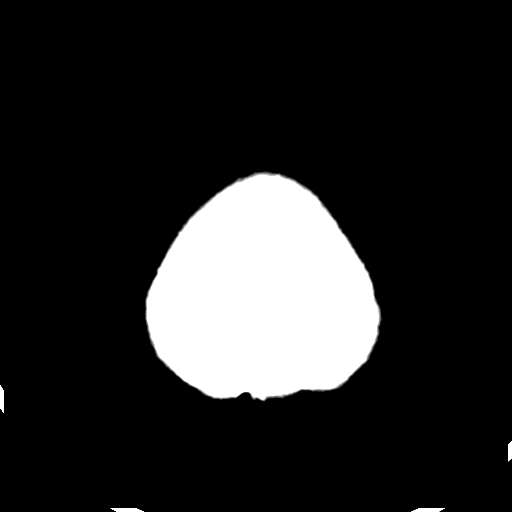

[Series 203: coronal st, idose (1) · coronal · 0.40mm/px · 3 of 67 slices shown]
[im 23/67  brain]
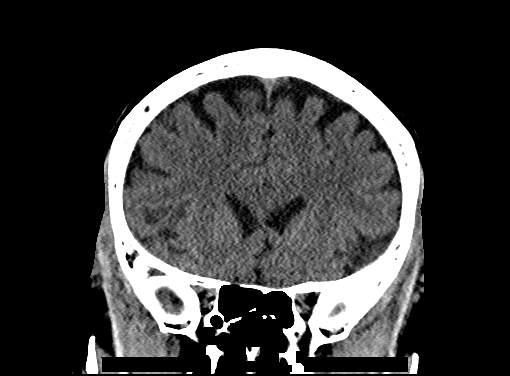
[im 30/67  brain]
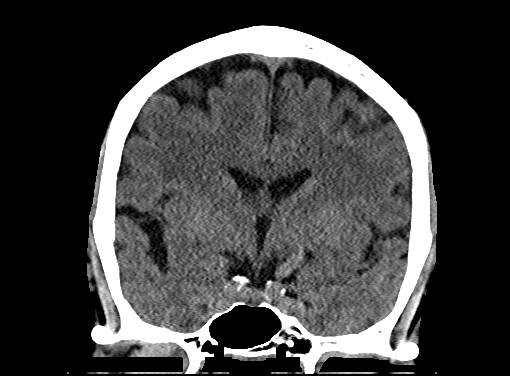
[im 37/67  brain]
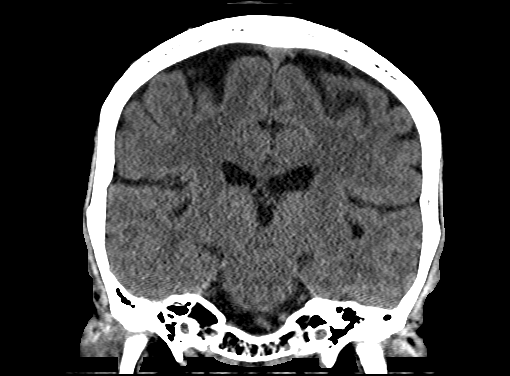

[Series 204: sagittal st, idose (1) · sagittal · 0.40mm/px · 3 of 68 slices shown]
[im 23/68  brain]
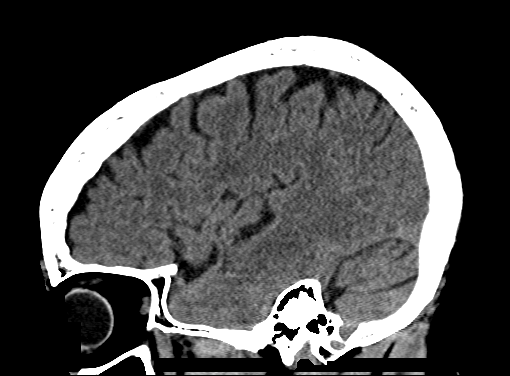
[im 34/68  brain]
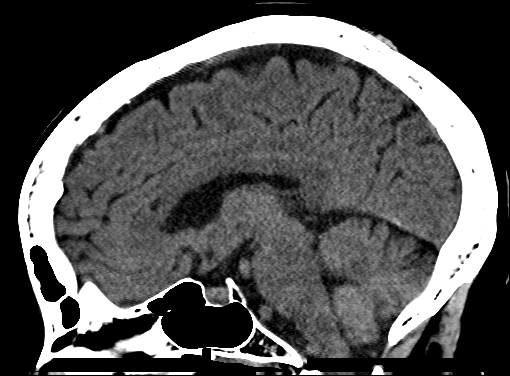
[im 45/68  brain]
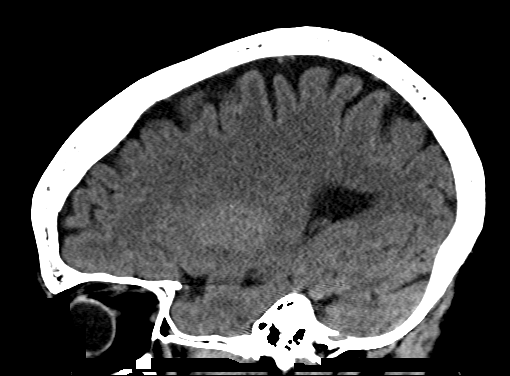

[18 of 47 positions shown; findings below may reference images not displayed]

FINDINGS: Brain: No evidence of acute infarction, hemorrhage, extra-axial
collection, ventriculomegaly, or mass effect. Mild brain parenchymal
atrophy.

Vascular: No hyperdense vessel or unexpected calcification.

Skull: Negative for fracture or focal lesion.

Sinuses/Orbits: No acute findings.

Other: None.
IMPRESSION: No acute intracranial abnormality.

## 2018-04-17 ENCOUNTER — Other Ambulatory Visit: Payer: Self-pay | Admitting: Internal Medicine

## 2018-04-17 DIAGNOSIS — Z1231 Encounter for screening mammogram for malignant neoplasm of breast: Secondary | ICD-10-CM

## 2018-05-13 HISTORY — PX: TOE SURGERY: SHX1073

## 2018-05-14 ENCOUNTER — Ambulatory Visit (HOSPITAL_COMMUNITY)
Admission: EM | Admit: 2018-05-14 | Discharge: 2018-05-14 | Disposition: A | Discharge status: Home / Self Care | Payer: Medicaid Other | Attending: Family Medicine | Admitting: Family Medicine

## 2018-05-14 ENCOUNTER — Other Ambulatory Visit: Payer: Self-pay

## 2018-05-14 ENCOUNTER — Encounter (HOSPITAL_COMMUNITY): Payer: Self-pay | Admitting: Emergency Medicine

## 2018-05-14 ENCOUNTER — Ambulatory Visit (INDEPENDENT_AMBULATORY_CARE_PROVIDER_SITE_OTHER): Payer: Medicaid Other

## 2018-05-14 DIAGNOSIS — R062 Wheezing: Secondary | ICD-10-CM | POA: Insufficient documentation

## 2018-05-14 DIAGNOSIS — J209 Acute bronchitis, unspecified: Secondary | ICD-10-CM

## 2018-05-14 DIAGNOSIS — R05 Cough: Secondary | ICD-10-CM | POA: Diagnosis not present

## 2018-05-14 MED ORDER — NEBULIZER DEVI: 1.0000 "application " | Freq: Once | 0 refills | Status: AC

## 2018-05-14 MED ORDER — PREDNISONE 50 MG PO TABS: 50.0000 mg | ORAL_TABLET | Freq: Every day | ORAL | 0 refills | Status: AC

## 2018-05-14 MED ORDER — IPRATROPIUM-ALBUTEROL 0.5-2.5 (3) MG/3ML IN SOLN: 3.0000 mL | Freq: Four times a day (QID) | RESPIRATORY_TRACT | 0 refills | Status: DC | PRN

## 2018-05-14 MED ORDER — IPRATROPIUM-ALBUTEROL 0.5-2.5 (3) MG/3ML IN SOLN
3.0000 mL | Freq: Once | RESPIRATORY_TRACT | Status: AC
  Administered 2018-05-14: 3 mL via RESPIRATORY_TRACT

## 2018-05-14 MED ORDER — IPRATROPIUM-ALBUTEROL 0.5-2.5 (3) MG/3ML IN SOLN
RESPIRATORY_TRACT | Status: AC
  Filled 2018-05-14: qty 3

## 2018-05-14 NOTE — Discharge Instructions (Addendum)
No pneumonia or fluid on chest xray Begin prednisone daily for 5 days Inhaler or nebulizers as needed  Please follow up in emergency room if having worsening shortness of breath, leg swelling, chest discomfort. Return here or PCP if symptoms not resolving.

## 2018-05-14 NOTE — ED Triage Notes (Addendum)
Pt has been suffering from a cough and SOB since Sunday.  She reports having an inhaler, but does not know what kind.  She is also unsure of the medications she is taking.

## 2018-05-15 NOTE — ED Provider Notes (Signed)
[facility]    CSN: [id number] Arrival date & time: 05/14/18  1109     History   Chief Complaint Chief Complaint  Patient presents with  . Cough    HPI Morgan Bowers is a 73 y.o. female history of A. fib on Xarelto, hypertension, GERD presenting today for evaluation of cough and shortness of breath.  Symptoms have been going on since Sunday, for the past 4 to 5 days.  She has been using an albuterol inhaler without relief.  She denies any fevers.  Minimal congestion and sore throat.  Denies history of smoking or asthma.  She has had increased shortness of breath doing daily tasks.  States that she will get winded moving around in her house and will have to sit down.  She is also noticed increased swelling to her lower legs.  Patient states that she has not had pain with this.  She typically gets leg swelling and will all take Lasix and use compression stockings.  She has noticed this worsening of recently.  Denies history of DVT/PE.  Denies recent travel or immobilization.  HPI  Past Medical History:  Diagnosis Date  . A-fib ([place])   . Anemia   . Arthritis    "knees" (09/03/2016)  . GERD (gastroesophageal reflux disease)   . High cholesterol   . Hypertension     Patient Active Problem List   Diagnosis Date Noted  . 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 Atrial fibrillation with controlled ventricular rate ([place]) 06/17/2016  . Pulmonary nodule 06/17/2016  . Anemia 06/17/2016  . Thoracic compression fracture ([place]) 06/16/2016  . Dyspnea 02/25/2016  . PFO (patent foramen ovale) 02/25/2016    Past Surgical History:  Procedure Laterality Date  . CARDIOVERSION N/A 08/06/2016   Procedure: CARDIOVERSION;  Surgeon: [name], MD;  Location: [facility];  Service: Cardiovascular;  Laterality: N/A;  . COLONOSCOPY WITH PROPOFOL N/A 08/13/2017   Procedure: COLONOSCOPY WITH PROPOFOL;  Surgeon: Laurence Spates, MD;  Location: [facility];  Service: Endoscopy;  Laterality: N/A;  . ESOPHAGOGASTRODUODENOSCOPY (EGD) WITH PROPOFOL N/A 08/13/2017   Procedure: ESOPHAGOGASTRODUODENOSCOPY (EGD) WITH PROPOFOL;  Surgeon: Laurence Spates, MD;  Location: [facility];  Service: Endoscopy;  Laterality: N/A;  . IR GENERIC HISTORICAL  06/19/2016   IR VERTEBROPLASTY CERV/THOR BX INC UNI/BIL INC/INJECT/IMAGING 06/19/2016 Luanne Bras, MD [facility]  . IR GENERIC HISTORICAL  06/19/2016   IR VERTEBROPLASTY EA ADDL (T&LS) BX INC UNI/BIL INC INJECT/IMAGING 06/19/2016 Luanne Bras, MD [facility]  . PATENT FORAMEN OVALE CLOSURE  09/03/2016  . PATENT FORAMEN OVALE(PFO) CLOSURE N/A 09/03/2016   Procedure: Patent Forament [place]ale(PFO) Closure;  Surgeon: [name], MD;  Location: [facility];  Service: Cardiovascular;  Laterality: N/A;  . TEE WITHOUT CARDIOVERSION N/A 02/29/2016   Procedure: TRANSESOPHAGEAL ECHOCARDIOGRAM (TEE);  Surgeon: [name], MD;  Location: [facility] ENDOSCOPY;  Service: Cardiovascular;  Laterality: N/A;    OB History   No obstetric history on file.      Home Medications    Prior to Admission medications   Medication Sig Start Date End Date Taking? Authorizing Provider  acetaminophen (TYLENOL) 325 MG tablet Take 650 mg by mouth daily as needed for moderate pain or headache.    [provider]  aspirin 81 MG chewable tablet Chew 1 tablet (81 mg total) by mouth daily. 09/04/16   Miquel Dunn, NP  atorvastatin (LIPITOR) 20 MG tablet Take 20 mg by mouth daily.  [provider]  cycloSPORINE (RESTASIS) 0.05 % ophthalmic emulsion Place 1 drop into both eyes 2 (two) times daily.    [provider]  diclofenac sodium (VOLTAREN) 1 % GEL Apply 1 application topically daily as needed (for pain).    [provider]  Ferrous Sulfate (SLOW RELEASE IRON PO) Take 1 tablet by mouth daily.    [provider]  ipratropium-albuterol (DUONEB) 0.5-2.5 (3) MG/3ML SOLN Take 3 mLs  by nebulization every 6 (six) hours as needed. 05/14/18   Lucky Trotta C, PA-C  metoprolol succinate (TOPROL-XL) 50 MG 24 hr tablet Take 50 mg by mouth daily. Take with or immediately following a meal.    [provider]  predniSONE (DELTASONE) 50 MG tablet Take 1 tablet (50 mg total) by mouth daily for 5 days. 05/14/18 05/19/18  Dejane Scheibe C, PA-C  PROAIR HFA 108 (90 Base) MCG/ACT inhaler Inhale 2 puffs into the lungs 4 (four) times daily as needed for shortness of breath or wheezing. 08/04/17   [provider]  triamterene-hydrochlorothiazide (MAXZIDE-25) 37.5-25 MG tablet Take 1 tablet by mouth daily.    [provider]  valsartan (DIOVAN) 320 MG tablet Take 320 mg by mouth daily.    [provider]  Vitamin D, Ergocalciferol, (DRISDOL) 50000 units CAPS capsule Take 50,000 Units by mouth every Wednesday.    [provider]    Family History History reviewed. No pertinent family history.  Social History Social History   Tobacco Use  . Smoking status: Never Smoker  . Smokeless tobacco: Never Used  Substance Use Topics  . Alcohol use: No  . Drug use: No     Allergies   Patient has no known allergies.   Review of Systems Review of Systems  Constitutional: Negative for activity change, appetite change, chills, fatigue and fever.  HENT: Positive for congestion and rhinorrhea. Negative for ear pain, sinus pressure, sore throat and trouble swallowing.   Eyes: Negative for discharge and redness.  Respiratory: Positive for cough, shortness of breath and wheezing. Negative for chest tightness.   Cardiovascular: Positive for leg swelling. Negative for chest pain.  Gastrointestinal: Negative for abdominal pain, diarrhea, nausea and vomiting.  Musculoskeletal: Negative for myalgias.  Skin: Negative for rash.  Neurological: Negative for dizziness, light-headedness and headaches.     Physical Exam Triage Vital Signs ED Triage Vitals  Enc  Vitals Group     BP 05/14/18 1233 (!) 145/75     Pulse Rate 05/14/18 1233 60     Resp 05/14/18 1233 (!) 30     Temp 05/14/18 1233 97.9 F (36.6 C)     Temp Source 05/14/18 1233 Oral     SpO2 05/14/18 1233 100 %     Weight --      Height --      Head Circumference --      Peak Flow --      Pain Score 05/14/18 1232 0     Pain Loc --      Pain Edu? --      Excl. in [place]? --    No data found.  Updated Vital Signs BP (!) 145/75 (BP Location: Right Arm)   Pulse 60   Temp 97.9 F (36.6 C) (Oral)   Resp (!) 30   SpO2 100%   Visual Acuity Right Eye Distance:   Left Eye Distance:   Bilateral Distance:    Right Eye Near:   Left Eye Near:    Bilateral Near:  Physical Exam Vitals signs and nursing note reviewed.  Constitutional:      General: She is not in acute distress.    Appearance: She is well-developed.  HENT:     Head: Normocephalic and atraumatic.     Ears:     Comments: Bilateral ears without tenderness to palpation of external auricle, tragus and mastoid, EAC's without erythema or swelling, TM's with good bony landmarks and cone of light. Non erythematous.     Mouth/Throat:     Comments: Oral mucosa pink and moist, no tonsillar enlargement or exudate. Posterior pharynx patent and nonerythematous, no uvula deviation or swelling. Normal phonation. Eyes:     Conjunctiva/sclera: Conjunctivae normal.  Neck:     Musculoskeletal: Neck supple.  Cardiovascular:     Rate and Rhythm: Normal rate and regular rhythm.     Heart sounds: No murmur.  Pulmonary:     Effort: Pulmonary effort is normal. No respiratory distress.     Breath sounds: Wheezing present.     Comments: Bilateral inspiratory and expiratory wheezing throughout lung fields Abdominal:     Palpations: Abdomen is soft.     Tenderness: There is no abdominal tenderness.  Musculoskeletal:     Comments: Bilateral lower extremities with 1-2+ pitting edema extending to shin, no calf tenderness, no overlying  erythema, negative Homans bilaterally  Skin:    General: Skin is warm and dry.  Neurological:     Mental Status: She is alert.      UC Treatments / Results  Labs (all labs ordered are listed, but only abnormal results are displayed) Labs Reviewed - No data to display  EKG None  Radiology Dg Chest 2 View  Result Date: 05/14/2018 CLINICAL DATA:  Five day history of cough, wheezing and LOWER extremity swelling. EXAM: CHEST - 2 VIEW COMPARISON:  07/08/2016 and earlier, including CTA chest 06/16/2016. FINDINGS: Cardiac silhouette mildly to moderately enlarged, unchanged. Foramen ovale closure device noted. Thoracic aorta tortuous and mildly atherosclerotic, unchanged. Hilar and mediastinal contours otherwise unremarkable. Prominent bronchovascular markings diffusely and moderate central peribronchial thickening, more so than on prior examinations. Lungs otherwise clear. No confluent airspace consolidation. No pleural effusions. Mild pulmonary venous hypertension without overt edema. Multiple thoracic spine compression fractures as noted previously with prior augmentation of what I believe are T8 and T9. IMPRESSION: 1. Moderate changes of acute bronchitis and/or asthma without focal airspace pneumonia. 2. Stable cardiomegaly without pulmonary edema. Electronically Signed   By: Evangeline Dakin M.D.   On: 05/14/2018 13:30    Procedures Procedures (including critical care time)  Medications Ordered in UC Medications  ipratropium-albuterol (DUONEB) 0.5-2.5 (3) MG/3ML nebulizer solution 3 mL (3 mLs Nebulization Given 05/14/18 1358)    Initial Impression / Assessment and Plan / UC Course  I have reviewed the triage vital signs and the nursing notes.  Pertinent labs & imaging results that were available during my care of the patient were reviewed by me and considered in my medical decision making (see chart for details).     73 year old female with wheezing in bilateral lungs, chest x-ray  obtained to check for pneumonia as well as fluid.  Negative for both.  Suggestive of bronchitis.  Will treat as such, DuoNeb in clinic.  Will discharge with prednisone, prescription for nebulizer machine and duo nebs at home.  We were out of nebulizer machines at the urgent care, provided printed prescription.  Was told that we should have refills that tomorrow, advised if they do not have success with  the printed prescription they may return tomorrow to have nebulizer provided to them.  Daughter stated that she gets this frequently.  Continue to monitor temperature, breathing, cough, Discussed strict return precautions. Patient verbalized understanding and is agreeable with plan.  Patient does not appear to have PE as cause of shortness of breath.  Will treat for bronchitis, monitor swelling and shortness of breath, follow-up if not improving or resolving.  Will bronchitis is more likely given patient already on Xarelto.  Final Clinical Impressions(s) / UC Diagnoses   Final diagnoses:  Acute bronchitis, unspecified organism  Wheezing     Discharge Instructions     No pneumonia or fluid on chest xray Begin prednisone daily for 5 days Inhaler or nebulizers as needed  Please follow up in emergency room if having worsening shortness of breath, leg swelling, chest discomfort. Return here or PCP if symptoms not resolving.   ED Prescriptions    Medication Sig Dispense Auth. Provider   predniSONE (DELTASONE) 50 MG tablet Take 1 tablet (50 mg total) by mouth daily for 5 days. 5 tablet Markiya Keefe C, PA-C   ipratropium-albuterol (DUONEB) 0.5-2.5 (3) MG/3ML SOLN Take 3 mLs by nebulization every 6 (six) hours as needed. 150 mL Jeriah Skufca, [place] C, PA-C   Respiratory Therapy Supplies (NEBULIZER) DEVI 1 application by Does not apply route once for 1 dose. 1 each Panzy Bubeck, Elesa Hacker, PA-C     Controlled Substance Prescriptions [place] Controlled Substance Registry consulted? Not Applicable   Janith Lima, [place] 05/15/18 1217

## 2018-06-01 ENCOUNTER — Ambulatory Visit: Payer: Self-pay

## 2018-06-19 ENCOUNTER — Ambulatory Visit: Payer: Self-pay

## 2018-06-27 ENCOUNTER — Emergency Department (HOSPITAL_COMMUNITY)
Admission: EM | Admit: 2018-06-27 | Discharge: 2018-06-27 | Disposition: A | Discharge status: Home / Self Care | Payer: Medicaid Other | Attending: Emergency Medicine | Admitting: Emergency Medicine

## 2018-06-27 ENCOUNTER — Encounter (HOSPITAL_COMMUNITY): Payer: Self-pay

## 2018-06-27 ENCOUNTER — Emergency Department (HOSPITAL_COMMUNITY): Payer: Medicaid Other

## 2018-06-27 DIAGNOSIS — Z7982 Long term (current) use of aspirin: Secondary | ICD-10-CM | POA: Diagnosis not present

## 2018-06-27 DIAGNOSIS — I10 Essential (primary) hypertension: Secondary | ICD-10-CM | POA: Diagnosis not present

## 2018-06-27 DIAGNOSIS — R51 Headache: Secondary | ICD-10-CM | POA: Diagnosis present

## 2018-06-27 DIAGNOSIS — Z79899 Other long term (current) drug therapy: Secondary | ICD-10-CM | POA: Diagnosis not present

## 2018-06-27 DIAGNOSIS — R519 Headache, unspecified: Secondary | ICD-10-CM

## 2018-06-27 LAB — COMPREHENSIVE METABOLIC PANEL
ALT: 13 U/L (ref 0–44)
AST: 21 U/L (ref 15–41)
Albumin: 3.8 g/dL (ref 3.5–5.0)
Alkaline Phosphatase: 32 U/L — ABNORMAL LOW (ref 38–126)
Anion gap: 9 (ref 5–15)
BUN: 15 mg/dL (ref 8–23)
CO2: 31 mmol/L (ref 22–32)
Calcium: 9.5 mg/dL (ref 8.9–10.3)
Chloride: 96 mmol/L — ABNORMAL LOW (ref 98–111)
Creatinine, Ser: 1.23 mg/dL — ABNORMAL HIGH (ref 0.44–1.00)
GFR calc non Af Amer: 43 mL/min — ABNORMAL LOW (ref >60)
GFR, EST AFRICAN AMERICAN: 50 mL/min — AB (ref >60)
Glucose, Bld: 92 mg/dL (ref 70–99)
POTASSIUM: 3.6 mmol/L (ref 3.5–5.1)
SODIUM: 136 mmol/L (ref 135–145)
Total Bilirubin: 0.7 mg/dL (ref 0.3–1.2)
Total Protein: 7.3 g/dL (ref 6.5–8.1)

## 2018-06-27 LAB — I-STAT TROPONIN, ED: Troponin i, poc: 0 ng/mL (ref 0.00–0.08)

## 2018-06-27 LAB — PROTIME-INR
INR: 1.74
Prothrombin Time: 20.2 seconds — ABNORMAL HIGH (ref 11.4–15.2)

## 2018-06-27 LAB — INFLUENZA PANEL BY PCR (TYPE A & B)
Influenza A By PCR: NEGATIVE
Influenza B By PCR: NEGATIVE

## 2018-06-27 LAB — CBC
HEMATOCRIT: 31.8 % — AB (ref 36.0–46.0)
Hemoglobin: 10.3 g/dL — ABNORMAL LOW (ref 12.0–15.0)
MCH: 31.1 pg (ref 26.0–34.0)
MCHC: 32.4 g/dL (ref 30.0–36.0)
MCV: 96.1 fL (ref 80.0–100.0)
NRBC: 0 % (ref 0.0–0.2)
PLATELETS: 133 10*3/uL — AB (ref 150–400)
RBC: 3.31 MIL/uL — ABNORMAL LOW (ref 3.87–5.11)
RDW: 12.8 % (ref 11.5–15.5)
WBC: 4.7 10*3/uL (ref 4.0–10.5)

## 2018-06-27 MED ORDER — OXYCODONE-ACETAMINOPHEN 5-325 MG PO TABS
2.0000 | ORAL_TABLET | Freq: Once | ORAL | Status: AC
  Administered 2018-06-27: 2 via ORAL
  Filled 2018-06-27: qty 2

## 2018-06-27 NOTE — ED Provider Notes (Signed)
[facility] EMERGENCY DEPARTMENT Provider Note   CSN: [id number] Arrival date & time: 06/27/18  1625     History   Chief Complaint No chief complaint on file.   HPI Morgan Bowers is a 73 y.o. female.  HPI  73 yo female complaining of headache for one week with some associated cough and nasal congestion which began in the preceding week.  She denies any numbness, tingling, difficulty walking, seeing, speaking, or lateralized weakness.  She feels that she may have had some fever at home but has not taken her temperature.  Is not complaining of any dyspnea, nausea, vomiting, or diarrhea.  Denies any fall or trauma to her head.  She is not on any blood thinners.  Also has some associated chest discomfort with coughing.  Past Medical History:  Diagnosis Date  . A-fib ([place])   . Anemia   . Arthritis    "knees" (09/03/2016)  . GERD (gastroesophageal reflux disease)   . High cholesterol   . Hypertension     Patient Active Problem List   Diagnosis Date Noted  . 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 Atrial fibrillation with controlled ventricular rate ([place]) 06/17/2016  . Pulmonary nodule 06/17/2016  . Anemia 06/17/2016  . Thoracic compression fracture ([place]) 06/16/2016  . Dyspnea 02/25/2016  . PFO (patent foramen ovale) 02/25/2016    Past Surgical History:  Procedure Laterality Date  . CARDIOVERSION N/A 08/06/2016   Procedure: CARDIOVERSION;  Surgeon: [name], MD;  Location: [facility];  Service: Cardiovascular;  Laterality: N/A;  . COLONOSCOPY WITH PROPOFOL N/A 08/13/2017   Procedure: COLONOSCOPY WITH PROPOFOL;  Surgeon: Laurence Spates, MD;  Location: [facility];  Service: Endoscopy;  Laterality: N/A;  . ESOPHAGOGASTRODUODENOSCOPY (EGD) WITH PROPOFOL N/A 08/13/2017   Procedure: ESOPHAGOGASTRODUODENOSCOPY (EGD) WITH PROPOFOL;  Surgeon: Laurence Spates, MD;  Location: [facility];  Service: Endoscopy;  Laterality:  N/A;  . IR GENERIC HISTORICAL  06/19/2016   IR VERTEBROPLASTY CERV/THOR BX INC UNI/BIL INC/INJECT/IMAGING 06/19/2016 Luanne Bras, MD [facility]  . IR GENERIC HISTORICAL  06/19/2016   IR VERTEBROPLASTY EA ADDL (T&LS) BX INC UNI/BIL INC INJECT/IMAGING 06/19/2016 Luanne Bras, MD [facility]  . PATENT FORAMEN OVALE CLOSURE  09/03/2016  . PATENT FORAMEN OVALE(PFO) CLOSURE N/A 09/03/2016   Procedure: Patent Forament [place](PFO) Closure;  Surgeon: [name], MD;  Location: [facility];  Service: Cardiovascular;  Laterality: N/A;  . TEE WITHOUT CARDIOVERSION N/A 02/29/2016   Procedure: TRANSESOPHAGEAL ECHOCARDIOGRAM (TEE);  Surgeon: [name], MD;  Location: [facility] ENDOSCOPY;  Service: Cardiovascular;  Laterality: N/A;     OB History   No obstetric history on file.      Home Medications    Prior to Admission medications   Medication Sig Start Date End Date Taking? Authorizing Provider  acetaminophen (TYLENOL) 325 MG tablet Take 650 mg by mouth daily as needed for moderate pain or headache.    [provider]  aspirin 81 MG chewable tablet Chew 1 tablet (81 mg total) by mouth daily. 09/04/16   Miquel Dunn, NP  atorvastatin (LIPITOR) 20 MG tablet Take 20 mg by mouth daily.    [provider]  cycloSPORINE (RESTASIS) 0.05 % ophthalmic emulsion Place 1 drop into both eyes 2 (two) times daily.    [provider]  diclofenac sodium (VOLTAREN) 1 % GEL Apply 1 application topically daily as needed (for pain).    [provider]  Ferrous Sulfate (SLOW RELEASE  IRON PO) Take 1 tablet by mouth daily.    [provider]  ipratropium-albuterol (DUONEB) 0.5-2.5 (3) MG/3ML SOLN Take 3 mLs by nebulization every 6 (six) hours as needed. 05/14/18   Wieters, Hallie C, PA-C  metoprolol succinate (TOPROL-XL) 50 MG 24 hr tablet Take 50 mg by mouth daily. Take with or immediately following a meal.    [provider]  PROAIR HFA 108 (90 Base)  MCG/ACT inhaler Inhale 2 puffs into the lungs 4 (four) times daily as needed for shortness of breath or wheezing. 08/04/17   [provider]  triamterene-hydrochlorothiazide (MAXZIDE-25) 37.5-25 MG tablet Take 1 tablet by mouth daily.    [provider]  valsartan (DIOVAN) 320 MG tablet Take 320 mg by mouth daily.    [provider]  Vitamin D, Ergocalciferol, (DRISDOL) 50000 units CAPS capsule Take 50,000 Units by mouth every Wednesday.    [provider]    Family History No family history on file.  Social History Social History   Tobacco Use  . Smoking status: Never Smoker  . Smokeless tobacco: Never Used  Substance Use Topics  . Alcohol use: No  . Drug use: No     Allergies   Patient has no known allergies.   Review of Systems Review of Systems  All other systems reviewed and are negative.    Physical Exam Updated Vital Signs BP (!) 158/84 (BP Location: Right Arm)   Pulse 64   Temp 97.9 F (36.6 C) (Oral)   Resp 18   SpO2 99%   Physical Exam Vitals signs and nursing note reviewed.  Constitutional:      General: She is not in acute distress.    Appearance: Normal appearance. She is normal weight. She is not ill-appearing.  HENT:     Head: Normocephalic and atraumatic.     Right Ear: External ear normal.     Left Ear: External ear normal.     Nose: Nose normal.     Mouth/Throat:     Mouth: Mucous membranes are moist.     Pharynx: Oropharynx is clear.  Eyes:     Pupils: Pupils are equal, round, and reactive to light.  Neck:     Musculoskeletal: Normal range of motion.  Cardiovascular:     Rate and Rhythm: Normal rate and regular rhythm.     Pulses: Normal pulses.  Pulmonary:     Effort: Pulmonary effort is normal.     Breath sounds: Normal breath sounds.  Abdominal:     General: Abdomen is flat. Bowel sounds are normal.     Palpations: Abdomen is soft.  Musculoskeletal: Normal range of motion.  Skin:    General:  Skin is warm and dry.     Capillary Refill: Capillary refill takes less than 2 seconds.  Neurological:     General: No focal deficit present.     Mental Status: She is alert. Mental status is at baseline. She is disoriented.     Cranial Nerves: No cranial nerve deficit.     Sensory: No sensory deficit.     Motor: No weakness.     Coordination: Coordination normal.  Psychiatric:        Mood and Affect: Mood normal.        Behavior: Behavior normal.      ED Treatments / Results  Labs (all labs ordered are listed, but only abnormal results are displayed) Labs Reviewed  CBC - Abnormal; Notable for the following components:  Result Value   RBC 3.31 (*)    Hemoglobin 10.3 (*)    HCT 31.8 (*)    Platelets 133 (*)    All other components within normal limits  COMPREHENSIVE METABOLIC PANEL - Abnormal; Notable for the following components:   Chloride 96 (*)    Creatinine, Ser 1.23 (*)    Alkaline Phosphatase 32 (*)    GFR calc non Af Amer 43 (*)    GFR calc Af Amer 50 (*)    All other components within normal limits  PROTIME-INR - Abnormal; Notable for the following components:   Prothrombin Time 20.2 (*)    All other components within normal limits  INFLUENZA PANEL BY PCR (TYPE A & B)  I-STAT TROPONIN, ED    EKG EKG Interpretation  Date/Time:  Saturday June 27 2018 17:55:30 EST Ventricular Rate:  55 PR Interval:    QRS Duration: 103 QT Interval:  438 QTC Calculation: 419 R Axis:   29 Text Interpretation:  Sinus rhythm Prolonged PR interval Abnormal R-wave progression, early transition Confirmed by Pattricia Boss [phone]) on 06/27/2018 7:47:19 PM   Radiology Ct Head Wo Contrast  Result Date: 06/27/2018 CLINICAL DATA:  Headache for 8 days, cough for 3 weeks, history hypertension, atrial fibrillation, GERD EXAM: CT HEAD WITHOUT CONTRAST TECHNIQUE: Contiguous axial images were obtained from the base of the skull through the vertex without intravenous contrast.  Sagittal and coronal MPR images reconstructed from axial data set. COMPARISON:  10/28/2015 FINDINGS: Brain: Minimal atrophy. Normal ventricular morphology. No midline shift or mass effect. Otherwise normal appearance of brain parenchyma. No intracranial hemorrhage, mass lesion, or evidence of acute infarction. No extra-axial fluid collections. Vascular: Minimal atherosclerotic calcifications at skull base Skull: Calvaria intact Sinuses/Orbits: Clear Other: N/A IMPRESSION: No acute intracranial abnormalities. Electronically Signed   By: Lavonia Dana M.D.   On: 06/27/2018 18:54   Dg Chest Port 1 View  Result Date: 06/27/2018 CLINICAL DATA:  Cough, congestion and central chest pain for 2 weeks, headache, history atrial fibrillation, hypertension, GERD EXAM: PORTABLE CHEST 1 VIEW COMPARISON:  Portable exam 1804 hours compared to 05/14/2018 FINDINGS: Normal heart size and pulmonary vascularity. Tortuous thoracic aorta. Minimal LEFT basilar atelectasis. Lungs otherwise clear. No infiltrate, pleural effusion or pneumothorax. Bones diffusely demineralized with prior adjacent spinal augmentation procedures in the midthoracic spine. IMPRESSION: Minimal LEFT basilar atelectasis. Electronically Signed   By: Lavonia Dana M.D.   On: 06/27/2018 18:52    Procedures Procedures (including critical care time)  Medications Ordered in ED Medications  oxyCODONE-acetaminophen (PERCOCET/ROXICET) 5-325 MG per tablet 2 tablet (2 tablets Oral Given 06/27/18 1841)     Initial Impression / Assessment and Plan / ED Course  I have reviewed the triage vital signs and the nursing notes.  Pertinent labs & imaging results that were available during my care of the patient were reviewed by me and considered in my medical decision making (see chart for details).     73 year old female presents today complaining of headache, cough, congestion.  She was evaluated with a head CT due to age and some difficulty communicating.  She did not  have any focal neurological deficits.  Head CT reveals no evidence of acute intracranial abnormality.  She had labs obtained which showed some mild anemia but no other acute abnormalities.  I do not think she has meningitis.  She also had some cough congestion chest discomfort.  EKG shows no evidence of acute abnormality and the troponin is not elevated.  Here she  feels much improved after 2 Percocet.  I discussed the results of her tests.  We have discussed return precautions such as worsening chest pain, headache, fever, or chills.  Otherwise, she is to follow-up with her primary care physician next week.  There is discussed with patient and her son.  They voiced understanding of plan   Final Clinical Impressions(s) / ED Diagnoses   Final diagnoses:  Acute nonintractable headache, unspecified headache type    ED Discharge Orders    None       Pattricia Boss, MD 06/27/18 [place]

## 2018-06-27 NOTE — Discharge Instructions (Addendum)
Your headache was evaluated here and a head CT obtained.  There appears to be no acute abnormalities on your head CT. Blood work was obtained and you are slightly anemic but this is stable from previous exams with a hemoglobin of 10 No evidence of infection was seen in your lab work and a flu test was negative. If you are worse at any time especially worsening headache, chest pain, or shortness of breath, please return to the emergency department. Otherwise please follow-up with your primary care doctor next week

## 2018-06-27 NOTE — ED Triage Notes (Signed)
Patient complains of 1 week of frontal headache. States that 1 week prior had cough and congestion. Denies trauma. Alert and oriented, NAD

## 2018-06-27 NOTE — ED Notes (Signed)
Attempted IV start, x2 unsuccessful. Primary RN made aware

## 2018-07-17 ENCOUNTER — Ambulatory Visit
Admission: RE | Admit: 2018-07-17 | Discharge: 2018-07-17 | Disposition: A | Discharge status: Home / Self Care | Payer: Medicaid Other | Source: Ambulatory Visit | Attending: Internal Medicine | Admitting: Internal Medicine

## 2018-07-17 DIAGNOSIS — Z1231 Encounter for screening mammogram for malignant neoplasm of breast: Secondary | ICD-10-CM

## 2018-07-27 ENCOUNTER — Other Ambulatory Visit: Payer: Self-pay

## 2018-07-27 MED ORDER — ATORVASTATIN CALCIUM 20 MG PO TABS: 20.0000 mg | ORAL_TABLET | Freq: Every day | ORAL | 2 refills | Status: DC

## 2018-08-03 ENCOUNTER — Other Ambulatory Visit: Payer: Self-pay

## 2018-08-03 MED ORDER — XARELTO 20 MG PO TABS: 20.0000 mg | ORAL_TABLET | Freq: Every day | ORAL | 1 refills | Status: DC

## 2018-08-28 ENCOUNTER — Other Ambulatory Visit: Payer: Self-pay | Admitting: Cardiology

## 2018-08-28 DIAGNOSIS — Z8774 Personal history of (corrected) congenital malformations of heart and circulatory system: Secondary | ICD-10-CM

## 2018-09-11 ENCOUNTER — Other Ambulatory Visit: Payer: Self-pay

## 2018-09-11 DIAGNOSIS — I4891 Unspecified atrial fibrillation: Secondary | ICD-10-CM

## 2018-09-14 MED ORDER — METOPROLOL SUCCINATE ER 50 MG PO TB24: 50.0000 mg | ORAL_TABLET | Freq: Every day | ORAL | 3 refills | Status: DC

## 2018-11-18 ENCOUNTER — Other Ambulatory Visit: Payer: Self-pay

## 2018-11-18 ENCOUNTER — Ambulatory Visit (INDEPENDENT_AMBULATORY_CARE_PROVIDER_SITE_OTHER): Payer: Medicaid Other

## 2018-11-18 DIAGNOSIS — Z09 Encounter for follow-up examination after completed treatment for conditions other than malignant neoplasm: Secondary | ICD-10-CM | POA: Diagnosis not present

## 2018-11-18 DIAGNOSIS — Z8774 Personal history of (corrected) congenital malformations of heart and circulatory system: Secondary | ICD-10-CM | POA: Diagnosis not present

## 2018-11-27 DIAGNOSIS — I10 Essential (primary) hypertension: Secondary | ICD-10-CM | POA: Insufficient documentation

## 2018-11-27 DIAGNOSIS — D61818 Other pancytopenia: Secondary | ICD-10-CM | POA: Insufficient documentation

## 2018-11-27 NOTE — Progress Notes (Signed)
Subjective:  Primary Physician:  Nolene Ebbs, MD  Patient ID: Morgan Bowers, female    DOB: 14-Sep-1945, 73 y.o.   MRN: [id number]  This visit type was conducted due to national recommendations for restrictions regarding the COVID-19 Pandemic (e.g. social distancing).  This format is felt to be most appropriate for this patient at this time.  All issues noted in this document were discussed and addressed.  No physical exam was performed (except for noted visual exam findings with Telehealth visits - very limited).  The patient has consented to conduct a Telehealth visit and understands insurance will be billed.   I connected with patient, on 11/30/18  by a video enabled telemedicine application and verified that I am speaking with the correct person using two identifiers.     I discussed the limitations of evaluation and management by telemedicine and the availability of in person appointments. The patient expressed understanding and agreed to proceed.   I have discussed with patient regarding the safety during COVID Pandemic and steps and precautions including social distancing with the patient.    Chief Complaint  Patient presents with  . pfo closure    echo results    HPI: Morgan Bowers  is a 73 y.o. female who presents for a follow-up for Atrial fibrillation, history of PFO closure, hypertension.  She does not have any chest pain but has exertional dyspnea on walking or going upstairs, unchanged from before. No orthopnea, PND. No complaints of palpitation, sudden heart racing or irregular heartbeat. She denies any dizziness, near-syncope or syncope. No symptoms of TIA. She has mild chronic swelling on the legs.  No history of hematuria, GI bleed or bleeding from any other site. No history of excessive bruising.  Patient has Hypertension, well controlled with therapy. She also has hyperlipidemia, on atorvastatin therapy. No history of diabetes. She does not smoke.  She walks for about 30 minutes daily.  Patient has chronic low backache. She has h/o pancytopenia. EGD was negative in April 2019. She had a colon polyp removed in April 2019.    Past Medical History:  Diagnosis Date  . A-fib ([place])   . Anemia   . Arthritis    "knees" (09/03/2016)  . GERD (gastroesophageal reflux disease)   . High cholesterol   . Hypertension     Past Surgical History:  Procedure Laterality Date  . CARDIOVERSION N/A 08/06/2016   Procedure: CARDIOVERSION;  Surgeon: [name], MD;  Location: [place];  Service: Cardiovascular;  Laterality: N/A;  . COLONOSCOPY WITH PROPOFOL N/A 08/13/2017   Procedure: COLONOSCOPY WITH PROPOFOL;  Surgeon: Laurence Spates, MD;  Location: [place];  Service: Endoscopy;  Laterality: N/A;  . ESOPHAGOGASTRODUODENOSCOPY (EGD) WITH PROPOFOL N/A 08/13/2017   Procedure: ESOPHAGOGASTRODUODENOSCOPY (EGD) WITH PROPOFOL;  Surgeon: Laurence Spates, MD;  Location: [place];  Service: Endoscopy;  Laterality: N/A;  . IR GENERIC HISTORICAL  06/19/2016   IR VERTEBROPLASTY CERV/THOR BX INC UNI/BIL INC/INJECT/IMAGING 06/19/2016 Luanne Bras, MD [facility]  . IR GENERIC HISTORICAL  06/19/2016   IR VERTEBROPLASTY EA ADDL (T&LS) BX INC UNI/BIL INC INJECT/IMAGING 06/19/2016 Luanne Bras, MD [facility]  . PATENT FORAMEN OVALE CLOSURE  09/03/2016  . PATENT FORAMEN OVALE(PFO) CLOSURE N/A 09/03/2016   Procedure: Patent Forament Ovale(PFO) Closure;  Surgeon: [name], MD;  Location: [facility];  Service: Cardiovascular;  Laterality: N/A;  . TEE WITHOUT CARDIOVERSION N/A 02/29/2016   Procedure: TRANSESOPHAGEAL ECHOCARDIOGRAM (TEE);  Surgeon: [name], MD;  Location: [place];  Service: Cardiovascular;  Laterality: N/A;    Social History   Socioeconomic History  . Marital status: Widowed    Spouse name: Not on file  . Number of children: Not on file  . Years of education: Not on file  . Highest education level: Not on file   Occupational History  . Occupation: retired  Scientific laboratory technician  . Financial resource strain: Not on file  . Food insecurity    Worry: Not on file    Inability: Not on file  . Transportation needs    Medical: Not on file    Non-medical: Not on file  Tobacco Use  . Smoking status: Never Smoker  . Smokeless tobacco: Never Used  Substance and Sexual Activity  . Alcohol use: No  . Drug use: No  . Sexual activity: Never  Lifestyle  . Physical activity    Days per week: Not on file    Minutes per session: Not on file  . Stress: Not on file  Relationships  . Social Herbalist on phone: Not on file    Gets together: Not on file    Attends religious service: Not on file    Active member of club or organization: Not on file    Attends meetings of clubs or organizations: Not on file    Relationship status: Not on file  . Intimate partner violence    Fear of current or ex partner: Not on file    Emotionally abused: Not on file    Physically abused: Not on file    Forced sexual activity: Not on file  Other Topics Concern  . Not on file  Social History Narrative   From [place].    Current Outpatient Medications on File Prior to Visit  Medication Sig Dispense Refill  . acetaminophen (TYLENOL) 325 MG tablet Take 650 mg by mouth daily as needed for moderate pain or headache.    Marland Kitchen atorvastatin (LIPITOR) 20 MG tablet Take 1 tablet (20 mg total) by mouth daily. 90 tablet 2  . Ferrous Sulfate (SLOW RELEASE IRON PO) Take 1 tablet by mouth daily.    Marland Kitchen ipratropium-albuterol (DUONEB) 0.5-2.5 (3) MG/3ML SOLN Take 3 mLs by nebulization every 6 (six) hours as needed. 150 mL 0  . metoprolol succinate (TOPROL-XL) 50 MG 24 hr tablet Take 1 tablet (50 mg total) by mouth daily. Take with or immediately following a meal. 90 tablet 3  . PROAIR HFA 108 (90 Base) MCG/ACT inhaler Inhale 2 puffs into the lungs 4 (four) times daily as needed for shortness of breath or wheezing.  2  .  triamterene-hydrochlorothiazide (MAXZIDE-25) 37.5-25 MG tablet Take 1 tablet by mouth daily.    . Vitamin D, Ergocalciferol, (DRISDOL) 50000 units CAPS capsule Take 50,000 Units by mouth every Wednesday.    Alveda Reasons 20 MG TABS tablet Take 1 tablet (20 mg total) by mouth daily. 90 tablet 1   No current facility-administered medications on file prior to visit.     Review of Systems  Constitutional: Negative for fever.  HENT: Negative for nosebleeds.   Eyes: Negative for blurred vision.  Respiratory: Positive for shortness of breath (on exertion). Negative for cough.   Cardiovascular: Positive for leg swelling (mild, chronic). Negative for chest pain and palpitations.  Gastrointestinal: Negative for abdominal pain, nausea and vomiting.  Genitourinary: Negative for dysuria.  Musculoskeletal: Negative for myalgias.  Skin: Negative for itching and rash.  Neurological: Negative for dizziness, seizures and loss of consciousness.  Psychiatric/Behavioral: The patient is not nervous/anxious.      Objective:  Blood pressure 110/71, pulse 72, height 5\' 4"  (1.626 m), weight 174 lb (78.9 kg). Body mass index is 29.87 kg/m.  Physical Exam: Patient is alert and oriented, appeared very comfortable talking to me during the visit. No further detailed physical examination was possible as it was a telemedicine visit.  CARDIAC STUDIES:  Echocardiogram 11/18/2018: Normal LV systolic function with EF 62%. Left ventricle cavity is normal in size. Normal left ventricular wall thickness. Normal global wall motion. Doppler evidence of grade II (pseudonormal) diastolic dysfunction, elevated LAP. Calculated EF 62%. Trace aortic regurgitation. Moderate (Grade III) mitral regurgitation. Mild to moderate tricuspid regurgitation. Estimated pulmonary artery systolic pressure is 37 mmHg.  Compared to previous study in 2018, filling pressures are increased. Mild PH is new.   EKG- 05/18/2018- Sinus rhythm, PAC, PR  interval is at upper limits of normal.  Assessment & Recommendations:   1. Atrial fibrillation, currently in sinus rhythm A. Fib. cardioverted to sinus rhythm on 08/06/2016. Presently in sinus rhytm. CHA2DS2-VASc score- 3 with yearly risk of stroke-3.2%  2. Status post patent foramen ovale closure  3. Non-rheumatic mitral regurgitation  4. Essential hypertension  5. Pancytopenia [facility])  Laboratory Exam:  CBC Latest Ref Rng & Units 06/27/2018 07/08/2016 06/18/2016  WBC 4.0 - 10.5 K/uL 4.7 3.0(L) 3.7(L)  Hemoglobin 12.0 - 15.0 g/dL 10.3(L) 10.8(L) 11.0(L)  Hematocrit 36.0 - 46.0 % 31.8(L) 33.2(L) 34.7(L)  Platelets 150 - 400 K/uL 133(L) 157 151   CMP Latest Ref Rng & Units 06/27/2018 07/08/2016 06/18/2016  Glucose 70 - 99 mg/dL 92 86 100(H)  BUN 8 - 23 mg/dL 15 10 7   Creatinine 0.44 - 1.00 mg/dL 1.23(H) 0.71 0.70  Sodium 135 - 145 mmol/L 136 139 137  Potassium 3.5 - 5.1 mmol/L 3.6 4.5 4.1  Chloride 98 - 111 mmol/L 96(L) 101 99(L)  CO2 22 - 32 mmol/L 31 31 30   Calcium 8.9 - 10.3 mg/dL 9.5 9.8 9.5  Total Protein 6.5 - 8.1 g/dL 7.3 - -  Total Bilirubin 0.3 - 1.2 mg/dL 0.7 - -  Alkaline Phos 38 - 126 U/L 32(L) - -  AST 15 - 41 U/L 21 - -  ALT 0 - 44 U/L 13 - -   Lipid Panel   Recommendation:  Results of the recent echocardiogram were explained to the patient. She had successful PFO closure. Patient has chronic edema of the legs and feet, has bilateral varicosities. She was again advised to keep the legs elevated while lying down and wear compression stockings.  Primary prevention was discussed. She was advised to follow low-salt, low-cholesterol diet and was encouraged to continue  walking regularly as tolerated.  Return for follow-up after 6 months but call us earlier if there are any cardiac problems. She will continue to have all the blood tests at her PCP's office.   Despina Hick, MD, [facility] 11/30/2018, 11:00 AM [facility]. [place] Pager: [phone] Office:  [phone] If no answer Cell [phone]

## 2018-11-30 ENCOUNTER — Ambulatory Visit: Payer: Self-pay | Admitting: Cardiology

## 2018-11-30 ENCOUNTER — Encounter: Payer: Self-pay | Admitting: Cardiology

## 2018-11-30 ENCOUNTER — Other Ambulatory Visit: Payer: Self-pay

## 2018-11-30 VITALS — BP 110/71 | HR 72 | Ht 64.0 in | Wt 174.0 lb

## 2018-11-30 DIAGNOSIS — D61818 Other pancytopenia: Secondary | ICD-10-CM

## 2018-11-30 DIAGNOSIS — Z8679 Personal history of other diseases of the circulatory system: Secondary | ICD-10-CM | POA: Diagnosis not present

## 2018-11-30 DIAGNOSIS — Z8774 Personal history of (corrected) congenital malformations of heart and circulatory system: Secondary | ICD-10-CM

## 2018-11-30 DIAGNOSIS — I340 Nonrheumatic mitral (valve) insufficiency: Secondary | ICD-10-CM

## 2018-11-30 DIAGNOSIS — I10 Essential (primary) hypertension: Secondary | ICD-10-CM | POA: Diagnosis not present

## 2018-12-24 ENCOUNTER — Other Ambulatory Visit: Payer: Self-pay

## 2018-12-24 MED ORDER — TRIAMTERENE-HCTZ 37.5-25 MG PO TABS: 1.0000 | ORAL_TABLET | Freq: Every day | ORAL | 1 refills | Status: DC

## 2019-01-26 ENCOUNTER — Encounter: Payer: Self-pay | Admitting: Podiatry

## 2019-01-26 ENCOUNTER — Ambulatory Visit: Payer: Medicaid Other | Admitting: Podiatry

## 2019-01-26 ENCOUNTER — Other Ambulatory Visit: Payer: Self-pay

## 2019-01-26 ENCOUNTER — Ambulatory Visit (INDEPENDENT_AMBULATORY_CARE_PROVIDER_SITE_OTHER): Payer: Medicaid Other

## 2019-01-26 VITALS — BP 127/53 | HR 66 | Resp 16

## 2019-01-26 DIAGNOSIS — M898X7 Other specified disorders of bone, ankle and foot: Secondary | ICD-10-CM

## 2019-01-26 DIAGNOSIS — M216X1 Other acquired deformities of right foot: Secondary | ICD-10-CM | POA: Diagnosis not present

## 2019-01-26 NOTE — Patient Instructions (Signed)
Pre-Operative Instructions  Congratulations, you have decided to take an important step towards improving your quality of life.  You can be assured that the doctors and staff at [facility] will be with you every step of the way.  Here are some important things you should know:  1. Plan to be at the surgery center/hospital at least 1 (one) hour prior to your scheduled time, unless otherwise directed by the surgical center/hospital staff.  You must have a responsible adult accompany you, remain during the surgery and drive you home.  Make sure you have directions to the surgical center/hospital to ensure you arrive on time. 2. If you are having surgery at [facility] or [facility] hospitals, you will need a copy of your medical history and physical form from your family physician within one month prior to the date of surgery. We will give you a form for your primary physician to complete.  3. We make every effort to accommodate the date you request for surgery.  However, there are times where surgery dates or times have to be moved.  We will contact you as soon as possible if a change in schedule is required.   4. No aspirin/ibuprofen for one week before surgery.  If you are on aspirin, any non-steroidal anti-inflammatory medications (Mobic, Aleve, Ibuprofen) should not be taken seven (7) days prior to your surgery.  You make take Tylenol for pain prior to surgery.  5. Medications - If you are taking daily heart and blood pressure medications, seizure, reflux, allergy, asthma, anxiety, pain or diabetes medications, make sure you notify the surgery center/hospital before the day of surgery so they can tell you which medications you should take or avoid the day of surgery. 6. No food or drink after midnight the night before surgery unless directed otherwise by surgical center/hospital staff. 7. No alcoholic beverages 24-hours prior to surgery.  No smoking 24-hours prior or 24-hours after  surgery. 8. Wear loose pants or shorts. They should be loose enough to fit over bandages, boots, and casts. 9. Don't wear slip-on shoes. Sneakers are preferred. 10. Bring your boot with you to the surgery center/hospital.  Also bring crutches or a walker if your physician has prescribed it for you.  If you do not have this equipment, it will be provided for you after surgery. 11. If you have not been contacted by the surgery center/hospital by the day before your surgery, call to confirm the date and time of your surgery. 12. Leave-time from work may vary depending on the type of surgery you have.  Appropriate arrangements should be made prior to surgery with your employer. 13. Prescriptions will be provided immediately following surgery by your doctor.  Fill these as soon as possible after surgery and take the medication as directed. Pain medications will not be refilled on weekends and must be approved by the doctor. 14. Remove nail polish on the operative foot and avoid getting pedicures prior to surgery. 15. Wash the night before surgery.  The night before surgery wash the foot and leg well with water and the antibacterial soap provided. Be sure to pay special attention to beneath the toenails and in between the toes.  Wash for at least three (3) minutes. Rinse thoroughly with water and dry well with a towel.  Perform this wash unless told not to do so by your physician.  Enclosed: 1 Ice pack (please put in freezer the night before surgery)   1 Hibiclens skin cleaner     Pre-op instructions  If you have any questions regarding the instructions, please do not hesitate to call our office.  [place]: [address], [place], [place] [address] -- [phone]  [place]: [address] [place], [place] [address] -- [phone]  [place]: [address]  [place], [place] [address] -- [phone]   Website: [email or web address] 

## 2019-01-26 NOTE — Progress Notes (Signed)
Subjective:  Patient ID: Morgan Bowers, female    DOB: November 21, 1945,  MRN: [id number] HPI Chief Complaint  Patient presents with   Toe Pain    5th toe right - tender, callused area lateral nail border x 2 months, unable to wear closed shoes, pain with pressure, no treatment   New Patient (Initial Visit)    73 y.o. female presents with the above complaint.   ROS: She denies fever chills nausea vomiting muscles muscle aches pains calf pain back pain chest pain shortness of breath.  Denies headaches.  Past Medical History:  Diagnosis Date   A-fib ([place])    Anemia    Arthritis    "knees" (09/03/2016)   GERD (gastroesophageal reflux disease)    High cholesterol    Hypertension    Past Surgical History:  Procedure Laterality Date   CARDIOVERSION N/A 08/06/2016   Procedure: CARDIOVERSION;  Surgeon: [name], MD;  Location: [place];  Service: Cardiovascular;  Laterality: N/A;   COLONOSCOPY WITH PROPOFOL N/A 08/13/2017   Procedure: COLONOSCOPY WITH PROPOFOL;  Surgeon: Laurence Spates, MD;  Location: [facility] ENDOSCOPY;  Service: Endoscopy;  Laterality: N/A;   ESOPHAGOGASTRODUODENOSCOPY (EGD) WITH PROPOFOL N/A 08/13/2017   Procedure: ESOPHAGOGASTRODUODENOSCOPY (EGD) WITH PROPOFOL;  Surgeon: Laurence Spates, MD;  Location: [place];  Service: Endoscopy;  Laterality: N/A;   IR GENERIC HISTORICAL  06/19/2016   IR VERTEBROPLASTY CERV/THOR BX INC UNI/BIL INC/INJECT/IMAGING 06/19/2016 Luanne Bras, MD [facility]   IR GENERIC HISTORICAL  06/19/2016   IR VERTEBROPLASTY EA ADDL (T&LS) BX INC UNI/BIL INC INJECT/IMAGING 06/19/2016 Luanne Bras, MD [facility]   PATENT FORAMEN OVALE CLOSURE  09/03/2016   PATENT FORAMEN OVALE(PFO) CLOSURE N/A 09/03/2016   Procedure: Patent Forament Ovale(PFO) Closure;  Surgeon: [name], MD;  Location: [place] [facility];  Service: Cardiovascular;  Laterality: N/A;   TEE WITHOUT CARDIOVERSION N/A 02/29/2016   Procedure: TRANSESOPHAGEAL  ECHOCARDIOGRAM (TEE);  Surgeon: [name], MD;  Location: [facility] ENDOSCOPY;  Service: Cardiovascular;  Laterality: N/A;    Current Outpatient Medications:    acetaminophen (TYLENOL) 325 MG tablet, Take 650 mg by mouth daily as needed for moderate pain or headache., Disp: , Rfl:    atorvastatin (LIPITOR) 20 MG tablet, Take 1 tablet (20 mg total) by mouth daily., Disp: 90 tablet, Rfl: 2   ferrous sulfate 325 (65 FE) MG tablet, Take 325 mg by mouth daily., Disp: , Rfl:    ipratropium-albuterol (DUONEB) 0.5-2.5 (3) MG/3ML SOLN, Take 3 mLs by nebulization every 6 (six) hours as needed., Disp: 150 mL, Rfl: 0   losartan (COZAAR) 25 MG tablet, TAKE 1 TABLET EVERY DAY FOR BLOOD PRESSURE, Disp: , Rfl:    metoprolol succinate (TOPROL-XL) 50 MG 24 hr tablet, Take 1 tablet (50 mg total) by mouth daily. Take with or immediately following a meal., Disp: 90 tablet, Rfl: 3   PROAIR HFA 108 (90 Base) MCG/ACT inhaler, Inhale 2 puffs into the lungs 4 (four) times daily as needed for shortness of breath or wheezing., Disp: , Rfl: 2   triamterene-hydrochlorothiazide (MAXZIDE-25) 37.5-25 MG tablet, Take 1 tablet by mouth daily., Disp: 90 tablet, Rfl: 1   Vitamin D, Ergocalciferol, (DRISDOL) 50000 units CAPS capsule, Take 50,000 Units by mouth every Wednesday., Disp: , Rfl:    XARELTO 20 MG TABS tablet, Take 1 tablet (20 mg total) by mouth daily., Disp: 90 tablet, Rfl: 1  No Known Allergies Review of Systems Objective:   Vitals:   01/26/19 0925  BP: (!) 127/53  Pulse: 66  Resp: 16    General: Well developed, nourished, in no acute distress, alert and oriented x3   Dermatological: Skin is warm, dry and supple bilateral. Nails x 10 are well maintained; remaining integument appears unremarkable at this time. There are no open sores, no preulcerative lesions, no rash or signs of infection present.  Significant thickening and pain porokeratotic lesion lateral aspect fifth digit right foot.  Vascular: Dorsalis  Pedis artery and Posterior Tibial artery pedal pulses are 2/4 bilateral with immedate capillary fill time. Pedal hair growth present. No varicosities and no lower extremity edema present bilateral.   Neruologic: Grossly intact via light touch bilateral. Vibratory intact via tuning fork bilateral. Protective threshold with Semmes Wienstein monofilament intact to all pedal sites bilateral. Patellar and Achilles deep tendon reflexes 2+ bilateral. No Babinski or clonus noted bilateral.   Musculoskeletal: No gross boney pedal deformities bilateral. No pain, crepitus, or limitation noted with foot and ankle range of motion bilateral. Muscular strength 5/5 in all groups tested bilateral.  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    Radiographs:  Discussed etiology pathology conservative versus surgical therapies.  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  Assessment & Plan:   Assessment: Hammertoe deformity fifth right exostosis lateral aspect fifth right  Plan: Discussed etiology pathology conservative surgical therapies at this point time went ahead and consented her for a derotational arthroplasty fifth toe right foot as well as a exostectomy fifth digit right foot.  Answered all the questions that she and her daughter had to the best my ability layman's terms she understood and was amenable to it signed Arnell Sieving page with consent form.  I will follow-up with her in the near future for surgical intervention.  She is provided with both oral and written home-going instructions as well as information regarding the surgery center and anesthesia.     Easter Kennebrew T. [place], [place]

## 2019-02-01 ENCOUNTER — Telehealth: Payer: Self-pay | Admitting: *Deleted

## 2019-02-01 NOTE — Telephone Encounter (Signed)
"  I'm calling in regards to [facility].  I'm calling in regards to her surgery that needs to be scheduled."

## 2019-02-03 ENCOUNTER — Other Ambulatory Visit: Payer: Self-pay

## 2019-02-03 MED ORDER — XARELTO 20 MG PO TABS: 20.0000 mg | ORAL_TABLET | Freq: Every day | ORAL | 1 refills | Status: DC

## 2019-02-03 NOTE — Telephone Encounter (Signed)
"  I'm calling to see if you have scheduled surgery for  [facility]."  We have not at this time.  We're trying to get medical clearance from her doctor.  Once we get it I will give you a call to schedule the surgery.  "Okay, I was just following up.  Thanks for the information."

## 2019-02-05 ENCOUNTER — Encounter: Payer: Self-pay | Admitting: *Deleted

## 2019-02-05 NOTE — Progress Notes (Signed)
Per Dr. Milinda Pointer, I sent a medical clearance request letter to Dr. Einar Gip.  Surgery has not been scheduled at this time.  The medical clearance will be the determining factor.

## 2019-02-09 ENCOUNTER — Encounter: Payer: Self-pay | Admitting: Cardiology

## 2019-02-23 ENCOUNTER — Telehealth: Payer: Self-pay | Admitting: Podiatry

## 2019-02-23 NOTE — Telephone Encounter (Signed)
Called pt to schedule her for surgery with Dr. Milinda Pointer. I told her that we had received medical clearance from her cardiologist.

## 2019-03-19 ENCOUNTER — Telehealth: Payer: Self-pay | Admitting: Podiatry

## 2019-03-19 NOTE — Telephone Encounter (Signed)
Pt's daughter called wanting to know when pt should stop taking blood thinner before surgery on 03/26/2019.

## 2019-03-22 NOTE — Telephone Encounter (Signed)
Left voicemail for daughter letting her know mom's cardiologist stated in his medical clearance in September that pt needs to stop the Xarelto 48 hours before surgery and can restart it the same day or the next day if possible. Told her to call with any other questions.

## 2019-03-25 ENCOUNTER — Other Ambulatory Visit: Payer: Self-pay | Admitting: Podiatry

## 2019-03-25 MED ORDER — OXYCODONE-ACETAMINOPHEN 10-325 MG PO TABS: 1.0000 | ORAL_TABLET | ORAL | 0 refills | Status: DC | PRN

## 2019-03-25 MED ORDER — ONDANSETRON HCL 4 MG PO TABS: 4.0000 mg | ORAL_TABLET | Freq: Three times a day (TID) | ORAL | 0 refills | Status: DC | PRN

## 2019-03-25 MED ORDER — CEPHALEXIN 500 MG PO CAPS: 500.0000 mg | ORAL_CAPSULE | Freq: Three times a day (TID) | ORAL | 0 refills | Status: DC

## 2019-03-26 ENCOUNTER — Encounter: Payer: Self-pay | Admitting: Podiatry

## 2019-03-26 DIAGNOSIS — M2041 Other hammer toe(s) (acquired), right foot: Secondary | ICD-10-CM

## 2019-03-26 DIAGNOSIS — D492 Neoplasm of unspecified behavior of bone, soft tissue, and skin: Secondary | ICD-10-CM

## 2019-04-01 ENCOUNTER — Ambulatory Visit (INDEPENDENT_AMBULATORY_CARE_PROVIDER_SITE_OTHER): Payer: Medicaid Other

## 2019-04-01 ENCOUNTER — Encounter: Payer: Self-pay | Admitting: Podiatry

## 2019-04-01 ENCOUNTER — Ambulatory Visit (INDEPENDENT_AMBULATORY_CARE_PROVIDER_SITE_OTHER): Payer: Medicaid Other | Admitting: Podiatry

## 2019-04-01 ENCOUNTER — Other Ambulatory Visit: Payer: Self-pay

## 2019-04-01 DIAGNOSIS — M898X7 Other specified disorders of bone, ankle and foot: Secondary | ICD-10-CM

## 2019-04-01 DIAGNOSIS — M2041 Other hammer toe(s) (acquired), right foot: Secondary | ICD-10-CM

## 2019-04-01 DIAGNOSIS — Z9889 Other specified postprocedural states: Secondary | ICD-10-CM

## 2019-04-03 NOTE — Progress Notes (Signed)
She presents today date of surgery 03/26/2019 status post derotational arthroplasty fifth digit right foot with exostectomy fifth digit right foot.  She denies fever chills nausea vomit states that is doing just fine.  Objective: Vital signs are stable she is alert and oriented x3.  There is no erythema edema cellulitis drainage or odor.  Toe is in good position.  Sutures are intact margins well coapted.  Assessment: Well-healing surgical toe fifth right.  Plan: Follow-up with her in 1 week redressed the toe.  Continue current therapies.

## 2019-04-13 ENCOUNTER — Other Ambulatory Visit: Payer: Self-pay

## 2019-04-13 ENCOUNTER — Encounter: Payer: Self-pay | Admitting: Podiatry

## 2019-04-13 ENCOUNTER — Ambulatory Visit (INDEPENDENT_AMBULATORY_CARE_PROVIDER_SITE_OTHER): Payer: Medicaid Other | Admitting: Podiatry

## 2019-04-13 DIAGNOSIS — Z9889 Other specified postprocedural states: Secondary | ICD-10-CM

## 2019-04-13 NOTE — Progress Notes (Signed)
She presents today date of surgery 03/26/2019 status post hammertoe repair fifth right exostectomy fifth right denies fever chills nausea vomit muscle aches pain states that she still has some pain to the fifth toe and that she will be happy when she is out of her surgical shoe.  Objective: Postop dressing intact was removed demonstrates sutures are intact margins are well coapted sutures were removed today no dehiscence no open wound lesions bleeding or pain.  No purulence no malodor.  Assessment: Hammertoe repair fifth right 03/26/2019.  Plan: We will allow her to start soaking the toe and get back into regular shoe gear as soon as she feels like it.  I would like to follow-up with her in about a month.

## 2019-04-27 ENCOUNTER — Other Ambulatory Visit: Payer: Self-pay

## 2019-04-27 ENCOUNTER — Ambulatory Visit: Payer: Medicaid Other

## 2019-04-27 ENCOUNTER — Ambulatory Visit (INDEPENDENT_AMBULATORY_CARE_PROVIDER_SITE_OTHER): Payer: Medicaid Other | Admitting: Podiatry

## 2019-04-27 ENCOUNTER — Encounter: Payer: Self-pay | Admitting: Podiatry

## 2019-04-27 DIAGNOSIS — Z9889 Other specified postprocedural states: Secondary | ICD-10-CM

## 2019-04-27 DIAGNOSIS — M898X7 Other specified disorders of bone, ankle and foot: Secondary | ICD-10-CM

## 2019-04-27 DIAGNOSIS — M2041 Other hammer toe(s) (acquired), right foot: Secondary | ICD-10-CM

## 2019-04-28 NOTE — Progress Notes (Signed)
She presents today for follow-up of her fifth toe arthroplasty.  Date of surgery 03/26/2019.  She states that is a little painful at times otherwise is doing pretty good.  Objective: No erythema mild edema no cellulitis drainage or odor I removed any of the reactive dead skin that was present today.  Assessment: Well-healing surgical toe right.  Plan: Follow-up with me as needed.

## 2019-05-10 ENCOUNTER — Other Ambulatory Visit: Payer: Self-pay | Admitting: Cardiology

## 2019-05-11 ENCOUNTER — Other Ambulatory Visit: Payer: Self-pay

## 2019-05-11 ENCOUNTER — Ambulatory Visit (INDEPENDENT_AMBULATORY_CARE_PROVIDER_SITE_OTHER): Payer: Medicaid Other | Admitting: Podiatry

## 2019-05-11 ENCOUNTER — Encounter: Payer: Self-pay | Admitting: Podiatry

## 2019-05-11 ENCOUNTER — Ambulatory Visit: Payer: Medicaid Other

## 2019-05-11 DIAGNOSIS — M2041 Other hammer toe(s) (acquired), right foot: Secondary | ICD-10-CM

## 2019-05-11 DIAGNOSIS — M898X7 Other specified disorders of bone, ankle and foot: Secondary | ICD-10-CM

## 2019-05-11 DIAGNOSIS — Z9889 Other specified postprocedural states: Secondary | ICD-10-CM

## 2019-05-11 NOTE — Progress Notes (Signed)
She presents today for postop visit date of surgery 03/26/2019 hammertoe repair fifth right with exostectomy fifth right.  States that is doing just fine.  Objective: Vital signs are stable alert and oriented x3 mild reactive hyperkeratotic tissue overlying the incision site which I debrided thoroughly for her today.  Demonstrates no open lesions or wounds no dehiscence of the wound no signs of infection.  Assessment: Well-healing surgical toe fifth right.  Mild maceration fourth interdigital space.  Plan: Debrided the area today told her to keep some alcohol between the toes and make sure she dries thoroughly.  She should be fine she will contact us with any questions or concerns.

## 2019-06-01 ENCOUNTER — Encounter: Payer: Self-pay | Admitting: Cardiology

## 2019-06-01 NOTE — Progress Notes (Signed)
Letter created for patient to have COVID vaccination. No contraindication from cardiac standpoint

## 2019-06-07 ENCOUNTER — Ambulatory Visit: Payer: Medicaid Other | Admitting: Cardiology

## 2019-06-15 ENCOUNTER — Other Ambulatory Visit: Payer: Self-pay | Admitting: Internal Medicine

## 2019-06-15 DIAGNOSIS — Z1231 Encounter for screening mammogram for malignant neoplasm of breast: Secondary | ICD-10-CM

## 2019-06-25 ENCOUNTER — Ambulatory Visit: Payer: Medicaid Other | Admitting: Cardiology

## 2019-06-28 ENCOUNTER — Other Ambulatory Visit: Payer: Self-pay | Admitting: Cardiology

## 2019-07-02 ENCOUNTER — Encounter: Payer: Self-pay | Admitting: Cardiology

## 2019-07-02 ENCOUNTER — Ambulatory Visit: Payer: Medicaid Other | Admitting: Cardiology

## 2019-07-02 ENCOUNTER — Other Ambulatory Visit: Payer: Self-pay

## 2019-07-02 VITALS — BP 145/89 | HR 70 | Temp 98.4°F | Ht 64.0 in | Wt 178.5 lb

## 2019-07-02 DIAGNOSIS — I4891 Unspecified atrial fibrillation: Secondary | ICD-10-CM

## 2019-07-02 DIAGNOSIS — R0609 Other forms of dyspnea: Secondary | ICD-10-CM

## 2019-07-02 DIAGNOSIS — I10 Essential (primary) hypertension: Secondary | ICD-10-CM

## 2019-07-02 DIAGNOSIS — Z8774 Personal history of (corrected) congenital malformations of heart and circulatory system: Secondary | ICD-10-CM

## 2019-07-02 DIAGNOSIS — D61818 Other pancytopenia: Secondary | ICD-10-CM

## 2019-07-02 NOTE — Progress Notes (Signed)
Primary Physician/Referring:  Nolene Ebbs, MD  Patient ID: Morgan Bowers, female    DOB: 19-Nov-1945, 74 y.o.   MRN: [id number]  Chief Complaint  Patient presents with  . PAF  . MR  . Follow-up    6 month   HPI:    Morgan Bowers  is a 74 y.o. with paroxysmal atrial fibrillation with history of cardioversion in 2018 maintaining sinus rhythm since, history of PFO closure in 2018, hypertension, hyperlipidemia, and pancytopenia.  She is here on a 74-month office visit.  She is complaining dyspnea on exertion activities.  Has chronic exertional dyspnea, and feels that this is worsened over the last few months. States that she has some "huffing and puffing" with moving around her kitchen. Also has some mild chest discomfort, but her biggest complaint is the dyspnea.  She does admit that her inhaler does seem to help.  She has not had any significant changes in her weight. No orthopnea, PND. No complaints of palpitation, sudden heart racing or irregular heartbeat. She denies any dizziness, near-syncope or syncope. No symptoms of TIA. She has mild chronic swelling on the legs that is unchanged, uses support stockings.  No history of hematuria, GI bleed or bleeding from any other site. No history of excessive bruising.  No tobacco use. She walks for about 30 minutes daily.  She has h/o pancytopenia. EGD was negative in April 2019. She had a colon polyp removed in April 2019.  Past Medical History:  Diagnosis Date  . A-fib ([place])   . Anemia   . Arthritis    "knees" (09/03/2016)  . GERD (gastroesophageal reflux disease)   . High cholesterol   . Hypertension    Past Surgical History:  Procedure Laterality Date  . CARDIOVERSION N/A 08/06/2016   Procedure: CARDIOVERSION;  Surgeon: [name], MD;  Location: [facility];  Service: Cardiovascular;  Laterality: N/A;  . COLONOSCOPY WITH PROPOFOL N/A 08/13/2017   Procedure: COLONOSCOPY WITH PROPOFOL;  Surgeon: Laurence Spates,  MD;  Location: [facility];  Service: Endoscopy;  Laterality: N/A;  . ESOPHAGOGASTRODUODENOSCOPY (EGD) WITH PROPOFOL N/A 08/13/2017   Procedure: ESOPHAGOGASTRODUODENOSCOPY (EGD) WITH PROPOFOL;  Surgeon: Laurence Spates, MD;  Location: [facility];  Service: Endoscopy;  Laterality: N/A;  . IR GENERIC HISTORICAL  06/19/2016   IR VERTEBROPLASTY CERV/THOR BX INC UNI/BIL INC/INJECT/IMAGING 06/19/2016 Luanne Bras, MD [facility]  . IR GENERIC HISTORICAL  06/19/2016   IR VERTEBROPLASTY EA ADDL (T&LS) BX INC UNI/BIL INC INJECT/IMAGING 06/19/2016 Luanne Bras, MD [facility]  . PATENT FORAMEN OVALE CLOSURE  09/03/2016  . PATENT FORAMEN OVALE(PFO) CLOSURE N/A 09/03/2016   Procedure: Patent Forament [place](PFO) Closure;  Surgeon: [name], MD;  Location: [facility];  Service: Cardiovascular;  Laterality: N/A;  . TEE WITHOUT CARDIOVERSION N/A 02/29/2016   Procedure: TRANSESOPHAGEAL ECHOCARDIOGRAM (TEE);  Surgeon: [name], MD;  Location: [facility];  Service: Cardiovascular;  Laterality: N/A;  . TOE SURGERY Right 2020   Social History   Tobacco Use  . Smoking status: Never Smoker  . Smokeless tobacco: Never Used  Substance Use Topics  . Alcohol use: No    ROS  Review of Systems  Constitution: Negative for malaise/fatigue, weight gain and weight loss.  Eyes: Negative for blurred vision.  Cardiovascular: Positive for dyspnea on exertion and leg swelling (mild, chronic). Negative for chest pain, near-syncope, palpitations and syncope.  Respiratory: Negative for cough.   Hematologic/Lymphatic: Negative for bleeding problem.   Objective  Blood pressure (!) 145/89, pulse 70,  temperature 98.4 F (36.9 C), height 5\' 4"  (1.626 m), weight 178 lb 8 oz (81 kg), SpO2 98 %.  Vitals with BMI 07/02/2019 01/26/2019 11/30/2018  Height 5\' 4"  - 5\' 4"   Weight 178 lbs 8 oz - 174 lbs  BMI 123456 - [id number]  Systolic [id number]  Diastolic 89 53 71  Pulse 70 66 72    Physical Exam  Constitutional:  She is oriented to person, place, and time. Vital signs are normal. She appears well-developed and well-nourished.  HENT:  Head: Normocephalic and atraumatic.  Cardiovascular: Normal rate, regular rhythm and intact distal pulses.  Murmur heard.  Midsystolic murmur is present with a grade of 1/6 at the apex. Pulses:      Dorsalis pedis pulses are 2+ on the right side and 2+ on the left side.       Posterior tibial pulses are 2+ on the right side and 2+ on the left side.  Varicosities bilateral lower extremities  Pulmonary/Chest: Effort normal and breath sounds normal. No accessory muscle usage. No respiratory distress.  Abdominal: Soft. Bowel sounds are normal.  Musculoskeletal:        General: Normal range of motion.     Cervical back: Normal range of motion.  Neurological: She is alert and oriented to person, place, and time.  Skin: Skin is warm and dry.  Vitals reviewed.  Laboratory examination:   No results for input(s): NA, K, CL, CO2, GLUCOSE, BUN, CREATININE, CALCIUM, GFRNONAA, GFRAA in the last 8760 hours. CrCl cannot be calculated (Patient's most recent lab result is older than the maximum 21 days allowed.).  CMP Latest Ref Rng & Units 06/27/2018 07/08/2016 06/18/2016  Glucose 70 - 99 mg/dL 92 86 100(H)  BUN 8 - 23 mg/dL 15 10 7   Creatinine 0.44 - 1.00 mg/dL 1.23(H) 0.71 0.70  Sodium 135 - 145 mmol/L 136 139 137  Potassium 3.5 - 5.1 mmol/L 3.6 4.5 4.1  Chloride 98 - 111 mmol/L 96(L) 101 99(L)  CO2 22 - 32 mmol/L 31 31 30   Calcium 8.9 - 10.3 mg/dL 9.5 9.8 9.5  Total Protein 6.5 - 8.1 g/dL 7.3 - -  Total Bilirubin 0.3 - 1.2 mg/dL 0.7 - -  Alkaline Phos 38 - 126 U/L 32(L) - -  AST 15 - 41 U/L 21 - -  ALT 0 - 44 U/L 13 - -   CBC Latest Ref Rng & Units 06/27/2018 07/08/2016 06/18/2016  WBC 4.0 - 10.5 K/uL 4.7 3.0(L) 3.7(L)  Hemoglobin 12.0 - 15.0 g/dL 10.3(L) 10.8(L) 11.0(L)  Hematocrit 36.0 - 46.0 % 31.8(L) 33.2(L) 34.7(L)  Platelets 150 - 400 K/uL 133(L) 157 151   Lipid Panel      Component Value Date/Time   CHOL 191 10/25/2010 0538   TRIG 27 10/25/2010 0538   HDL 90 10/25/2010 0538   CHOLHDL 2.1 10/25/2010 0538   VLDL 5 10/25/2010 0538   LDLCALC 96 10/25/2010 0538   HEMOGLOBIN A1C No results found for: HGBA1C, MPG TSH No results for input(s): TSH in the last 8760 hours.  External labs   Medications and allergies  No Known Allergies   Current Outpatient Medications  Medication Instructions  . acetaminophen (TYLENOL) 650 mg, Oral, Daily PRN  . atorvastatin (LIPITOR) 20 MG tablet TAKE 1 TABLET BY MOUTH EVERY DAY  . ferrous sulfate 325 mg, Oral, Daily  . ipratropium-albuterol (DUONEB) 0.5-2.5 (3) MG/3ML SOLN 3 mLs, Nebulization, Every 6 hours PRN  . lisinopril (ZESTRIL) 20 mg, Oral, As directed  .  losartan (COZAAR) 25 MG tablet TAKE 1 TABLET EVERY DAY FOR BLOOD PRESSURE  . metoprolol succinate (TOPROL-XL) 50 mg, Oral, Daily, Take with or immediately following a meal.   . ondansetron (ZOFRAN) 4 mg, Oral, Every 8 hours PRN  . PROAIR HFA 108 (90 Base) MCG/ACT inhaler 2 puffs, Inhalation, 4 times daily PRN  . RESTASIS 0.05 % ophthalmic emulsion 1 drop, 2 times daily  . triamterene-hydrochlorothiazide (MAXZIDE-25) 37.5-25 MG tablet TAKE 1 TABLET BY MOUTH EVERY DAY  . Vitamin D (Ergocalciferol) (DRISDOL) 50,000 Units, Oral, Every Wed  . Xarelto 20 mg, Oral, Daily    Radiology:  No results found.  Cardiac Studies:   Echocardiogram 11/18/2018: Normal LV systolic function with EF 62%. Left ventricle cavity is normal in size. Normal left ventricular wall thickness. Normal global wall motion. Doppler evidence of grade II (pseudonormal) diastolic dysfunction, elevated LAP. Calculated EF 62%. Trace aortic regurgitation. Moderate (Grade III) mitral regurgitation. Mild to moderate tricuspid regurgitation. Estimated pulmonary artery systolic pressure is 37 mmHg.  Compared to previous study in 2018, filling pressures are increased. Mild PH is new.   PFO  closure 09/03/2016: Interventional data: Successful closure of the atrial septal defect under intracardiac echo guidance with implantation of a 25 millimeter septal occluder. Post-deployment double contrast study revealed no residual shunting.  Lexiscan Thalium stress 03/20/2017: 1. Resting EKG: NSR. Non diagnostic stress EKG due to pharmacologic stress. 2. Normal SPECT study with normal left ventricular systolic function. Low risk study  Assessment     ICD-10-CM   1. Dyspnea on exertion  R06.00 PCV ECHOCARDIOGRAM COMPLETE  2. Atrial fibrillation with controlled ventricular response (HCC) CHA2DS2-VASc score- 3 with yearly risk of stroke-3.2%  I48.91 EKG 12-Lead    PCV ECHOCARDIOGRAM COMPLETE  3. Status post patent foramen ovale closure  Z87.74   4. Essential hypertension  I10   5. Pancytopenia ([place])  D61.818     EKG 07/02/2019: Atrial fibrillation with controlled ventricular response at 73 bpm, normal axis, nonspecific T wave abnormality.   No orders of the defined types were placed in this encounter.   Medications Discontinued During This Encounter  Medication Reason  . oxyCODONE-acetaminophen (PERCOCET) 10-325 MG tablet Error     Recommendations:   Morgan Bowers  is a 74 y.o. with paroxysmal atrial fibrillation with history of cardioversion in 2018 maintaining sinus rhythm since, history of PFO closure in 2018, hypertension, hyperlipidemia, and pancytopenia.  Patient is here for 72-month follow-up visit.  She is complaining of worsening dyspnea on exertion over the last few months with minimal activities.  Does mention some chest discomfort on occasion, but her biggest complaint is the dyspnea.  No clinical evidence of decompensated heart failure.  She has had negative nuclear stress test in 2018.  She is noted to be in atrial fibrillation that is rate controlled by EKG today.  I recommended obtaining echocardiogram for further evaluation given her worsening dyspnea.  May need to  consider cardioversion for atrial fibrillation as this may be potentially contributing to her dyspnea.  I do suspect that her dyspnea may be related to asthma as she does have some improvement with nebulizer use.  Her blood pressure is slightly elevated today, but is generally well controlled.  Will reevaluate at her next office visit and make further changes if needed at that time.  She has been on anticoagulation since 2018 and originally found to have A. fib, no bleeding diathesis reported.  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 I do  not have recent labs from PCP office, will request for records.  I will see her back in 2 to 3 weeks for close follow-up after her echocardiogram.    Miquel Dunn, MSN, APRN, FNP-C [facility]. [place] Office: [phone] Fax: [phone]

## 2019-07-06 NOTE — Progress Notes (Signed)
Labs 11/25/18: Total cholesterol 145, triglycerides 48, HDL 77, LDL 54.  BUN 20, creatinine 1.04, EGFR 62 mL.  CMP otherwise normal.  Hb 10.5/HCT 32.4, platelets 117.

## 2019-07-14 ENCOUNTER — Other Ambulatory Visit: Payer: Medicaid Other

## 2019-07-15 ENCOUNTER — Other Ambulatory Visit: Payer: Self-pay

## 2019-07-15 ENCOUNTER — Ambulatory Visit: Payer: Self-pay

## 2019-07-15 DIAGNOSIS — I4891 Unspecified atrial fibrillation: Secondary | ICD-10-CM

## 2019-07-15 DIAGNOSIS — R0609 Other forms of dyspnea: Secondary | ICD-10-CM

## 2019-07-22 NOTE — H&P (View-Only) (Signed)
Primary Physician/Referring:  Nolene Ebbs, MD  Patient ID: Morgan Bowers, female    DOB: 1946/05/10, 74 y.o.   MRN: [id number]  No chief complaint on file.  HPI:    Morgan Bowers  is a 74 y.o. with h/o pancytopenia, negative GI work up in April 2019,  paroxysmal atrial fibrillation with history of cardioversion in 2018 maintaining sinus rhythm since, history of PFO closure in 2018, hypertension, hyperlipidemia, and pancytopenia.  She was noted to be in atrial fibrillation on 07/02/2019.  Echocardiogram obtained she now presents for follow-up of atrial fibrillation and also recent worsening dyspnea, chest pain.  No history of hematuria, GI bleed or bleeding from any other site. No history of excessive bruising. No tobacco use. She walks for about 30 minutes daily.  Symptoms of dyspnea have worsened over the past few weeks, she is extremely concerned about this.  Past Medical History:  Diagnosis Date  . A-fib ([place])   . Anemia   . Arthritis    "knees" (09/03/2016)  . GERD (gastroesophageal reflux disease)   . High cholesterol   . Hypertension    Past Surgical History:  Procedure Laterality Date  . CARDIOVERSION N/A 08/06/2016   Procedure: CARDIOVERSION;  Surgeon: [name], MD;  Location: [facility];  Service: Cardiovascular;  Laterality: N/A;  . COLONOSCOPY WITH PROPOFOL N/A 08/13/2017   Procedure: COLONOSCOPY WITH PROPOFOL;  Surgeon: Laurence Spates, MD;  Location: [facility];  Service: Endoscopy;  Laterality: N/A;  . ESOPHAGOGASTRODUODENOSCOPY (EGD) WITH PROPOFOL N/A 08/13/2017   Procedure: ESOPHAGOGASTRODUODENOSCOPY (EGD) WITH PROPOFOL;  Surgeon: Laurence Spates, MD;  Location: [facility];  Service: Endoscopy;  Laterality: N/A;  . IR GENERIC HISTORICAL  06/19/2016   IR VERTEBROPLASTY CERV/THOR BX INC UNI/BIL INC/INJECT/IMAGING 06/19/2016 Luanne Bras, MD [facility]  . IR GENERIC HISTORICAL  06/19/2016   IR VERTEBROPLASTY EA ADDL (T&LS) BX INC UNI/BIL INC  INJECT/IMAGING 06/19/2016 Luanne Bras, MD [facility]  . PATENT FORAMEN OVALE CLOSURE  09/03/2016  . PATENT FORAMEN OVALE(PFO) CLOSURE N/A 09/03/2016   Procedure: Patent Forament [place](PFO) Closure;  Surgeon: [name], MD;  Location: [facility];  Service: Cardiovascular;  Laterality: N/A;  . TEE WITHOUT CARDIOVERSION N/A 02/29/2016   Procedure: TRANSESOPHAGEAL ECHOCARDIOGRAM (TEE);  Surgeon: [name], MD;  Location: [facility];  Service: Cardiovascular;  Laterality: N/A;  . TOE SURGERY Right 2020   Social History   Tobacco Use  . Smoking status: Never Smoker  . Smokeless tobacco: Never Used  Substance Use Topics  . Alcohol use: No    ROS  Review of Systems  Cardiovascular: Positive for dyspnea on exertion and leg swelling (mild and chronic). Negative for chest pain.  Gastrointestinal: Negative for melena.   Objective  There were no vitals taken for this visit.  Vitals with BMI 07/02/2019 01/26/2019 11/30/2018  Height 5' 4"  - 5' 4"   Weight 178 lbs 8 oz - 174 lbs  BMI 19.37 - 90.24  Systolic 097 353 299  Diastolic 89 53 71  Pulse 70 66 72    Physical Exam  Constitutional: Vital signs are normal. She appears well-developed and well-nourished.  Cardiovascular: Normal rate, regular rhythm and intact distal pulses.  Murmur heard.  Midsystolic murmur is present with a grade of 1/6 at the apex. Pulses:      Dorsalis pedis pulses are 2+ on the right side and 2+ on the left side.       Posterior tibial pulses are 2+ on the right side and 2+ on  the left side.  Varicosities bilateral lower extremities  Pulmonary/Chest: Effort normal and breath sounds normal. No accessory muscle usage. No respiratory distress.  Abdominal: Soft. Bowel sounds are normal.  Vitals reviewed.  Laboratory examination:   No results for input(s): NA, K, CL, CO2, GLUCOSE, BUN, CREATININE, CALCIUM, GFRNONAA, GFRAA in the last 8760 hours. CrCl cannot be calculated (Patient's most recent lab  result is older than the maximum 21 days allowed.).  CMP Latest Ref Rng & Units 06/27/2018 07/08/2016 06/18/2016  Glucose 70 - 99 mg/dL 92 86 100(H)  BUN 8 - 23 mg/dL 15 10 7   Creatinine 0.44 - 1.00 mg/dL 1.23(H) 0.71 0.70  Sodium 135 - 145 mmol/L 136 139 137  Potassium 3.5 - 5.1 mmol/L 3.6 4.5 4.1  Chloride 98 - 111 mmol/L 96(L) 101 99(L)  CO2 22 - 32 mmol/L 31 31 30   Calcium 8.9 - 10.3 mg/dL 9.5 9.8 9.5  Total Protein 6.5 - 8.1 g/dL 7.3 - -  Total Bilirubin 0.3 - 1.2 mg/dL 0.7 - -  Alkaline Phos 38 - 126 U/L 32(L) - -  AST 15 - 41 U/L 21 - -  ALT 0 - 44 U/L 13 - -   CBC Latest Ref Rng & Units 06/27/2018 07/08/2016 06/18/2016  WBC 4.0 - 10.5 K/uL 4.7 3.0(L) 3.7(L)  Hemoglobin 12.0 - 15.0 g/dL 10.3(L) 10.8(L) 11.0(L)  Hematocrit 36.0 - 46.0 % 31.8(L) 33.2(L) 34.7(L)  Platelets 150 - 400 K/uL 133(L) 157 151   Lipid Panel     Component Value Date/Time   CHOL 191 10/25/2010 0538   TRIG 27 10/25/2010 0538   HDL 90 10/25/2010 0538   CHOLHDL 2.1 10/25/2010 0538   VLDL 5 10/25/2010 0538   LDLCALC 96 10/25/2010 0538   HEMOGLOBIN A1C No results found for: HGBA1C, MPG TSH No results for input(s): TSH in the last 8760 hours.  External labs   Hemoglobin 10.300 06/27/2018; INR 1.740 06/27/2018; Platelets 133.000 06/27/2018  Creatinine, Serum 1.230 06/27/2018 Potassium 3.600 06/27/2018 ALT (SGPT) 13.000 06/27/2018  Labs 11/25/18: Total cholesterol 145, triglycerides 48, HDL 77, LDL 54.  BUN 20, creatinine 1.04, EGFR 62 mL.  CMP otherwise normal.  Hb 10.5/HCT 32.4, platelets 117.   Medications and allergies  No Known Allergies   Current Outpatient Medications  Medication Instructions  . acetaminophen (TYLENOL) 650 mg, Oral, Daily PRN  . atorvastatin (LIPITOR) 20 MG tablet TAKE 1 TABLET BY MOUTH EVERY DAY  . ferrous sulfate 325 mg, Oral, Daily  . ipratropium-albuterol (DUONEB) 0.5-2.5 (3) MG/3ML SOLN 3 mLs, Nebulization, Every 6 hours PRN  . lisinopril (ZESTRIL) 20 mg, Oral, As directed    . losartan (COZAAR) 25 MG tablet TAKE 1 TABLET EVERY DAY FOR BLOOD PRESSURE  . metoprolol succinate (TOPROL-XL) 50 mg, Oral, Daily, Take with or immediately following a meal.   . ondansetron (ZOFRAN) 4 mg, Oral, Every 8 hours PRN  . PROAIR HFA 108 (90 Base) MCG/ACT inhaler 2 puffs, Inhalation, 4 times daily PRN  . RESTASIS 0.05 % ophthalmic emulsion 1 drop, 2 times daily  . triamterene-hydrochlorothiazide (MAXZIDE-25) 37.5-25 MG tablet TAKE 1 TABLET BY MOUTH EVERY DAY  . Vitamin D (Ergocalciferol) (DRISDOL) 50,000 Units, Oral, Every Wed  . Xarelto 20 mg, Oral, Daily    Radiology:  No results found.  Cardiac Studies:   PFO closure 09/03/2016: Interventional data: Successful closure of the atrial septal defect under intracardiac echo guidance with implantation of a 25 millimeter septal occluder. Post-deployment double contrast study revealed no residual shunting.  Lexiscan  Thalium stress 03/20/2017:  1. Resting EKG: NSR. Non diagnostic stress EKG due to pharmacologic stress.  2. Normal SPECT study with normal left ventricular systolic function. Low risk study.  Echocardiogram 07/15/2019:  Normal LV systolic function with visual EF 55-60%. No obvious regional  wall motion abnormalities. Left ventricle cavity is normal in size. Normal  wall thickness. Calculated EF 60%.  Left atrial cavity is mildly dilated. Interatrial septal occluder is in  good position without obvious residual shunting or thrombus.  Visually, right atrial cavity appears mildly dilated.  Visually, right ventricle cavity appears mildly dilated. Grossly normal  right ventricular function.  Mild (Grade I) aortic regurgitation.  Moderate (Grade II) mitral regurgitation.  Mild to moderate tricuspid regurgitation.  Mild pulmonic regurgitation.  IVC is dilated with a respiratory response of >50%.  Prior study 11/18/2018: LVEF 80%, Grade 2 diastolic dysfunction, elevated  LAP, no significant change in the valvular heart  disease.  EKG: EKG 07/02/2019: Atrial fibrillation with controlled ventricular response at 73 bpm, normal axis, nonspecific T wave abnormality.   Assessment     ICD-10-CM   1. Atrial fibrillation with controlled ventricular response (HCC) CHA2DS2-VASc score- 3 with yearly risk of stroke-3.2%  I48.91   2. Dyspnea on exertion  R06.00   3. Status post patent foramen ovale closure  Z87.74   4. Essential hypertension  I10     No orders of the defined types were placed in this encounter.   There are no discontinued medications.   Recommendations:   Morgan Bowers  is a 74 y.o. with paroxysmal atrial fibrillation with history of cardioversion in 2018 maintaining sinus rhythm since, history of PFO closure in 2018, hypertension, hyperlipidemia, and pancytopenia.  She was noted to be in atrial fibrillation on 07/02/2019.  Echocardiogram obtained she now presents for follow-up of atrial fibrillation and also recent worsening dyspnea.  I reviewed the results of the echocardiogram.  Due to persistent symptoms of dyspnea, I have recommended proceeding with direct-current cardioversion.  She has been on long-term anticoagulation with Xarelto and although has pancytopenia, CBC has remained stable over the years.  Heart rate is controlled, hence no changes in the medications were done today.  I will see her back after the direct-current cardioversion.  Schedule for Direct current cardioversion. I have discussed regarding risks benefits rate control vs rhythm control with the patient. Patient understands cardiac arrest and need for CPR, aspiration pneumonia, but not limited to these. Patient is willing.   Her blood pressure is slightly elevated today, I would like to increase her losartan, she is on triamterene and also potassium supplements, I would like to obtain a BMP and make these changes on her next office visit.  She also has chronic anemia, I will obtain anemia panel with a lab draw.  [name], MD, [facility] 07/22/2019, 10:19 PM [facility]. [place] Office: [phone]

## 2019-07-22 NOTE — Progress Notes (Signed)
Primary Physician/Referring:  Nolene Ebbs, MD  Patient ID: Morgan Bowers, female    DOB: [birth year], 74 y.o.   MRN: [id number]  No chief complaint on file.  HPI:    Morgan Bowers  is a 74 y.o. with h/o pancytopenia, negative GI work up in April 2019,  paroxysmal atrial fibrillation with history of cardioversion in 2018 maintaining sinus rhythm since, history of PFO closure in 2018, hypertension, hyperlipidemia, and pancytopenia.  She was noted to be in atrial fibrillation on 07/02/2019.  Echocardiogram obtained she now presents for follow-up of atrial fibrillation and also recent worsening dyspnea, chest pain.  No history of hematuria, GI bleed or bleeding from any other site. No history of excessive bruising. No tobacco use. She walks for about 30 minutes daily.  Symptoms of dyspnea have worsened over the past few weeks, she is extremely concerned about this.  Past Medical History:  Diagnosis Date  . A-fib ([place])   . Anemia   . Arthritis    "knees" (09/03/2016)  . GERD (gastroesophageal reflux disease)   . High cholesterol   . Hypertension    Past Surgical History:  Procedure Laterality Date  . CARDIOVERSION N/A 08/06/2016   Procedure: CARDIOVERSION;  Surgeon: [name], MD;  Location: [place];  Service: Cardiovascular;  Laterality: N/A;  . COLONOSCOPY WITH PROPOFOL N/A 08/13/2017   Procedure: COLONOSCOPY WITH PROPOFOL;  Surgeon: Laurence Spates, MD;  Location: [place];  Service: Endoscopy;  Laterality: N/A;  . ESOPHAGOGASTRODUODENOSCOPY (EGD) WITH PROPOFOL N/A 08/13/2017   Procedure: ESOPHAGOGASTRODUODENOSCOPY (EGD) WITH PROPOFOL;  Surgeon: Laurence Spates, MD;  Location: [place];  Service: Endoscopy;  Laterality: N/A;  . IR GENERIC HISTORICAL  06/19/2016   IR VERTEBROPLASTY CERV/THOR BX INC UNI/BIL INC/INJECT/IMAGING 06/19/2016 Luanne Bras, MD [facility]  . IR GENERIC HISTORICAL  06/19/2016   IR VERTEBROPLASTY EA ADDL (T&LS) BX INC UNI/BIL INC  INJECT/IMAGING 06/19/2016 Luanne Bras, MD [facility]  . PATENT FORAMEN OVALE CLOSURE  09/03/2016  . PATENT FORAMEN OVALE(PFO) CLOSURE N/A 09/03/2016   Procedure: Patent [place](PFO) Closure;  Surgeon: [name], MD;  Location: [place] [facility];  Service: Cardiovascular;  Laterality: N/A;  . TEE WITHOUT CARDIOVERSION N/A 02/29/2016   Procedure: TRANSESOPHAGEAL ECHOCARDIOGRAM (TEE);  Surgeon: [name], MD;  Location: [place];  Service: Cardiovascular;  Laterality: N/A;  . TOE SURGERY Right 2020   Social History   Tobacco Use  . Smoking status: Never Smoker  . Smokeless tobacco: Never Used  Substance Use Topics  . Alcohol use: No    ROS  Review of Systems  Cardiovascular: Positive for dyspnea on exertion and leg swelling (mild and chronic). Negative for chest pain.  Gastrointestinal: Negative for melena.   Objective  There were no vitals taken for this visit.  Vitals with BMI 07/02/2019 01/26/2019 11/30/2018  Height 5' 4"  - 5' 4"   Weight 178 lbs 8 oz - 174 lbs  BMI 76.28 - 31.51  Systolic 761 607 371  Diastolic 89 53 71  Pulse 70 66 72    Physical Exam  Constitutional: Vital signs are normal. She appears well-developed and well-nourished.  Cardiovascular: Normal rate, regular rhythm and intact distal pulses.  Murmur heard.  Midsystolic murmur is present with a grade of 1/6 at the apex. Pulses:      Dorsalis pedis pulses are 2+ on the right side and 2+ on the left side.       Posterior tibial pulses are 2+ on the right side and 2+ on  the left side.  Varicosities bilateral lower extremities  Pulmonary/Chest: Effort normal and breath sounds normal. No accessory muscle usage. No respiratory distress.  Abdominal: Soft. Bowel sounds are normal.  Vitals reviewed.  Laboratory examination:   No results for input(s): NA, K, CL, CO2, GLUCOSE, BUN, CREATININE, CALCIUM, GFRNONAA, GFRAA in the last 8760 hours. CrCl cannot be calculated (Patient's most recent lab  result is older than the maximum 21 days allowed.).  CMP Latest Ref Rng & Units 06/27/2018 07/08/2016 06/18/2016  Glucose 70 - 99 mg/dL 92 86 100(H)  BUN 8 - 23 mg/dL 15 10 7   Creatinine 0.44 - 1.00 mg/dL 1.23(H) 0.71 0.70  Sodium 135 - 145 mmol/L 136 139 137  Potassium 3.5 - 5.1 mmol/L 3.6 4.5 4.1  Chloride 98 - 111 mmol/L 96(L) 101 99(L)  CO2 22 - 32 mmol/L 31 31 30   Calcium 8.9 - 10.3 mg/dL 9.5 9.8 9.5  Total Protein 6.5 - 8.1 g/dL 7.3 - -  Total Bilirubin 0.3 - 1.2 mg/dL 0.7 - -  Alkaline Phos 38 - 126 U/L 32(L) - -  AST 15 - 41 U/L 21 - -  ALT 0 - 44 U/L 13 - -   CBC Latest Ref Rng & Units 06/27/2018 07/08/2016 06/18/2016  WBC 4.0 - 10.5 K/uL 4.7 3.0(L) 3.7(L)  Hemoglobin 12.0 - 15.0 g/dL 10.3(L) 10.8(L) 11.0(L)  Hematocrit 36.0 - 46.0 % 31.8(L) 33.2(L) 34.7(L)  Platelets 150 - 400 K/uL 133(L) 157 151   Lipid Panel     Component Value Date/Time   CHOL 191 10/25/2010 0538   TRIG 27 10/25/2010 0538   HDL 90 10/25/2010 0538   CHOLHDL 2.1 10/25/2010 0538   VLDL 5 10/25/2010 0538   LDLCALC 96 10/25/2010 0538   HEMOGLOBIN A1C No results found for: HGBA1C, MPG TSH No results for input(s): TSH in the last 8760 hours.  External labs   Hemoglobin 10.300 06/27/2018; INR 1.740 06/27/2018; Platelets 133.000 06/27/2018  Creatinine, Serum 1.230 06/27/2018 Potassium 3.600 06/27/2018 ALT (SGPT) 13.000 06/27/2018  Labs 11/25/18: Total cholesterol 145, triglycerides 48, HDL 77, LDL 54.  BUN 20, creatinine 1.04, EGFR 62 mL.  CMP otherwise normal.  Hb 10.5/HCT 32.4, platelets 117.   Medications and allergies  No Known Allergies   Current Outpatient Medications  Medication Instructions  . acetaminophen (TYLENOL) 650 mg, Oral, Daily PRN  . atorvastatin (LIPITOR) 20 MG tablet TAKE 1 TABLET BY MOUTH EVERY DAY  . ferrous sulfate 325 mg, Oral, Daily  . ipratropium-albuterol (DUONEB) 0.5-2.5 (3) MG/3ML SOLN 3 mLs, Nebulization, Every 6 hours PRN  . lisinopril (ZESTRIL) 20 mg, Oral, As directed   . losartan (COZAAR) 25 MG tablet TAKE 1 TABLET EVERY DAY FOR BLOOD PRESSURE  . metoprolol succinate (TOPROL-XL) 50 mg, Oral, Daily, Take with or immediately following a meal.   . ondansetron (ZOFRAN) 4 mg, Oral, Every 8 hours PRN  . PROAIR HFA 108 (90 Base) MCG/ACT inhaler 2 puffs, Inhalation, 4 times daily PRN  . RESTASIS 0.05 % ophthalmic emulsion 1 drop, 2 times daily  . triamterene-hydrochlorothiazide (MAXZIDE-25) 37.5-25 MG tablet TAKE 1 TABLET BY MOUTH EVERY DAY  . Vitamin D (Ergocalciferol) (DRISDOL) 50,000 Units, Oral, Every Wed  . Xarelto 20 mg, Oral, Daily    Radiology:  No results found.  Cardiac Studies:   PFO closure 09/03/2016: Interventional data: Successful closure of the atrial septal defect under intracardiac echo guidance with implantation of a 25 millimeter septal occluder. Post-deployment double contrast study revealed no residual shunting.  Lexiscan Thalium  stress 03/20/2017:  1. Resting EKG: NSR. Non diagnostic stress EKG due to pharmacologic stress.  2. Normal SPECT study with normal left ventricular systolic function. Low risk study.  Echocardiogram 07/15/2019:  Normal LV systolic function with visual EF 55-60%. No obvious regional  wall motion abnormalities. Left ventricle cavity is normal in size. Normal  wall thickness. Calculated EF 60%.  Left atrial cavity is mildly dilated. Interatrial septal occluder is in  good position without obvious residual shunting or thrombus.  Visually, right atrial cavity appears mildly dilated.  Visually, right ventricle cavity appears mildly dilated. Grossly normal  right ventricular function.  Mild (Grade I) aortic regurgitation.  Moderate (Grade II) mitral regurgitation.  Mild to moderate tricuspid regurgitation.  Mild pulmonic regurgitation.  IVC is dilated with a respiratory response of >50%.  Prior study 11/18/2018: LVEF 82%, Grade 2 diastolic dysfunction, elevated  LAP, no significant change in the valvular heart  disease.  EKG: EKG 07/02/2019: Atrial fibrillation with controlled ventricular response at 73 bpm, normal axis, nonspecific T wave abnormality.   Assessment     ICD-10-CM   1. Atrial fibrillation with controlled ventricular response (HCC) CHA2DS2-VASc score- 3 with yearly risk of stroke-3.2%  I48.91   2. Dyspnea on exertion  R06.00   3. Status post patent foramen ovale closure  Z87.74   4. Essential hypertension  I10     No orders of the defined types were placed in this encounter.   There are no discontinued medications.   Recommendations:   Sevanna Ballengee  is a 74 y.o. with paroxysmal atrial fibrillation with history of cardioversion in 2018 maintaining sinus rhythm since, history of PFO closure in 2018, hypertension, hyperlipidemia, and pancytopenia.  She was noted to be in atrial fibrillation on 07/02/2019.  Echocardiogram obtained she now presents for follow-up of atrial fibrillation and also recent worsening dyspnea.  I reviewed the results of the echocardiogram.  Due to persistent symptoms of dyspnea, I have recommended proceeding with direct-current cardioversion.  She has been on long-term anticoagulation with Xarelto and although has pancytopenia, CBC has remained stable over the years.  Heart rate is controlled, hence no changes in the medications were done today.  I will see her back after the direct-current cardioversion.  Schedule for Direct current cardioversion. I have discussed regarding risks benefits rate control vs rhythm control with the patient. Patient understands cardiac arrest and need for CPR, aspiration pneumonia, but not limited to these. Patient is willing.   Her blood pressure is slightly elevated today, I would like to increase her losartan, she is on triamterene and also potassium supplements, I would like to obtain a BMP and make these changes on her next office visit.  She also has chronic anemia, I will obtain anemia panel with a lab draw.  [name], MD, [facility] 07/22/2019, 10:19 PM [facility]. [place] Office: [phone]

## 2019-07-23 ENCOUNTER — Other Ambulatory Visit: Payer: Self-pay

## 2019-07-23 ENCOUNTER — Encounter: Payer: Self-pay | Admitting: Cardiology

## 2019-07-23 ENCOUNTER — Ambulatory Visit: Payer: Medicaid Other | Admitting: Cardiology

## 2019-07-23 VITALS — BP 140/90 | HR 73 | Temp 98.3°F | Resp 14 | Ht 64.0 in | Wt 174.6 lb

## 2019-07-23 DIAGNOSIS — I10 Essential (primary) hypertension: Secondary | ICD-10-CM

## 2019-07-23 DIAGNOSIS — Z8774 Personal history of (corrected) congenital malformations of heart and circulatory system: Secondary | ICD-10-CM

## 2019-07-23 DIAGNOSIS — R0609 Other forms of dyspnea: Secondary | ICD-10-CM

## 2019-07-23 DIAGNOSIS — D509 Iron deficiency anemia, unspecified: Secondary | ICD-10-CM

## 2019-07-23 DIAGNOSIS — I4891 Unspecified atrial fibrillation: Secondary | ICD-10-CM

## 2019-07-27 ENCOUNTER — Other Ambulatory Visit: Payer: Self-pay

## 2019-07-27 ENCOUNTER — Ambulatory Visit
Admission: RE | Admit: 2019-07-27 | Discharge: 2019-07-27 | Disposition: A | Discharge status: Home / Self Care | Payer: Medicaid Other | Source: Ambulatory Visit | Attending: Internal Medicine | Admitting: Internal Medicine

## 2019-07-27 DIAGNOSIS — Z1231 Encounter for screening mammogram for malignant neoplasm of breast: Secondary | ICD-10-CM

## 2019-07-28 LAB — VITAMIN B12: Vitamin B-12: 875 pg/mL (ref 232–1245)

## 2019-07-28 LAB — IRON AND TIBC
Iron Saturation: 24 % (ref 15–55)
Iron: 75 ug/dL (ref 27–139)
Total Iron Binding Capacity: 315 ug/dL (ref 250–450)
UIBC: 240 ug/dL (ref 118–369)

## 2019-07-28 LAB — CBC
Hematocrit: 31.7 % — ABNORMAL LOW (ref 34.0–46.6)
Hemoglobin: 10.8 g/dL — ABNORMAL LOW (ref 11.1–15.9)
MCH: 32.5 pg (ref 26.6–33.0)
MCHC: 34.1 g/dL (ref 31.5–35.7)
MCV: 96 fL (ref 79–97)
Platelets: 138 10*3/uL — ABNORMAL LOW (ref 150–450)
RBC: 3.32 x10E6/uL — ABNORMAL LOW (ref 3.77–5.28)
RDW: 12.6 % (ref 11.7–15.4)
WBC: 3 10*3/uL — ABNORMAL LOW (ref 3.4–10.8)

## 2019-07-28 LAB — BASIC METABOLIC PANEL
BUN/Creatinine Ratio: 16 (ref 12–28)
BUN: 16 mg/dL (ref 8–27)
CO2: 31 mmol/L — ABNORMAL HIGH (ref 20–29)
Calcium: 9.8 mg/dL (ref 8.7–10.3)
Chloride: 91 mmol/L — ABNORMAL LOW (ref 96–106)
Creatinine, Ser: 1.03 mg/dL — ABNORMAL HIGH (ref 0.57–1.00)
GFR calc Af Amer: 62 mL/min/{1.73_m2} (ref >59)
GFR calc non Af Amer: 54 mL/min/{1.73_m2} — ABNORMAL LOW (ref >59)
Glucose: 90 mg/dL (ref 65–99)
Potassium: 4.4 mmol/L (ref 3.5–5.2)
Sodium: 133 mmol/L — ABNORMAL LOW (ref 134–144)

## 2019-07-28 LAB — FERRITIN: Ferritin: 166 ng/mL — ABNORMAL HIGH (ref 15–150)

## 2019-07-28 LAB — FOLATE: Folate: 12.9 ng/mL (ref >3.0)

## 2019-07-30 ENCOUNTER — Other Ambulatory Visit (HOSPITAL_COMMUNITY)
Admission: RE | Admit: 2019-07-30 | Discharge: 2019-07-30 | Disposition: A | Discharge status: Home / Self Care | Payer: Medicaid Other | Source: Ambulatory Visit | Attending: Cardiology | Admitting: Cardiology

## 2019-07-30 ENCOUNTER — Other Ambulatory Visit (HOSPITAL_COMMUNITY): Payer: Self-pay

## 2019-07-30 DIAGNOSIS — Z01812 Encounter for preprocedural laboratory examination: Secondary | ICD-10-CM | POA: Insufficient documentation

## 2019-07-30 DIAGNOSIS — Z20822 Contact with and (suspected) exposure to covid-19: Secondary | ICD-10-CM | POA: Diagnosis not present

## 2019-07-30 LAB — SARS CORONAVIRUS 2 (TAT 6-24 HRS): SARS Coronavirus 2: NEGATIVE

## 2019-08-01 NOTE — Progress Notes (Signed)
Stable kidney function over the past 1 year, mild anemia that is stable for the past several years, iron studies are within normal limits, Covid negative.

## 2019-08-03 ENCOUNTER — Other Ambulatory Visit: Payer: Self-pay

## 2019-08-03 ENCOUNTER — Ambulatory Visit (HOSPITAL_COMMUNITY)
Admission: RE | Admit: 2019-08-03 | Discharge: 2019-08-03 | Disposition: A | Discharge status: Home / Self Care | Payer: Medicaid Other | Attending: Cardiology | Admitting: Cardiology

## 2019-08-03 ENCOUNTER — Ambulatory Visit (HOSPITAL_COMMUNITY): Payer: Medicaid Other | Admitting: Anesthesiology

## 2019-08-03 ENCOUNTER — Encounter (HOSPITAL_COMMUNITY)
Admission: RE | Disposition: A | Discharge status: Home / Self Care | Payer: Self-pay | Source: Home / Self Care | Attending: Cardiology

## 2019-08-03 ENCOUNTER — Encounter (HOSPITAL_COMMUNITY): Payer: Self-pay | Admitting: Cardiology

## 2019-08-03 DIAGNOSIS — K219 Gastro-esophageal reflux disease without esophagitis: Secondary | ICD-10-CM | POA: Insufficient documentation

## 2019-08-03 DIAGNOSIS — E7800 Pure hypercholesterolemia, unspecified: Secondary | ICD-10-CM | POA: Insufficient documentation

## 2019-08-03 DIAGNOSIS — Z8774 Personal history of (corrected) congenital malformations of heart and circulatory system: Secondary | ICD-10-CM | POA: Insufficient documentation

## 2019-08-03 DIAGNOSIS — I10 Essential (primary) hypertension: Secondary | ICD-10-CM | POA: Diagnosis not present

## 2019-08-03 DIAGNOSIS — Z79899 Other long term (current) drug therapy: Secondary | ICD-10-CM | POA: Diagnosis not present

## 2019-08-03 DIAGNOSIS — Z7901 Long term (current) use of anticoagulants: Secondary | ICD-10-CM | POA: Insufficient documentation

## 2019-08-03 DIAGNOSIS — I4819 Other persistent atrial fibrillation: Secondary | ICD-10-CM

## 2019-08-03 DIAGNOSIS — M170 Bilateral primary osteoarthritis of knee: Secondary | ICD-10-CM | POA: Diagnosis not present

## 2019-08-03 DIAGNOSIS — I480 Paroxysmal atrial fibrillation: Secondary | ICD-10-CM | POA: Diagnosis present

## 2019-08-03 HISTORY — PX: CARDIOVERSION: SHX1299

## 2019-08-03 SURGERY — CARDIOVERSION
Anesthesia: General

## 2019-08-03 MED ORDER — LIDOCAINE 2% (20 MG/ML) 5 ML SYRINGE
INTRAMUSCULAR | Status: DC | PRN
  Administered 2019-08-03: 20 mg via INTRAVENOUS

## 2019-08-03 MED ORDER — SODIUM CHLORIDE 0.9 % IV SOLN: INTRAVENOUS | Status: DC | PRN

## 2019-08-03 MED ORDER — PROPOFOL 10 MG/ML IV BOLUS
INTRAVENOUS | Status: DC | PRN
  Administered 2019-08-03: 50 mg via INTRAVENOUS

## 2019-08-03 MED ORDER — SODIUM CHLORIDE 0.9 % IV SOLN
INTRAVENOUS | Status: AC | PRN
  Administered 2019-08-03: 500 mL via INTRAVENOUS

## 2019-08-03 NOTE — Discharge Instructions (Signed)
Electrical Cardioversion Electrical cardioversion is the delivery of a jolt of electricity to restore a normal rhythm to the heart. A rhythm that is too fast or is not regular keeps the heart from pumping well. In this procedure, sticky patches or metal paddles are placed on the chest to deliver electricity to the heart from a device. This procedure may be done in an emergency if:  There is low or no blood pressure as a result of the heart rhythm.  Normal rhythm must be restored as fast as possible to protect the brain and heart from further damage.  It may save a life. This may also be a scheduled procedure for irregular or fast heart rhythms that are not immediately life-threatening. Tell a health care provider about:  Any allergies you have.  All medicines you are taking, including vitamins, herbs, eye drops, creams, and over-the-counter medicines.  Any problems you or family members have had with anesthetic medicines.  Any blood disorders you have.  Any surgeries you have had.  Any medical conditions you have.  Whether you are pregnant or may be pregnant. What are the risks? Generally, this is a safe procedure. However, problems may occur, including:  Allergic reactions to medicines.  A blood clot that breaks free and travels to other parts of your body.  The possible return of an abnormal heart rhythm within hours or days after the procedure.  Your heart stopping (cardiac arrest). This is rare. What happens before the procedure? Medicines  Your health care provider may have you start taking: ? Blood-thinning medicines (anticoagulants) so your blood does not clot as easily. ? Medicines to help stabilize your heart rate and rhythm.  Ask your health care provider about: ? Changing or stopping your regular medicines. This is especially important if you are taking diabetes medicines or blood thinners. ? Taking medicines such as aspirin and ibuprofen. These medicines can  thin your blood. Do not take these medicines unless your health care provider tells you to take them. ? Taking over-the-counter medicines, vitamins, herbs, and supplements. General instructions  Follow instructions from your health care provider about eating or drinking restrictions.  Plan to have someone take you home from the hospital or clinic.  If you will be going home right after the procedure, plan to have someone with you for 24 hours.  Ask your health care provider what steps will be taken to help prevent infection. These may include washing your skin with a germ-killing soap. What happens during the procedure?   An IV will be inserted into one of your veins.  Sticky patches (electrodes) or metal paddles may be placed on your chest.  You will be given a medicine to help you relax (sedative).  An electrical shock will be delivered. The procedure may vary among health care providers and hospitals. What can I expect after the procedure?  Your blood pressure, heart rate, breathing rate, and blood oxygen level will be monitored until you leave the hospital or clinic.  Your heart rhythm will be watched to make sure it does not change.  You may have some redness on the skin where the shocks were given. Follow these instructions at home:  Do not drive for 24 hours if you were given a sedative during your procedure.  Take over-the-counter and prescription medicines only as told by your health care provider.  Ask your health care provider how to check your pulse. Check it often.  Rest for 48 hours after the procedure or   as told by your health care provider.  Avoid or limit your caffeine use as told by your health care provider.  Keep all follow-up visits as told by your health care provider. This is important. Contact a health care provider if:  You feel like your heart is beating too quickly or your pulse is not regular.  You have a serious muscle cramp that does not go  away. Get help right away if:  You have discomfort in your chest.  You are dizzy or you feel faint.  You have trouble breathing or you are short of breath.  Your speech is slurred.  You have trouble moving an arm or leg on one side of your body.  Your fingers or toes turn cold or blue. Summary  Electrical cardioversion is the delivery of a jolt of electricity to restore a normal rhythm to the heart.  This procedure may be done right away in an emergency or may be a scheduled procedure if the condition is not an emergency.  Generally, this is a safe procedure.  After the procedure, check your pulse often as told by your health care provider. This information is not intended to replace advice given to you by your health care provider. Make sure you discuss any questions you have with your health care provider. Document Revised: 11/30/2018 Document Reviewed: 11/30/2018 Elsevier Patient Education  2020 [employer]  

## 2019-08-03 NOTE — Anesthesia Preprocedure Evaluation (Addendum)
Anesthesia Evaluation  Patient identified by MRN, date of birth, ID band Patient awake    Reviewed: Allergy & Precautions, NPO status , Patient's Chart, lab work & pertinent test results  Airway Mallampati: II  TM Distance: >3 FB Neck ROM: Full    Dental  (+) Teeth Intact   Pulmonary    breath sounds clear to auscultation       Cardiovascular hypertension,  Rhythm:Irregular Rate:Normal     Neuro/Psych    GI/Hepatic   Endo/Other    Renal/GU      Musculoskeletal   Abdominal   Peds  Hematology   Anesthesia Other Findings   Reproductive/Obstetrics                             Anesthesia Physical Anesthesia Plan  ASA: III  Anesthesia Plan: General   Post-op Pain Management:    Induction: Intravenous  PONV Risk Score and Plan:   Airway Management Planned: Mask  Additional Equipment:   Intra-op Plan:   Post-operative Plan:   Informed Consent: I have reviewed the patients History and Physical, chart, labs and discussed the procedure including the risks, benefits and alternatives for the proposed anesthesia with the patient or authorized representative who has indicated his/her understanding and acceptance.       Plan Discussed with: CRNA and Anesthesiologist  Anesthesia Plan Comments:         Anesthesia Quick Evaluation  

## 2019-08-03 NOTE — Transfer of Care (Signed)
Immediate Anesthesia Transfer of Care Note  Patient: Morgan Bowers  Procedure(s) Performed: CARDIOVERSION (N/A )  Patient Location: Endoscopy Unit  Anesthesia Type:General  Level of Consciousness: awake and drowsy  Airway & Oxygen Therapy: Patient Spontanous Breathing  Post-op Assessment: Report given to RN and Post -op Vital signs reviewed and stable  Post vital signs: Reviewed and stable  Last Vitals:  Vitals Value Taken Time  BP    Temp    Pulse    Resp    SpO2      Last Pain:  Vitals:   08/03/19 0850  TempSrc: Oral  PainSc: 0-No pain         Complications: No apparent anesthesia complications

## 2019-08-03 NOTE — Anesthesia Postprocedure Evaluation (Signed)
Anesthesia Post Note  Patient: Morgan Bowers  Procedure(s) Performed: CARDIOVERSION (N/A )     Patient location during evaluation: Endoscopy Anesthesia Type: General Level of consciousness: awake and alert Pain management: pain level controlled Vital Signs Assessment: post-procedure vital signs reviewed and stable Respiratory status: spontaneous breathing, nonlabored ventilation, respiratory function stable and patient connected to nasal cannula oxygen Cardiovascular status: stable and blood pressure returned to baseline Postop Assessment: no apparent nausea or vomiting Anesthetic complications: no    Last Vitals:  Vitals:   08/03/19 1100 08/03/19 1103  BP: 119/73 119/73  Pulse: (!) 57 (!) 53  Resp: 17 16  Temp:    SpO2: 100% 100%    Last Pain:  Vitals:   08/03/19 1056  TempSrc:   PainSc: 0-No pain                 Saraiya Kozma COKER

## 2019-08-03 NOTE — Interval H&P Note (Signed)
History and Physical Interval Note:  08/03/2019 9:45 AM  Morgan Bowers  has presented today for surgery, with the diagnosis of AFIB.  The various methods of treatment have been discussed with the patient and family. After consideration of risks, benefits and other options for treatment, the patient has consented to  Procedure(s): CARDIOVERSION (N/A) as a surgical intervention.  The patient's history has been reviewed, patient examined, no change in status, stable for surgery.  I have reviewed the patient's chart and labs.  Questions were answered to the patient's satisfaction.     [place]

## 2019-08-03 NOTE — Anesthesia Procedure Notes (Addendum)
Procedure Name: General with mask airway Date/Time: 08/03/2019 10:26 AM Performed by: Harden Mo, CRNA Pre-anesthesia Checklist: Emergency Drugs available, Suction available, Patient being monitored and Patient identified Patient Re-evaluated:Patient Re-evaluated prior to induction Oxygen Delivery Method: Ambu bag Preoxygenation: Pre-oxygenation with 100% oxygen Induction Type: IV induction Placement Confirmation: positive ETCO2 and breath sounds checked- equal and bilateral Dental Injury: Teeth and Oropharynx as per pre-operative assessment

## 2019-08-03 NOTE — Interval H&P Note (Signed)
History and Physical Interval Note:  08/03/2019 10:31 AM  Morgan Bowers  has presented today for surgery, with the diagnosis of AFIB.  The various methods of treatment have been discussed with the patient and family. After consideration of risks, benefits and other options for treatment, the patient has consented to  Procedure(s): CARDIOVERSION (N/A) as a surgical intervention.  The patient's history has been reviewed, patient examined, no change in status, stable for surgery.  I have reviewed the patient's chart and labs.  Questions were answered to the patient's satisfaction.     [name]

## 2019-08-03 NOTE — CV Procedure (Signed)
Direct current cardioversion:  Indication symptomatic A. Fibrillation.  Procedure: Using 50 mg of IV Propofol and 2 IV Lidocaine (for reducing venous pain) for achieving deep sedation, synchronized direct current cardioversion performed. Patient was delivered with 100 Joules of electricity X 1 with success to NSR. Patient tolerated the procedure well. No immediate complication noted.   [name], MD, [facility] 08/03/2019, 10:38 AM [facility]. [place] Office: [phone]

## 2019-08-09 ENCOUNTER — Telehealth: Payer: Self-pay

## 2019-08-09 NOTE — Telephone Encounter (Signed)
VM [id number] : Patient daughter called and left a message stating that patient has been having chest discomfort since after the cardioversion. She is concerned fir the patient and wants to know what she should do? Please advise.

## 2019-08-10 NOTE — Telephone Encounter (Signed)
Called and spoke with patient regarding her chest discomfort. She will take tylenol and call us if she has anymore issues.

## 2019-08-10 NOTE — Telephone Encounter (Signed)
Chest pain could be from the shock we delivered and I pressing on her chest. Should be okay and can take tylenol. Call if problems

## 2019-08-14 ENCOUNTER — Other Ambulatory Visit: Payer: Self-pay | Admitting: Cardiology

## 2019-08-20 ENCOUNTER — Other Ambulatory Visit: Payer: Self-pay

## 2019-08-20 ENCOUNTER — Ambulatory Visit (INDEPENDENT_AMBULATORY_CARE_PROVIDER_SITE_OTHER): Payer: Medicaid Other | Admitting: Cardiology

## 2019-08-20 ENCOUNTER — Encounter: Payer: Self-pay | Admitting: Cardiology

## 2019-08-20 VITALS — BP 108/74 | HR 62 | Temp 98.2°F | Resp 17 | Ht 64.0 in | Wt 174.0 lb

## 2019-08-20 DIAGNOSIS — I480 Paroxysmal atrial fibrillation: Secondary | ICD-10-CM | POA: Insufficient documentation

## 2019-08-20 DIAGNOSIS — I4821 Permanent atrial fibrillation: Secondary | ICD-10-CM | POA: Insufficient documentation

## 2019-08-20 DIAGNOSIS — R0609 Other forms of dyspnea: Secondary | ICD-10-CM

## 2019-08-20 DIAGNOSIS — I10 Essential (primary) hypertension: Secondary | ICD-10-CM

## 2019-08-20 NOTE — Progress Notes (Signed)
Primary Physician/Referring:  Nolene Ebbs, MD  Patient ID: Morgan Bowers, female    DOB: 03-25-1946, 74 y.o.   MRN: [id number]  Chief Complaint  Patient presents with  . Atrial Fibrillation  . Follow-up    4 week  . Results    labs   HPI:    Morgan Bowers  is a 74 y.o. with h/o pancytopenia, negative GI work up in April 2019,  paroxysmal atrial fibrillation with history of cardioversion in 2018 maintaining sinus rhythm since, history of PFO closure in 2018, hypertension, hyperlipidemia, and pancytopenia.  She was noted to be in atrial fibrillation on 07/02/2019.  She also has anemia.  Due to persistent symptoms of dyspnea and fatigue, underwent direct-current cardioversion on 08/03/2019.  She now presents for follow-up. No history of hematuria, GI bleed  States that fatigue is slightly improved and also dyspnea but still has not been able to the back of his no PND or orthopnea.  Past Medical History:  Diagnosis Date  . A-fib ([place])   . Anemia   . Arthritis    "knees" (09/03/2016)  . GERD (gastroesophageal reflux disease)   . High cholesterol   . Hypertension    Past Surgical History:  Procedure Laterality Date  . CARDIOVERSION N/A 08/06/2016   Procedure: CARDIOVERSION;  Surgeon: [name], MD;  Location: [facility];  Service: Cardiovascular;  Laterality: N/A;  . CARDIOVERSION N/A 08/03/2019   Procedure: CARDIOVERSION;  Surgeon: [name], MD;  Location: [facility];  Service: Cardiovascular;  Laterality: N/A;  . COLONOSCOPY WITH PROPOFOL N/A 08/13/2017   Procedure: COLONOSCOPY WITH PROPOFOL;  Surgeon: Laurence Spates, MD;  Location: [facility];  Service: Endoscopy;  Laterality: N/A;  . ESOPHAGOGASTRODUODENOSCOPY (EGD) WITH PROPOFOL N/A 08/13/2017   Procedure: ESOPHAGOGASTRODUODENOSCOPY (EGD) WITH PROPOFOL;  Surgeon: Laurence Spates, MD;  Location: [facility];  Service: Endoscopy;  Laterality: N/A;  . IR GENERIC HISTORICAL  06/19/2016   IR VERTEBROPLASTY  CERV/THOR BX INC UNI/BIL INC/INJECT/IMAGING 06/19/2016 Luanne Bras, MD [facility]  . IR GENERIC HISTORICAL  06/19/2016   IR VERTEBROPLASTY EA ADDL (T&LS) BX INC UNI/BIL INC INJECT/IMAGING 06/19/2016 Luanne Bras, MD [facility]  . PATENT FORAMEN OVALE CLOSURE  09/03/2016  . PATENT FORAMEN OVALE(PFO) CLOSURE N/A 09/03/2016   Procedure: Patent Forament [place](PFO) Closure;  Surgeon: [name], MD;  Location: [facility];  Service: Cardiovascular;  Laterality: N/A;  . TEE WITHOUT CARDIOVERSION N/A 02/29/2016   Procedure: TRANSESOPHAGEAL ECHOCARDIOGRAM (TEE);  Surgeon: [name], MD;  Location: [facility];  Service: Cardiovascular;  Laterality: N/A;  . TOE SURGERY Right 2020   Social History   Tobacco Use  . Smoking status: Never Smoker  . Smokeless tobacco: Never Used  Substance Use Topics  . Alcohol use: No    ROS  Review of Systems  Cardiovascular: Positive for dyspnea on exertion and leg swelling (mild and chronic). Negative for chest pain.  Gastrointestinal: Negative for melena.   Objective  Blood pressure 108/74, pulse 62, temperature 98.2 F (36.8 C), temperature source Temporal, resp. rate 17, height 5' 4"  (1.626 m), weight 174 lb (78.9 kg), SpO2 100 %.  Vitals with BMI 08/20/2019 08/03/2019 08/03/2019  Height 5' 4"  - -  Weight 174 lbs - -  BMI 49.44 - -  Systolic 967 591 638  Diastolic 74 73 73  Pulse 62 53 57    Physical Exam  Constitutional: Vital signs are normal. She appears well-developed and well-nourished.  Cardiovascular: Normal rate, regular rhythm and intact distal pulses.  Murmur heard.  Midsystolic murmur is present with a grade of 1/6 at the apex. Pulses:      Dorsalis pedis pulses are 2+ on the right side and 2+ on the left side.       Posterior tibial pulses are 2+ on the right side and 2+ on the left side.  Varicosities bilateral lower extremities  Pulmonary/Chest: Effort normal and breath sounds normal. No accessory muscle usage. No  respiratory distress.  Abdominal: Soft. Bowel sounds are normal.  Vitals reviewed.  Laboratory examination:   Recent Labs    07/27/19 1027  NA 133*  K 4.4  CL 91*  CO2 31*  GLUCOSE 90  BUN 16  CREATININE 1.03*  CALCIUM 9.8  GFRNONAA 54*  GFRAA 62   CrCl cannot be calculated (Patient's most recent lab result is older than the maximum 21 days allowed.).  CMP Latest Ref Rng & Units 07/27/2019 06/27/2018 07/08/2016  Glucose 65 - 99 mg/dL 90 92 86  BUN 8 - 27 mg/dL 16 15 10   Creatinine 0.57 - 1.00 mg/dL 1.03(H) 1.23(H) 0.71  Sodium 134 - 144 mmol/L 133(L) 136 139  Potassium 3.5 - 5.2 mmol/L 4.4 3.6 4.5  Chloride 96 - 106 mmol/L 91(L) 96(L) 101  CO2 20 - 29 mmol/L 31(H) 31 31  Calcium 8.7 - 10.3 mg/dL 9.8 9.5 9.8  Total Protein 6.5 - 8.1 g/dL - 7.3 -  Total Bilirubin 0.3 - 1.2 mg/dL - 0.7 -  Alkaline Phos 38 - 126 U/L - 32(L) -  AST 15 - 41 U/L - 21 -  ALT 0 - 44 U/L - 13 -   CBC Latest Ref Rng & Units 07/27/2019 06/27/2018 07/08/2016  WBC 3.4 - 10.8 x10E3/uL 3.0(L) 4.7 3.0(L)  Hemoglobin 11.1 - 15.9 g/dL 10.8(L) 10.3(L) 10.8(L)  Hematocrit 34.0 - 46.6 % 31.7(L) 31.8(L) 33.2(L)  Platelets 150 - 450 x10E3/uL 138(L) 133(L) 157   Lipid Panel     Component Value Date/Time   CHOL 191 10/25/2010 0538   TRIG 27 10/25/2010 0538   HDL 90 10/25/2010 0538   CHOLHDL 2.1 10/25/2010 0538   VLDL 5 10/25/2010 0538   LDLCALC 96 10/25/2010 0538   HEMOGLOBIN A1C No results found for: HGBA1C, MPG TSH No results for input(s): TSH in the last 8760 hours.  External labs   Hemoglobin 10.300 06/27/2018; INR 1.740 06/27/2018; Platelets 133.000 06/27/2018  Creatinine, Serum 1.230 06/27/2018 Potassium 3.600 06/27/2018 ALT (SGPT) 13.000 06/27/2018  Labs 11/25/18: Total cholesterol 145, triglycerides 48, HDL 77, LDL 54.  BUN 20, creatinine 1.04, EGFR 62 mL.  CMP otherwise normal.  Hb 10.5/HCT 32.4, platelets 117.  Medications and allergies  No Known Allergies   Current Outpatient Medications   Medication Instructions  . acetaminophen (TYLENOL) 650 mg, Oral, Every 6 hours PRN  . albuterol (PROVENTIL) 2.5 mg, Nebulization, Every 6 hours PRN  . atorvastatin (LIPITOR) 20 MG tablet TAKE 1 TABLET BY MOUTH EVERY DAY  . ferrous sulfate 325 mg, Oral, Daily  . losartan (COZAAR) 25 mg, Oral, Daily  . metoprolol succinate (TOPROL-XL) 50 mg, Oral, Daily, Take with or immediately following a meal.   . PROAIR HFA 108 (90 Base) MCG/ACT inhaler 2 puffs, Inhalation, 4 times daily PRN  . RESTASIS 0.05 % ophthalmic emulsion 1 drop, Both Eyes, 2 times daily PRN  . triamterene-hydrochlorothiazide (MAXZIDE-25) 37.5-25 MG tablet TAKE 1 TABLET BY MOUTH EVERY DAY  . Vitamin D (Ergocalciferol) (DRISDOL) 50,000 Units, Oral, Every Wed  . XARELTO 20 MG TABS tablet TAKE  1 TABLET BY MOUTH EVERY DAY    Radiology:  No results found.  Cardiac Studies:   PFO closure 09/03/2016: Interventional data: Successful closure of the atrial septal defect under intracardiac echo guidance with implantation of a 25 millimeter septal occluder. Post-deployment double contrast study revealed no residual shunting.  Lexiscan Thalium stress 03/20/2017:  1. Resting EKG: NSR. Non diagnostic stress EKG due to pharmacologic stress.  2. Normal SPECT study with normal left ventricular systolic function. Low risk study.  Echocardiogram 07/15/2019:  Normal LV systolic function with visual EF 55-60%. No obvious regional  wall motion abnormalities. Left ventricle cavity is normal in size. Normal  wall thickness. Calculated EF 60%.  Left atrial cavity is mildly dilated. Interatrial septal occluder is in  good position without obvious residual shunting or thrombus.  Visually, right atrial cavity appears mildly dilated.  Visually, right ventricle cavity appears mildly dilated. Grossly normal  right ventricular function.  Mild (Grade I) aortic regurgitation.  Moderate (Grade II) mitral regurgitation.  Mild to moderate tricuspid  regurgitation.  Mild pulmonic regurgitation.  IVC is dilated with a respiratory response of >50%.  Prior study 11/18/2018: LVEF 27%, Grade 2 diastolic dysfunction, elevated  LAP, no significant change in the valvular heart disease.  EKG: EKG 07/02/2019: Atrial fibrillation with controlled ventricular response at 73 bpm, normal axis, nonspecific T wave abnormality.   Assessment     ICD-10-CM   1. Paroxysmal atrial fibrillation ([place]). CHA2DS2-VASc Score is 3.  Yearly risk of stroke: 3.2% (A, F, HTN).    I48.0 EKG 12-Lead  2. Dyspnea on exertion  R06.00   3. Essential hypertension  I10     No orders of the defined types were placed in this encounter.   There are no discontinued medications.   Recommendations:   Morgan Bowers  is a 74 y.o. with paroxysmal atrial fibrillation with history of cardioversion in 2018 maintaining sinus rhythm since, history of PFO closure in 2018, hypertension, hyperlipidemia, and pancytopenia.  She was noted to be in atrial fibrillation on 07/02/2019.  Echocardiogram obtained she now presents for follow-up of atrial fibrillation and also recent worsening dyspnea.  Due to persistent symptoms of dyspnea, she underwent direct-current cardioversion on 08/03/2019, she now presents for follow-up.  Has noticed mild improvement in fatigue and also dyspnea but still has dyspnea.  No PND orthopnea.  No clinical evidence of heart failure.  Advised her to increase her physical activity as tolerated, deconditioning may also be contributing to her symptoms.  No changes in the medications were done today.  I will see her back in 3 months for follow-up.  Blood pressure is well controlled and she is maintaining sinus rhythm.   She does have chronic anemia but appears to be normocytic anemia, iron studies are normal.  Copies of the labs sent to her PCP as well.  [name], MD, [facility] 08/20/2019, 12:39 PM [facility]. [place] Office: [phone]

## 2019-09-22 ENCOUNTER — Ambulatory Visit (INDEPENDENT_AMBULATORY_CARE_PROVIDER_SITE_OTHER): Payer: Medicaid Other

## 2019-09-22 ENCOUNTER — Other Ambulatory Visit: Payer: Self-pay

## 2019-09-22 ENCOUNTER — Telehealth: Payer: Self-pay

## 2019-09-22 ENCOUNTER — Ambulatory Visit (INDEPENDENT_AMBULATORY_CARE_PROVIDER_SITE_OTHER): Payer: Medicaid Other | Admitting: Orthopaedic Surgery

## 2019-09-22 ENCOUNTER — Ambulatory Visit: Payer: Self-pay

## 2019-09-22 VITALS — Ht 64.0 in | Wt 174.0 lb

## 2019-09-22 DIAGNOSIS — M1712 Unilateral primary osteoarthritis, left knee: Secondary | ICD-10-CM | POA: Diagnosis not present

## 2019-09-22 DIAGNOSIS — M1711 Unilateral primary osteoarthritis, right knee: Secondary | ICD-10-CM | POA: Diagnosis not present

## 2019-09-22 DIAGNOSIS — M25561 Pain in right knee: Secondary | ICD-10-CM

## 2019-09-22 DIAGNOSIS — M25562 Pain in left knee: Secondary | ICD-10-CM

## 2019-09-22 NOTE — Telephone Encounter (Signed)
Noted  

## 2019-09-22 NOTE — Telephone Encounter (Signed)
Bilateral knee gel injections 

## 2019-09-22 NOTE — Progress Notes (Signed)
Office Visit Note   Patient: Morgan Bowers           Date of Birth: 1945-05-20           MRN: [id number] Visit Date: 09/22/2019              Requested by: Nolene Ebbs, MD [address] [place],  [place] [address] PCP: Nolene Ebbs, MD   Assessment & Plan: Visit Diagnoses:  1. Left knee pain, unspecified chronicity   2. Right knee pain, unspecified chronicity   3. Unilateral primary osteoarthritis, right knee   4. Unilateral primary osteoarthritis, left knee     Plan: The patient understands that she has severe end-stage arthritis of both knees.  Given the fact that she has failed conservative treatment with steroid injections our next up will be at least trying 1 round of hyaluronic acid injections for both knees.  We will see if we can get this approved in order for her.  All questions and concerns were answered and addressed.  I did show her x-rays and went over these in detail as well as knee replacement models.  Her and her family want to try to stay as conservative as possible so I do feel hyaluronic acid is the next step to try.  They agree with this treatment plan.  All questions and concerns were answered and addressed.  We will see her back in 3 weeks to hopefully place hyaluronic acid in both knees.  Follow-Up Instructions: Return in about 3 weeks (around 10/13/2019).   Orders:  Orders Placed This Encounter  Procedures  . XR Knee 1-2 Views Left  . XR Knee 1-2 Views Right   No orders of the defined types were placed in this encounter.     Procedures: No procedures performed   Clinical Data: No additional findings.   Subjective: Chief Complaint  Patient presents with  . Left Knee - Pain  . Right Knee - Pain  The patient is a very pleasant 74 year old female that I am seeing for the first time.  She is sent from Dr. Jeanie Cooks her primary care physician to evaluate and treat bilateral knee osteoarthritis.  She has had steroid injections in both knees  for some time.  The steroid injections helpful about a month now.  She has had bilateral knee pain for many years now.  Her daughter is with her.  She says it definitely hurts on a daily basis.  Both knees hurt the same.  At this point they have detrimentally affected her mobility, her quality of life and her actives daily living.  She denies any acute medical issues.  She tries not to use a cane but sometimes has to on occasion.  HPI  Review of Systems She currently denies any headache, chest pain, shortness of breath, fever, chills, nausea, vomiting  Objective: Vital Signs: Ht 5\' 4"  (1.626 m)   Wt 174 lb (78.9 kg)   BMI 29.87 kg/m   Physical Exam She is alert and orient x3 and in no acute distress Ortho Exam Examination of both knees show a decent soft tissue envelope around both knees due to slight obesity.  Both knees have significant patellofemoral crepitation.  Both knees have full range of motion and are ligamentously stable but have significant medial and lateral joint line tenderness. Specialty Comments:  No specialty comments available.  Imaging: XR Knee 1-2 Views Left  Result Date: 09/22/2019 2 views of the left knee show severe end-stage arthritis.  There is  complete loss of the cartilage of the patellofemoral joint and severe joint space narrowing medially and laterally.  There are periarticular osteophytes throughout the knee.  XR Knee 1-2 Views Right  Result Date: 09/22/2019 2 views of the right knee show severe end-stage arthritis with severe patellofemoral disease.  There are large para-articular osteophytes in all 3 compartments.  There is significant medial and lateral joint space narrowing.    PMFS History: Patient Active Problem List   Diagnosis Date Noted  . Unilateral primary osteoarthritis, right knee 09/22/2019  . Unilateral primary osteoarthritis, left knee 09/22/2019  . Paroxysmal atrial fibrillation ([place]). CHA2DS2-VASc Score is 3.  Yearly risk of  stroke: 3.2% (A, F, HTN).   08/20/2019  . Persistent atrial fibrillation ([place])   . Nonrheumatic mitral valve regurgitation 11/30/2018  . Essential hypertension 11/27/2018  . Pancytopenia ([place]) 11/27/2018  . 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 Atrial fibrillation, currently in sinus rhythm 06/17/2016  . Pulmonary nodule 06/17/2016  . Anemia 06/17/2016  . Thoracic compression fracture ([place]) 06/16/2016  . Dyspnea 02/25/2016  . Status post patent foramen ovale closure 02/25/2016   Past Medical History:  Diagnosis Date  . A-fib ([place])   . Anemia   . Arthritis    "knees" (09/03/2016)  . GERD (gastroesophageal reflux disease)   . High cholesterol   . Hypertension     No family history on file.  Past Surgical History:  Procedure Laterality Date  . CARDIOVERSION N/A 08/06/2016   Procedure: CARDIOVERSION;  Surgeon: [name], MD;  Location: [facility];  Service: Cardiovascular;  Laterality: N/A;  . CARDIOVERSION N/A 08/03/2019   Procedure: CARDIOVERSION;  Surgeon: [name], MD;  Location: [facility];  Service: Cardiovascular;  Laterality: N/A;  . COLONOSCOPY WITH PROPOFOL N/A 08/13/2017   Procedure: COLONOSCOPY WITH PROPOFOL;  Surgeon: Laurence Spates, MD;  Location: [facility];  Service: Endoscopy;  Laterality: N/A;  . ESOPHAGOGASTRODUODENOSCOPY (EGD) WITH PROPOFOL N/A 08/13/2017   Procedure: ESOPHAGOGASTRODUODENOSCOPY (EGD) WITH PROPOFOL;  Surgeon: Laurence Spates, MD;  Location: [facility];  Service: Endoscopy;  Laterality: N/A;  . IR GENERIC HISTORICAL  06/19/2016   IR VERTEBROPLASTY CERV/THOR BX INC UNI/BIL INC/INJECT/IMAGING 06/19/2016 Luanne Bras, MD [facility]  . IR GENERIC HISTORICAL  06/19/2016   IR VERTEBROPLASTY EA ADDL (T&LS) BX INC UNI/BIL INC INJECT/IMAGING 06/19/2016 Luanne Bras, MD [facility]  . PATENT FORAMEN OVALE CLOSURE  09/03/2016  . PATENT FORAMEN OVALE(PFO) CLOSURE N/A 09/03/2016   Procedure: Patent Forament [place](PFO) Closure;  Surgeon: [name], MD;  Location: [facility];  Service: Cardiovascular;  Laterality: N/A;  . TEE WITHOUT CARDIOVERSION N/A 02/29/2016   Procedure: TRANSESOPHAGEAL ECHOCARDIOGRAM (TEE);  Surgeon: [name], MD;  Location: [facility];  Service: Cardiovascular;  Laterality: N/A;  . TOE SURGERY Right 2020   Social History   Occupational History  . Occupation: retired  Tobacco Use  . Smoking status: Never Smoker  . Smokeless tobacco: Never Used  Substance and Sexual Activity  . Alcohol use: No  . Drug use: No  . Sexual activity: Never

## 2019-09-24 NOTE — Telephone Encounter (Signed)
Will have to apply for [employer] due to patient having Medicaid for her insurance.

## 2019-09-27 ENCOUNTER — Telehealth: Payer: Self-pay

## 2019-09-27 NOTE — Telephone Encounter (Signed)
Mailed [employer] patient assistance application to patient's address listed in her chart for Monovisc, bilateral knee injection.

## 2019-09-28 ENCOUNTER — Other Ambulatory Visit: Payer: Self-pay | Admitting: Cardiology

## 2019-09-28 DIAGNOSIS — I4891 Unspecified atrial fibrillation: Secondary | ICD-10-CM

## 2019-10-12 ENCOUNTER — Telehealth: Payer: Self-pay

## 2019-10-12 NOTE — Telephone Encounter (Signed)
Faxed completed [employer] application to [phone] for Monovisc, bilateral knee.

## 2019-10-13 ENCOUNTER — Ambulatory Visit: Payer: Medicaid Other | Admitting: Orthopaedic Surgery

## 2019-10-21 ENCOUNTER — Telehealth: Payer: Self-pay

## 2019-10-21 NOTE — Telephone Encounter (Signed)
Received temporary approval letter from [employer] for Monovisc, bilateral knee due to [employer] needing a copy of application refaxed. Also received PRF to complete.  Faxed completed [employer] application and PRF to [phone].

## 2019-11-02 ENCOUNTER — Ambulatory Visit (INDEPENDENT_AMBULATORY_CARE_PROVIDER_SITE_OTHER): Payer: Medicaid Other | Admitting: Orthopaedic Surgery

## 2019-11-02 ENCOUNTER — Encounter: Payer: Self-pay | Admitting: Orthopaedic Surgery

## 2019-11-02 ENCOUNTER — Other Ambulatory Visit: Payer: Self-pay

## 2019-11-02 DIAGNOSIS — M1712 Unilateral primary osteoarthritis, left knee: Secondary | ICD-10-CM

## 2019-11-02 DIAGNOSIS — M1711 Unilateral primary osteoarthritis, right knee: Secondary | ICD-10-CM

## 2019-11-02 MED ORDER — HYALURONAN 88 MG/4ML IX SOSY
88.0000 mg | PREFILLED_SYRINGE | INTRA_ARTICULAR | Status: AC | PRN
  Administered 2019-11-02: 88 mg via INTRA_ARTICULAR

## 2019-11-02 NOTE — Progress Notes (Signed)
   Procedure Note  Patient: Morgan Bowers             Date of Birth: 1945/11/13           MRN: [id number]             Visit Date: 11/02/2019  Procedures: Visit Diagnoses:  1. Unilateral primary osteoarthritis, right knee   2. Unilateral primary osteoarthritis, left knee     Large Joint Inj: R knee on 11/02/2019 10:43 AM Indications: diagnostic evaluation and pain Details: 22 G 1.5 in needle, superolateral approach  Arthrogram: No  Medications: 88 mg Hyaluronan 88 MG/4ML Outcome: tolerated well, no immediate complications Procedure, treatment alternatives, risks and benefits explained, specific risks discussed. Consent was given by the patient. Immediately prior to procedure a time out was called to verify the correct patient, procedure, equipment, support staff and site/side marked as required. Patient was prepped and draped in the usual sterile fashion.   Large Joint Inj: L knee on 11/02/2019 10:43 AM Indications: diagnostic evaluation and pain Details: 22 G 1.5 in needle, superolateral approach  Arthrogram: No  Medications: 88 mg Hyaluronan 88 MG/4ML Outcome: tolerated well, no immediate complications Procedure, treatment alternatives, risks and benefits explained, specific risks discussed. Consent was given by the patient. Immediately prior to procedure a time out was called to verify the correct patient, procedure, equipment, support staff and site/side marked as required. Patient was prepped and draped in the usual sterile fashion.    The patient has known osteoarthritis of both her knees.  She is here today for scheduled hyaluronic acid injections with Monovisc to treat the pain from her osteoarthritis and given the failure of other conservative treatment measures including steroid injections.  She understands fully why we are recommending these types of injections.  The risk and benefits has been discussed in detail.  Her x-rays do show severe arthritis of both  knees.  Examination of both knees shows slight varus malalignment with no effusion.  Both knees have painful range of motion and medial lateral joint line tenderness.  I did place Monovisc in both knees without difficulty.  I had a long thorough discussion with her daughter about what we need to bring her back for her knees.  Follow-up can be as needed.

## 2019-11-04 ENCOUNTER — Telehealth: Payer: Self-pay

## 2019-11-04 NOTE — Telephone Encounter (Signed)
Received Monovisc from [employer] for bilateral knee for patient on 11/02/2019.

## 2019-11-24 ENCOUNTER — Other Ambulatory Visit: Payer: Self-pay

## 2019-11-24 ENCOUNTER — Ambulatory Visit: Payer: Medicaid Other | Admitting: Cardiology

## 2019-11-24 ENCOUNTER — Encounter: Payer: Self-pay | Admitting: Cardiology

## 2019-11-24 VITALS — BP 135/77 | HR 62 | Resp 16 | Ht 64.0 in | Wt 173.6 lb

## 2019-11-24 DIAGNOSIS — N1832 Chronic kidney disease, stage 3b: Secondary | ICD-10-CM

## 2019-11-24 DIAGNOSIS — I480 Paroxysmal atrial fibrillation: Secondary | ICD-10-CM

## 2019-11-24 DIAGNOSIS — R0609 Other forms of dyspnea: Secondary | ICD-10-CM

## 2019-11-24 DIAGNOSIS — I10 Essential (primary) hypertension: Secondary | ICD-10-CM

## 2019-11-24 MED ORDER — RIVAROXABAN 15 MG PO TABS: 15.0000 mg | ORAL_TABLET | Freq: Every day | ORAL | 3 refills | Status: DC

## 2019-11-24 NOTE — Progress Notes (Deleted)
Patient is presently doing well, she is maintaining Sinus rhythm by auscultation.  Continue Xarelto. She has stage 3 CKD. Will change to 15 mg after supper for Xarelto. OV in 6 months.

## 2019-11-24 NOTE — Progress Notes (Signed)
Primary Physician/Referring:  Nolene Ebbs, MD  Patient ID: Morgan Bowers, female    DOB: 1945-08-29, 74 y.o.   MRN: [id number]  Chief Complaint  Patient presents with  . Atrial Fibrillation  . Shortness of Breath  . Follow-up    3 MONTH   HPI:    Morgan Bowers  is a 74 y.o. with h/o pancytopenia, negative GI work up in April 2019,  paroxysmal atrial fibrillation with history of cardioversion in 2018 maintaining sinus rhythm since, history of PFO closure in 2018, hypertension, hyperlipidemia, and pancytopenia.  She was noted to be in atrial fibrillation on 07/02/2019.  She also has chronic anemia.  Due to persistent symptoms of dyspnea and fatigue, underwent direct-current cardioversion on 08/03/2019.  She now presents for follow-up. No history of hematuria, GI bleed  States that fatigue is slightly improved and also dyspnea but still has not been able to the back of his no PND or orthopnea.  Past Medical History:  Diagnosis Date  . A-fib ([place])   . Anemia   . Arthritis    "knees" (09/03/2016)  . GERD (gastroesophageal reflux disease)   . High cholesterol   . Hypertension    Past Surgical History:  Procedure Laterality Date  . CARDIOVERSION N/A 08/06/2016   Procedure: CARDIOVERSION;  Surgeon: [name], MD;  Location: [place];  Service: Cardiovascular;  Laterality: N/A;  . CARDIOVERSION N/A 08/03/2019   Procedure: CARDIOVERSION;  Surgeon: [name], MD;  Location: [place];  Service: Cardiovascular;  Laterality: N/A;  . COLONOSCOPY WITH PROPOFOL N/A 08/13/2017   Procedure: COLONOSCOPY WITH PROPOFOL;  Surgeon: Laurence Spates, MD;  Location: [place];  Service: Endoscopy;  Laterality: N/A;  . ESOPHAGOGASTRODUODENOSCOPY (EGD) WITH PROPOFOL N/A 08/13/2017   Procedure: ESOPHAGOGASTRODUODENOSCOPY (EGD) WITH PROPOFOL;  Surgeon: Laurence Spates, MD;  Location: [place];  Service: Endoscopy;  Laterality: N/A;  . IR GENERIC HISTORICAL  06/19/2016   IR  VERTEBROPLASTY CERV/THOR BX INC UNI/BIL INC/INJECT/IMAGING 06/19/2016 Luanne Bras, MD [facility]  . IR GENERIC HISTORICAL  06/19/2016   IR VERTEBROPLASTY EA ADDL (T&LS) BX INC UNI/BIL INC INJECT/IMAGING 06/19/2016 Luanne Bras, MD [facility]  . PATENT FORAMEN OVALE CLOSURE  09/03/2016  . PATENT FORAMEN OVALE(PFO) CLOSURE N/A 09/03/2016   Procedure: Patent Forament [place](PFO) Closure;  Surgeon: [name], MD;  Location: [place] [facility];  Service: Cardiovascular;  Laterality: N/A;  . TEE WITHOUT CARDIOVERSION N/A 02/29/2016   Procedure: TRANSESOPHAGEAL ECHOCARDIOGRAM (TEE);  Surgeon: [name], MD;  Location: [place];  Service: Cardiovascular;  Laterality: N/A;  . TOE SURGERY Right 2020   Social History   Tobacco Use  . Smoking status: Never Smoker  . Smokeless tobacco: Never Used  Substance Use Topics  . Alcohol use: No    ROS  Review of Systems  Cardiovascular: Positive for dyspnea on exertion and leg swelling (mild and chronic). Negative for chest pain.  Respiratory: Positive for wheezing.   Gastrointestinal: Negative for melena.   Objective  Resp. rate 16, height 5' 4"  (1.626 m).  Vitals with BMI 11/24/2019 09/22/2019 08/20/2019  Height 5' 4"  5' 4"  5' 4"   Weight - 174 lbs 174 lbs  BMI - 97.94 80.16  Systolic - - 553  Diastolic - - 74  Pulse - - 62    Physical Exam Vitals reviewed.  Constitutional:      Appearance: She is well-developed.  Cardiovascular:     Rate and Rhythm: Normal rate and regular rhythm.     Pulses: Intact distal  pulses.          Dorsalis pedis pulses are 2+ on the right side and 2+ on the left side.       Posterior tibial pulses are 2+ on the right side and 2+ on the left side.     Heart sounds: Murmur heard.  Midsystolic murmur is present with a grade of 1/6 at the apex.      Comments: Varicosities bilateral lower extremities Pulmonary:     Effort: Pulmonary effort is normal. No accessory muscle usage or respiratory distress.      Breath sounds: Examination of the right-middle field reveals wheezing. Examination of the left-middle field reveals wheezing. Examination of the right-lower field reveals wheezing. Examination of the left-lower field reveals wheezing. Wheezing present.  Abdominal:     General: Bowel sounds are normal.     Palpations: Abdomen is soft.    Laboratory examination:   Recent Labs    07/27/19 1027  NA 133*  K 4.4  CL 91*  CO2 31*  GLUCOSE 90  BUN 16  CREATININE 1.03*  CALCIUM 9.8  GFRNONAA 54*  GFRAA 62   CrCl cannot be calculated (Patient's most recent lab result is older than the maximum 21 days allowed.).  CMP Latest Ref Rng & Units 07/27/2019 06/27/2018 07/08/2016  Glucose 65 - 99 mg/dL 90 92 86  BUN 8 - 27 mg/dL 16 15 10   Creatinine 0.57 - 1.00 mg/dL 1.03(H) 1.23(H) 0.71  Sodium 134 - 144 mmol/L 133(L) 136 139  Potassium 3.5 - 5.2 mmol/L 4.4 3.6 4.5  Chloride 96 - 106 mmol/L 91(L) 96(L) 101  CO2 20 - 29 mmol/L 31(H) 31 31  Calcium 8.7 - 10.3 mg/dL 9.8 9.5 9.8  Total Protein 6.5 - 8.1 g/dL - 7.3 -  Total Bilirubin 0.3 - 1.2 mg/dL - 0.7 -  Alkaline Phos 38 - 126 U/L - 32(L) -  AST 15 - 41 U/L - 21 -  ALT 0 - 44 U/L - 13 -   CBC Latest Ref Rng & Units 07/27/2019 06/27/2018 07/08/2016  WBC 3.4 - 10.8 x10E3/uL 3.0(L) 4.7 3.0(L)  Hemoglobin 11.1 - 15.9 g/dL 10.8(L) 10.3(L) 10.8(L)  Hematocrit 34.0 - 46.6 % 31.7(L) 31.8(L) 33.2(L)  Platelets 150 - 450 x10E3/uL 138(L) 133(L) 157   Lipid Panel     Component Value Date/Time   CHOL 191 10/25/2010 0538   TRIG 27 10/25/2010 0538   HDL 90 10/25/2010 0538   CHOLHDL 2.1 10/25/2010 0538   VLDL 5 10/25/2010 0538   LDLCALC 96 10/25/2010 0538   HEMOGLOBIN A1C No results found for: HGBA1C, MPG TSH No results for input(s): TSH in the last 8760 hours.  External labs   Hemoglobin 10.300 06/27/2018; INR 1.740 06/27/2018; Platelets 133.000 06/27/2018  Creatinine, Serum 1.230 06/27/2018 Potassium 3.600 06/27/2018 ALT (SGPT) 13.000  06/27/2018  Labs 11/25/18: Total cholesterol 145, triglycerides 48, HDL 77, LDL 54.  BUN 20, creatinine 1.04, EGFR 62 mL.  CMP otherwise normal.  Hb 10.5/HCT 32.4, platelets 117.  Medications and allergies  No Known Allergies   Current Outpatient Medications  Medication Instructions  . acetaminophen (TYLENOL) 650 mg, Oral, Every 6 hours PRN  . albuterol (PROVENTIL) 2.5 mg, Nebulization, Every 6 hours PRN  . atorvastatin (LIPITOR) 20 MG tablet TAKE 1 TABLET BY MOUTH EVERY DAY  . ferrous sulfate 325 mg, Oral, Daily  . losartan (COZAAR) 25 mg, Oral, Daily  . metoprolol succinate (TOPROL-XL) 50 mg, Oral, Daily, Take with or immediately following a meal.   .  PROAIR HFA 108 (90 Base) MCG/ACT inhaler 2 puffs, Inhalation, 4 times daily PRN  . RESTASIS 0.05 % ophthalmic emulsion 1 drop, Both Eyes, 2 times daily PRN  . triamterene-hydrochlorothiazide (MAXZIDE-25) 37.5-25 MG tablet TAKE 1 TABLET BY MOUTH EVERY DAY  . Vitamin D (Ergocalciferol) (DRISDOL) 50,000 Units, Oral, Every Wed  . XARELTO 20 MG TABS tablet TAKE 1 TABLET BY MOUTH EVERY DAY    Radiology:  No results found.  Cardiac Studies:   PFO closure 09/03/2016: Interventional data: Successful closure of the atrial septal defect under intracardiac echo guidance with implantation of a 25 millimeter septal occluder. Post-deployment double contrast study revealed no residual shunting.  Lexiscan Thalium stress 03/20/2017:  1. Resting EKG: NSR. Non diagnostic stress EKG due to pharmacologic stress.  2. Normal SPECT study with normal left ventricular systolic function. Low risk study.  Echocardiogram 07/15/2019:  Normal LV systolic function with visual EF 55-60%. No obvious regional  wall motion abnormalities. Left ventricle cavity is normal in size. Normal  wall thickness. Calculated EF 60%.  Left atrial cavity is mildly dilated. Interatrial septal occluder is in  good position without obvious residual shunting or thrombus.  Visually,  right atrial cavity appears mildly dilated.  Visually, right ventricle cavity appears mildly dilated. Grossly normal  right ventricular function.  Mild (Grade I) aortic regurgitation.  Moderate (Grade II) mitral regurgitation.  Mild to moderate tricuspid regurgitation.  Mild pulmonic regurgitation.  IVC is dilated with a respiratory response of >50%.  Prior study 11/18/2018: LVEF 17%, Grade 2 diastolic dysfunction, elevated  LAP, no significant change in the valvular heart disease.  EKG: EKG 07/02/2019: Atrial fibrillation with controlled ventricular response at 73 bpm, normal axis, nonspecific T wave abnormality.   Assessment   No diagnosis found.  No orders of the defined types were placed in this encounter.   There are no discontinued medications.   Recommendations:   Morgan Bowers  is a 74 y.o. with paroxysmal atrial fibrillation with history of cardioversion in 2018 maintaining sinus rhythm since, history of PFO closure in 2018, hypertension, hyperlipidemia, and pancytopenia.  She was noted to be in atrial fibrillation on 07/02/2019.  Echocardiogram obtained she now presents for follow-up of atrial fibrillation and also recent worsening dyspnea.  Due to persistent symptoms of dyspnea, she underwent direct-current cardioversion on 08/03/2019.  She feels her energy level has improved significantly since then, today she has regular rhythm on auscultation, EKG not performed due to epic being down. Patient is presently doing well, she is maintaining Sinus rhythm by auscultation.  Continue Xarelto. She has stage 3 CKD. Will change to 15 mg after supper for Xarelto.   No clinical evidence of congestive heart failure. Dyspnea is also related to underlying bronchial asthma and suspect she may have restrictive lung disease. Consider pulmonary evaluation. OV in 6 months.     [name], MD, [facility] 11/24/2019, 1:03 PM Office: [phone]

## 2019-12-30 ENCOUNTER — Other Ambulatory Visit: Payer: Self-pay | Admitting: Cardiology

## 2020-02-09 ENCOUNTER — Other Ambulatory Visit: Payer: Self-pay | Admitting: Cardiology

## 2020-02-18 ENCOUNTER — Other Ambulatory Visit: Payer: Self-pay | Admitting: Cardiology

## 2020-03-05 ENCOUNTER — Other Ambulatory Visit: Payer: Self-pay | Admitting: Cardiology

## 2020-03-14 ENCOUNTER — Other Ambulatory Visit: Payer: Self-pay | Admitting: Cardiology

## 2020-03-20 ENCOUNTER — Other Ambulatory Visit: Payer: Self-pay

## 2020-03-20 DIAGNOSIS — I480 Paroxysmal atrial fibrillation: Secondary | ICD-10-CM

## 2020-03-20 MED ORDER — RIVAROXABAN 15 MG PO TABS: 15.0000 mg | ORAL_TABLET | Freq: Every day | ORAL | 3 refills | Status: DC

## 2020-05-26 ENCOUNTER — Ambulatory Visit
Admission: RE | Admit: 2020-05-26 | Discharge: 2020-05-26 | Disposition: A | Discharge status: Home / Self Care | Payer: Medicaid Other | Source: Ambulatory Visit | Attending: Cardiology | Admitting: Cardiology

## 2020-05-26 ENCOUNTER — Encounter: Payer: Self-pay | Admitting: Cardiology

## 2020-05-26 ENCOUNTER — Other Ambulatory Visit: Payer: Self-pay

## 2020-05-26 ENCOUNTER — Ambulatory Visit: Payer: Medicaid Other | Admitting: Cardiology

## 2020-05-26 VITALS — BP 123/75 | HR 68 | Temp 97.6°F | Resp 16 | Ht 64.0 in | Wt 179.0 lb

## 2020-05-26 DIAGNOSIS — R0609 Other forms of dyspnea: Secondary | ICD-10-CM

## 2020-05-26 DIAGNOSIS — E7800 Pure hypercholesterolemia, unspecified: Secondary | ICD-10-CM

## 2020-05-26 DIAGNOSIS — R0600 Dyspnea, unspecified: Secondary | ICD-10-CM

## 2020-05-26 DIAGNOSIS — I480 Paroxysmal atrial fibrillation: Secondary | ICD-10-CM

## 2020-05-26 DIAGNOSIS — I10 Essential (primary) hypertension: Secondary | ICD-10-CM

## 2020-05-26 NOTE — Progress Notes (Signed)
Primary Physician/Referring:  Nolene Ebbs, MD  Patient ID: Morgan Bowers, female    DOB: 1945-06-26, 75 y.o.   MRN: [id number]  Chief Complaint  Patient presents with  . Atrial Fibrillation  . Hypertension  . Shortness of Breath  . Follow-up    6 month   HPI:    Morgan Bowers  is a 75 y.o. with h/o pancytopenia, negative GI work up in April 2019,  paroxysmal atrial fibrillation with history of cardioversion in 2018 maintaining sinus rhythm since, history of PFO closure in 2018, hypertension, hyperlipidemia, and pancytopenia.  She was noted to be in atrial fibrillation on 07/02/2019.  She also has chronic anemia.  She is maintaining sinus rhythm since cardioversion on 08/03/2019.  However she continues to have persistent dyspnea even with minimal activity.  No chest pain, no palpitations.  Fatigue symptoms have improved since cardioversion.  Accompanied by her daughter.  No bleeding diathesis on Xarelto.   Past Medical History:  Diagnosis Date  . A-fib ([place])   . Anemia   . Arthritis    "knees" (09/03/2016)  . GERD (gastroesophageal reflux disease)   . High cholesterol   . Hypertension    Past Surgical History:  Procedure Laterality Date  . CARDIOVERSION N/A 08/06/2016   Procedure: CARDIOVERSION;  Surgeon: [name], MD;  Location: [place];  Service: Cardiovascular;  Laterality: N/A;  . CARDIOVERSION N/A 08/03/2019   Procedure: CARDIOVERSION;  Surgeon: [name], MD;  Location: [place];  Service: Cardiovascular;  Laterality: N/A;  . COLONOSCOPY WITH PROPOFOL N/A 08/13/2017   Procedure: COLONOSCOPY WITH PROPOFOL;  Surgeon: Laurence Spates, MD;  Location: [place];  Service: Endoscopy;  Laterality: N/A;  . ESOPHAGOGASTRODUODENOSCOPY (EGD) WITH PROPOFOL N/A 08/13/2017   Procedure: ESOPHAGOGASTRODUODENOSCOPY (EGD) WITH PROPOFOL;  Surgeon: Laurence Spates, MD;  Location: [place];  Service: Endoscopy;  Laterality: N/A;  . IR GENERIC HISTORICAL  06/19/2016    IR VERTEBROPLASTY CERV/THOR BX INC UNI/BIL INC/INJECT/IMAGING 06/19/2016 Luanne Bras, MD [facility]  . IR GENERIC HISTORICAL  06/19/2016   IR VERTEBROPLASTY EA ADDL (T&LS) BX INC UNI/BIL INC INJECT/IMAGING 06/19/2016 Luanne Bras, MD [facility]  . PATENT FORAMEN OVALE CLOSURE  09/03/2016  . PATENT FORAMEN OVALE(PFO) CLOSURE N/A 09/03/2016   Procedure: Patent Forament [place](PFO) Closure;  Surgeon: [name], MD;  Location: [place] [facility];  Service: Cardiovascular;  Laterality: N/A;  . TEE WITHOUT CARDIOVERSION N/A 02/29/2016   Procedure: TRANSESOPHAGEAL ECHOCARDIOGRAM (TEE);  Surgeon: [name], MD;  Location: [place];  Service: Cardiovascular;  Laterality: N/A;  . TOE SURGERY Right 2020   Social History   Tobacco Use  . Smoking status: Never Smoker  . Smokeless tobacco: Never Used  Substance Use Topics  . Alcohol use: No    ROS  Review of Systems  Cardiovascular: Positive for dyspnea on exertion and leg swelling (mild and chronic). Negative for chest pain.  Respiratory: Positive for wheezing.   Gastrointestinal: Negative for melena.   Objective  Blood pressure 123/75, pulse 68, temperature 97.6 F (36.4 C), resp. rate 16, height 5' 4"  (1.626 m), weight 179 lb (81.2 kg), SpO2 96 %.  Vitals with BMI 05/26/2020 11/24/2019 09/22/2019  Height 5' 4"  5' 4"  5' 4"   Weight 179 lbs 173 lbs 10 oz 174 lbs  BMI 30.71 76.22 63.33  Systolic 545 625 -  Diastolic 75 77 -  Pulse 68 62 -    Physical Exam Vitals reviewed.  Cardiovascular:     Rate and Rhythm: Normal rate and  regular rhythm.     Pulses: Intact distal pulses.          Dorsalis pedis pulses are 2+ on the right side and 2+ on the left side.       Posterior tibial pulses are 2+ on the right side and 2+ on the left side.     Heart sounds: Murmur heard.   Midsystolic murmur is present with a grade of 2/6 at the apex.     Comments: Varicosities bilateral lower extremities.2+ bilateral pitting leg edema. No  JVD Pulmonary:     Effort: Pulmonary effort is normal. No accessory muscle usage or respiratory distress.     Breath sounds: Examination of the right-middle field reveals wheezing. Examination of the left-middle field reveals wheezing. Examination of the right-lower field reveals wheezing and rales. Examination of the left-lower field reveals wheezing and rales. Wheezing and rales present.  Abdominal:     General: Bowel sounds are normal.     Palpations: Abdomen is soft.    Laboratory examination:   Recent Labs    07/27/19 1027  NA 133*  K 4.4  CL 91*  CO2 31*  GLUCOSE 90  BUN 16  CREATININE 1.03*  CALCIUM 9.8  GFRNONAA 54*  GFRAA 62   CrCl cannot be calculated (Patient's most recent lab result is older than the maximum 21 days allowed.).  CMP Latest Ref Rng & Units 07/27/2019 06/27/2018 07/08/2016  Glucose 65 - 99 mg/dL 90 92 86  BUN 8 - 27 mg/dL 16 15 10   Creatinine 0.57 - 1.00 mg/dL 1.03(H) 1.23(H) 0.71  Sodium 134 - 144 mmol/L 133(L) 136 139  Potassium 3.5 - 5.2 mmol/L 4.4 3.6 4.5  Chloride 96 - 106 mmol/L 91(L) 96(L) 101  CO2 20 - 29 mmol/L 31(H) 31 31  Calcium 8.7 - 10.3 mg/dL 9.8 9.5 9.8  Total Protein 6.5 - 8.1 g/dL - 7.3 -  Total Bilirubin 0.3 - 1.2 mg/dL - 0.7 -  Alkaline Phos 38 - 126 U/L - 32(L) -  AST 15 - 41 U/L - 21 -  ALT 0 - 44 U/L - 13 -   CBC Latest Ref Rng & Units 07/27/2019 06/27/2018 07/08/2016  WBC 3.4 - 10.8 x10E3/uL 3.0(L) 4.7 3.0(L)  Hemoglobin 11.1 - 15.9 g/dL 10.8(L) 10.3(L) 10.8(L)  Hematocrit 34.0 - 46.6 % 31.7(L) 31.8(L) 33.2(L)  Platelets 150 - 450 x10E3/uL 138(L) 133(L) 157   Lipid Panel     Component Value Date/Time   CHOL 191 10/25/2010 0538   TRIG 27 10/25/2010 0538   HDL 90 10/25/2010 0538   CHOLHDL 2.1 10/25/2010 0538   VLDL 5 10/25/2010 0538   LDLCALC 96 10/25/2010 0538   HEMOGLOBIN A1C No results found for: HGBA1C, MPG TSH No results for input(s): TSH in the last 8760 hours.  External labs   Hemoglobin 10.300 06/27/2018;  INR 1.740 06/27/2018; Platelets 133.000 06/27/2018  Creatinine, Serum 1.230 06/27/2018 Potassium 3.600 06/27/2018 ALT (SGPT) 13.000 06/27/2018  Labs 11/25/18: Total cholesterol 145, triglycerides 48, HDL 77, LDL 54.  BUN 20, creatinine 1.04, EGFR 62 mL.  CMP otherwise normal.  Hb 10.5/HCT 32.4, platelets 117.  Medications and allergies  No Known Allergies  Current Outpatient Medications on File Prior to Visit  Medication Sig Dispense Refill  . acetaminophen (TYLENOL) 325 MG tablet Take 650 mg by mouth every 6 (six) hours as needed for moderate pain or headache.    . albuterol (PROVENTIL) (2.5 MG/3ML) 0.083% nebulizer solution Take 2.5 mg by nebulization every 6 (six)  hours as needed for wheezing or shortness of breath.    Marland Kitchen atorvastatin (LIPITOR) 20 MG tablet TAKE 1 TABLET BY MOUTH EVERY DAY 90 tablet 2  . ferrous sulfate 325 (65 FE) MG tablet Take 325 mg by mouth daily.    Marland Kitchen losartan (COZAAR) 25 MG tablet Take 25 mg by mouth daily.     . metoprolol succinate (TOPROL-XL) 50 MG 24 hr tablet TAKE 1 TABLET (50 MG TOTAL) BY MOUTH DAILY. TAKE WITH OR IMMEDIATELY FOLLOWING A MEAL. 90 tablet 3  . PROAIR HFA 108 (90 Base) MCG/ACT inhaler Inhale 2 puffs into the lungs 4 (four) times daily as needed for shortness of breath or wheezing.  2  . RESTASIS 0.05 % ophthalmic emulsion Place 1 drop into both eyes 2 (two) times daily as needed (dry eyes).     . Rivaroxaban (XARELTO) 15 MG TABS tablet Take 1 tablet (15 mg total) by mouth daily with supper. 90 tablet 3  . triamterene-hydrochlorothiazide (MAXZIDE-25) 37.5-25 MG tablet TAKE 1 TABLET BY MOUTH EVERY DAY 90 tablet 1  . Vitamin D, Ergocalciferol, (DRISDOL) 50000 units CAPS capsule Take 50,000 Units by mouth every Wednesday.     No current facility-administered medications on file prior to visit.     Radiology:  No results found.  Cardiac Studies:   PFO closure 09/03/2016: Interventional data: Successful closure of the atrial septal defect under  intracardiac echo guidance with implantation of a 25 millimeter septal occluder. Post-deployment double contrast study revealed no residual shunting.  Lexiscan Thalium stress 03/20/2017:  1. Resting EKG: NSR. Non diagnostic stress EKG due to pharmacologic stress.  2. Normal SPECT study with normal left ventricular systolic function. Low risk study.  Echocardiogram 07/15/2019:  Normal LV systolic function with visual EF 55-60%. No obvious regional  wall motion abnormalities. Left ventricle cavity is normal in size. Normal  wall thickness. Calculated EF 60%.  Left atrial cavity is mildly dilated. Interatrial septal occluder is in  good position without obvious residual shunting or thrombus.  Visually, right atrial cavity appears mildly dilated.  Visually, right ventricle cavity appears mildly dilated. Grossly normal  right ventricular function.  Mild (Grade I) aortic regurgitation.  Moderate (Grade II) mitral regurgitation.  Mild to moderate tricuspid regurgitation.  Mild pulmonic regurgitation.  IVC is dilated with a respiratory response of >50%.  Prior study 11/18/2018: LVEF 60%, Grade 2 diastolic dysfunction, elevated  LAP, no significant change in the valvular heart disease.  EKG:    EKG 05/26/2020: Sinus rhythm with first-degree AV block at rate of 68 bpm, left atrial enlargement, normal axis, poor R wave progression, probably normal variant.  LVH by voltage criteria.  Compared to 07/02/2019, atrial fibrillation no longer present.     Assessment     ICD-10-CM   1. Paroxysmal atrial fibrillation (HCC)  I48.0 CBC  2. Essential hypertension  I10 EKG 12-Lead    CMP14+EGFR    TSH  3. Dyspnea on exertion  R06.00 PCV ECHOCARDIOGRAM COMPLETE    Brain natriuretic peptide    DG Chest 2 View  4. Hypercholesteremia  E78.00 Lipid Panel With LDL/HDL Ratio   CHA2DS2-VASc Score is 4.  Yearly risk of stroke: 4.8% (A, F, HTN).  Score of 1=0.6; 2=2.2; 3=3.2; 4=4.8; 5=7.2; 6=9.8; 7=>9.8) -(CHF;  HTN; vasc disease DM,  Female = 1; Age <65 =0; 65-74 = 1,  >75 =2; stroke/embolism= 2).   No orders of the defined types were placed in this encounter.   There are no discontinued medications.  Recommendations:   Morgan Bowers  is a 75 y.o. with paroxysmal atrial fibrillation with history of cardioversion in 2018 maintaining sinus rhythm since, history of PFO closure in 2018, hypertension, hyperlipidemia, and pancytopenia.  She was noted to be in atrial fibrillation on 07/02/2019.  Echocardiogram obtained she now presents for follow-up of atrial fibrillation and also worsening dyspnea.  Due to persistent symptoms of dyspnea, no clinical evidence of congestive heart failure.  She does have 2+ bilateral pitting leg edema probably related to venous insufficiency and has mild varicose veins.  Dyspnea is also related to underlying bronchial asthma and suspect she may have restrictive lung disease.  I will obtain a chest x-ray PA and lateral view today.  I will also obtain a BNP and will order CMP, CBC and TSH as well.  We will repeat echocardiogram as she had shown mild right ventricular dilatation on prior echocardiogram a year ago.  She has had a negative nuclear stress test in 2018.   I would like to see her back in 6 weeks for follow-up.  Blood pressure is well controlled.  I will also check lipid profile testing to follow-up on lipids.  Consider pulmonary evaluation.     [name], MD, [facility] 05/26/2020, 11:07 AM Office: [phone]

## 2020-05-27 LAB — CMP14+EGFR
ALT: 17 IU/L (ref 0–32)
AST: 27 IU/L (ref 0–40)
Albumin/Globulin Ratio: 1.7 (ref 1.2–2.2)
Albumin: 4.5 g/dL (ref 3.7–4.7)
Alkaline Phosphatase: 37 IU/L — ABNORMAL LOW (ref 44–121)
BUN/Creatinine Ratio: 17 (ref 12–28)
BUN: 18 mg/dL (ref 8–27)
Bilirubin Total: 0.5 mg/dL (ref 0.0–1.2)
CO2: 33 mmol/L — ABNORMAL HIGH (ref 20–29)
Calcium: 9.6 mg/dL (ref 8.7–10.3)
Chloride: 95 mmol/L — ABNORMAL LOW (ref 96–106)
Creatinine, Ser: 1.08 mg/dL — ABNORMAL HIGH (ref 0.57–1.00)
GFR calc Af Amer: 58 mL/min/{1.73_m2} — ABNORMAL LOW (ref >59)
GFR calc non Af Amer: 51 mL/min/{1.73_m2} — ABNORMAL LOW (ref >59)
Globulin, Total: 2.7 g/dL (ref 1.5–4.5)
Glucose: 93 mg/dL (ref 65–99)
Potassium: 4.1 mmol/L (ref 3.5–5.2)
Sodium: 138 mmol/L (ref 134–144)
Total Protein: 7.2 g/dL (ref 6.0–8.5)

## 2020-05-27 LAB — CBC
Hematocrit: 31.7 % — ABNORMAL LOW (ref 34.0–46.6)
Hemoglobin: 10.6 g/dL — ABNORMAL LOW (ref 11.1–15.9)
MCH: 32.4 pg (ref 26.6–33.0)
MCHC: 33.4 g/dL (ref 31.5–35.7)
MCV: 97 fL (ref 79–97)
Platelets: 155 10*3/uL (ref 150–450)
RBC: 3.27 x10E6/uL — ABNORMAL LOW (ref 3.77–5.28)
RDW: 12 % (ref 11.7–15.4)
WBC: 3.7 10*3/uL (ref 3.4–10.8)

## 2020-05-27 LAB — LIPID PANEL WITH LDL/HDL RATIO
Cholesterol, Total: 165 mg/dL (ref 100–199)
HDL: 98 mg/dL (ref >39)
LDL Chol Calc (NIH): 59 mg/dL (ref 0–99)
LDL/HDL Ratio: 0.6 ratio (ref 0.0–3.2)
Triglycerides: 30 mg/dL (ref 0–149)
VLDL Cholesterol Cal: 8 mg/dL (ref 5–40)

## 2020-05-27 LAB — BRAIN NATRIURETIC PEPTIDE: BNP: 99.7 pg/mL (ref 0.0–100.0)

## 2020-05-27 LAB — TSH: TSH: 1.22 u[IU]/mL (ref 0.450–4.500)

## 2020-05-30 NOTE — Progress Notes (Signed)
Stable labs. I have forwarded to PCP also

## 2020-05-31 NOTE — Progress Notes (Signed)
Called and spoke with patient regarding her recent lab results.

## 2020-05-31 NOTE — Progress Notes (Signed)
Called and spoke with patient regarding her xray results.

## 2020-06-02 ENCOUNTER — Ambulatory Visit: Payer: Medicaid Other

## 2020-06-02 ENCOUNTER — Other Ambulatory Visit: Payer: Self-pay

## 2020-06-02 DIAGNOSIS — R0609 Other forms of dyspnea: Secondary | ICD-10-CM

## 2020-06-02 DIAGNOSIS — R0600 Dyspnea, unspecified: Secondary | ICD-10-CM

## 2020-06-22 ENCOUNTER — Other Ambulatory Visit: Payer: Self-pay | Admitting: Internal Medicine

## 2020-06-22 DIAGNOSIS — Z1231 Encounter for screening mammogram for malignant neoplasm of breast: Secondary | ICD-10-CM

## 2020-06-30 ENCOUNTER — Other Ambulatory Visit: Payer: Self-pay | Admitting: Cardiology

## 2020-07-17 ENCOUNTER — Encounter: Payer: Self-pay | Admitting: Cardiology

## 2020-07-17 ENCOUNTER — Other Ambulatory Visit: Payer: Self-pay

## 2020-07-17 ENCOUNTER — Ambulatory Visit: Payer: Medicaid Other | Admitting: Cardiology

## 2020-07-17 VITALS — BP 122/65 | HR 65 | Temp 97.9°F | Ht 64.0 in | Wt 173.2 lb

## 2020-07-17 DIAGNOSIS — I10 Essential (primary) hypertension: Secondary | ICD-10-CM

## 2020-07-17 DIAGNOSIS — I480 Paroxysmal atrial fibrillation: Secondary | ICD-10-CM

## 2020-07-17 DIAGNOSIS — R0609 Other forms of dyspnea: Secondary | ICD-10-CM

## 2020-07-17 DIAGNOSIS — R0600 Dyspnea, unspecified: Secondary | ICD-10-CM

## 2020-07-17 NOTE — Progress Notes (Signed)
 Primary Physician/Referring:  Avbuere, Edwin, MD  Patient ID: Morgan Bowers, female    DOB: 08/31/1945, 75 y.o.   MRN: [id number]  Chief Complaint  Patient presents with  . Paroxysmal atrial fibrillation   . Hypertension  . Shortness of Breath  . Follow-up   HPI:    Morgan Bowers  is a 75 y.o. with h/o pancytopenia, negative GI work up in April 2019,  paroxysmal atrial fibrillation with history of cardioversion in 2018 maintaining sinus rhythm since, history of PFO closure in 2018, hypertension, hyperlipidemia, and pancytopenia.  She was noted to be in atrial fibrillation on 07/02/2019.  Due to symptomatic AF ablation underwent direct-current cardioversion again on 08/03/2019 and when last seen on 05/26/2020 was still maintaining sinus rhythm.    Due to persistent dyspnea she now presents for follow-up and had routine labs, CXR and echo repeated. No new symptoms.  Fatigue symptoms have improved since cardioversion.  Accompanied by her daughter.  No bleeding diathesis on Xarelto.   Past Medical History:  Diagnosis Date  . A-fib (HCC)   . Anemia   . Arthritis    "knees" (09/03/2016)  . GERD (gastroesophageal reflux disease)   . High cholesterol   . Hypertension    Past Surgical History:  Procedure Laterality Date  . CARDIOVERSION N/A 08/06/2016   Procedure: CARDIOVERSION;  Surgeon: Jay Ganji, MD;  Location: [facility];  Service: Cardiovascular;  Laterality: N/A;  . CARDIOVERSION N/A 08/03/2019   Procedure: CARDIOVERSION;  Surgeon: Ganji, Jay, MD;  Location: [facility];  Service: Cardiovascular;  Laterality: N/A;  . COLONOSCOPY WITH PROPOFOL N/A 08/13/2017   Procedure: COLONOSCOPY WITH PROPOFOL;  Surgeon: Edwards, James, MD;  Location: [facility];  Service: Endoscopy;  Laterality: N/A;  . ESOPHAGOGASTRODUODENOSCOPY (EGD) WITH PROPOFOL N/A 08/13/2017   Procedure: ESOPHAGOGASTRODUODENOSCOPY (EGD) WITH PROPOFOL;  Surgeon: Edwards, James, MD;  Location: [facility];   Service: Endoscopy;  Laterality: N/A;  . IR GENERIC HISTORICAL  06/19/2016   IR VERTEBROPLASTY CERV/THOR BX INC UNI/BIL INC/INJECT/IMAGING 06/19/2016 Sanjeev Deveshwar, MD [facility]  . IR GENERIC HISTORICAL  06/19/2016   IR VERTEBROPLASTY EA ADDL (T&LS) BX INC UNI/BIL INC INJECT/IMAGING 06/19/2016 Sanjeev Deveshwar, MD [facility]  . PATENT FORAMEN OVALE CLOSURE  09/03/2016  . PATENT FORAMEN OVALE(PFO) CLOSURE N/A 09/03/2016   Procedure: Patent Forament [place](PFO) Closure;  Surgeon: Jay Ganji, MD;  [facility];  Service: Cardiovascular;  Laterality: N/A;  . TEE WITHOUT CARDIOVERSION N/A 02/29/2016   Procedure: TRANSESOPHAGEAL ECHOCARDIOGRAM (TEE);  Surgeon: Jay Ganji, MD;  [facility]: [facility];  Service: Cardiovascular;  Laterality: N/A;  . TOE SURGERY Right 2020   Social History   Tobacco Use  . Smoking status: Never Smoker  . Smokeless tobacco: Never Used  Substance Use Topics  . Alcohol use: No    ROS  Review of Systems  Cardiovascular: Positive for dyspnea on exertion and leg swelling (mild and chronic). Negative for chest pain.  Respiratory: Positive for wheezing.   Gastrointestinal: Negative for melena.   Objective  Blood pressure 122/65, pulse 65, temperature 97.9 F (36.6 C), height 5' 4" (1.626 m), weight 173 lb 3.2 oz (78.6 kg), SpO2 94 %.  Vitals with BMI 07/17/2020 05/26/2020 11/24/2019  Height 5' 4" 5' 4" 5' 4"  Weight 173 lbs 3 oz 179 lbs 173 lbs 10 oz  BMI 29.72 30.71 29.78  Systolic 122 123 135  Diastolic 65 75 77  Pulse 65 68 62    Physical Exam Vitals reviewed.  Cardiovascular:       Rate and Rhythm: Normal rate and regular rhythm.     Pulses: Intact distal pulses.          Dorsalis pedis pulses are 2+ on the right side and 2+ on the left side.       Posterior tibial pulses are 2+ on the right side and 2+ on the left side.     Heart sounds: Murmur heard.   Midsystolic murmur is present with a grade of 2/6 at the apex.     Comments:  Varicosities bilateral lower extremities.2+ bilateral pitting leg edema. No JVD Pulmonary:     Effort: Pulmonary effort is normal. No accessory muscle usage or respiratory distress.     Breath sounds: Examination of the right-middle field reveals wheezing. Examination of the left-middle field reveals wheezing. Examination of the right-lower field reveals wheezing and rales. Examination of the left-lower field reveals wheezing and rales. Wheezing and rales present.  Abdominal:     General: Bowel sounds are normal.     Palpations: Abdomen is soft.    Laboratory examination:   Recent Labs    07/27/19 1027 05/26/20 1200  NA 133* 138  K 4.4 4.1  CL 91* 95*  CO2 31* 33*  GLUCOSE 90 93  BUN 16 18  CREATININE 1.03* 1.08*  CALCIUM 9.8 9.6  GFRNONAA 54* 51*  GFRAA 62 58*   CrCl cannot be calculated (Patient's most recent lab result is older than the maximum 21 days allowed.).  CMP Latest Ref Rng & Units 05/26/2020 07/27/2019 06/27/2018  Glucose 65 - 99 mg/dL 93 90 92  BUN 8 - 27 mg/dL 18 16 15  Creatinine 0.57 - 1.00 mg/dL 1.08(H) 1.03(H) 1.23(H)  Sodium 134 - 144 mmol/L 138 133(L) 136  Potassium 3.5 - 5.2 mmol/L 4.1 4.4 3.6  Chloride 96 - 106 mmol/L 95(L) 91(L) 96(L)  CO2 20 - 29 mmol/L 33(H) 31(H) 31  Calcium 8.7 - 10.3 mg/dL 9.6 9.8 9.5  Total Protein 6.0 - 8.5 g/dL 7.2 - 7.3  Total Bilirubin 0.0 - 1.2 mg/dL 0.5 - 0.7  Alkaline Phos 44 - 121 IU/L 37(L) - 32(L)  AST 0 - 40 IU/L 27 - 21  ALT 0 - 32 IU/L 17 - 13   CBC Latest Ref Rng & Units 05/26/2020 07/27/2019 06/27/2018  WBC 3.4 - 10.8 x10E3/uL 3.7 3.0(L) 4.7  Hemoglobin 11.1 - 15.9 g/dL 10.6(L) 10.8(L) 10.3(L)  Hematocrit 34.0 - 46.6 % 31.7(L) 31.7(L) 31.8(L)  Platelets 150 - 450 x10E3/uL 155 138(L) 133(L)   Lipid Panel Recent Labs    05/26/20 1201  CHOL 165  TRIG 30  LDLCALC 59  HDL 98    HEMOGLOBIN A1C No results found for: HGBA1C, MPG TSH Recent Labs    05/26/20 1201  TSH 1.220    External labs     Hemoglobin 10.300 06/27/2018; INR 1.740 06/27/2018; Platelets 133.000 06/27/2018  Creatinine, Serum 1.230 06/27/2018 Potassium 3.600 06/27/2018 ALT (SGPT) 13.000 06/27/2018  Labs 11/25/18: Total cholesterol 145, triglycerides 48, HDL 77, LDL 54.  BUN 20, creatinine 1.04, EGFR 62 mL.  CMP otherwise normal.  Hb 10.5/HCT 32.4, platelets 117.  Medications and allergies  No Known Allergies  Current Outpatient Medications on File Prior to Visit  Medication Sig Dispense Refill  . acetaminophen (TYLENOL) 325 MG tablet Take 650 mg by mouth every 6 (six) hours as needed for moderate pain or headache.    . albuterol (PROVENTIL) (2.5 MG/3ML) 0.083% nebulizer solution Take 2.5 mg by nebulization every 6 (six) hours as   needed for wheezing or shortness of breath.    Marland Kitchen atorvastatin (LIPITOR) 20 MG tablet TAKE 1 TABLET BY MOUTH EVERY DAY 90 tablet 2  . ferrous sulfate 325 (65 FE) MG tablet Take 325 mg by mouth daily.    Marland Kitchen losartan (COZAAR) 25 MG tablet Take 25 mg by mouth daily.     . meclizine (ANTIVERT) 25 MG tablet Take 25 mg by mouth every 8 (eight) hours as needed.    . metoprolol succinate (TOPROL-XL) 50 MG 24 hr tablet TAKE 1 TABLET (50 MG TOTAL) BY MOUTH DAILY. TAKE WITH OR IMMEDIATELY FOLLOWING A MEAL. 90 tablet 3  . PROAIR HFA 108 (90 Base) MCG/ACT inhaler Inhale 2 puffs into the lungs 4 (four) times daily as needed for shortness of breath or wheezing.  2  . RESTASIS 0.05 % ophthalmic emulsion Place 1 drop into both eyes 2 (two) times daily as needed (dry eyes).     . Rivaroxaban (XARELTO) 15 MG TABS tablet Take 1 tablet (15 mg total) by mouth daily with supper. 90 tablet 3  . triamterene-hydrochlorothiazide (MAXZIDE-25) 37.5-25 MG tablet TAKE 1 TABLET BY MOUTH EVERY DAY 90 tablet 1  . Vitamin D, Ergocalciferol, (DRISDOL) 50000 units CAPS capsule Take 50,000 Units by mouth every Wednesday.     No current facility-administered medications on file prior to visit.     Radiology:   Chest x-ray PA  and lateral view 05/26/2020: Normal heart size. Aortic tortuosity. No pleural effusion or edema. No airspace consolidation. Three multiple compression deformities are identified within the upper and midthoracic spine. Two of these levels have been treated with bone cement.  Cardiac Studies:   PFO closure 09/03/2016: Interventional data: Successful closure of the atrial septal defect under intracardiac echo guidance with implantation of a 25 millimeter septal occluder. Post-deployment double contrast study revealed no residual shunting.  Lexiscan Thalium stress 03/20/2017:  1. Resting EKG: NSR. Non diagnostic stress EKG due to pharmacologic stress.  2. Normal SPECT study with normal left ventricular systolic function. Low risk study.  Echocardiogram 06/02/2020: Normal LV systolic function with visual EF 60-65%. Left ventricle cavity is normal in size. Normal global wall motion. Indeterminate diastolic filling pattern, elevated LAP. Calculated EF 65%. Mild (Grade I) mitral regurgitation. Mild tricuspid regurgitation. Interatrial septal occluder is in good position without obvious residual shunting or thrombus. Compared to prior study dated 07/15/2019 no significant change.   EKG:    EKG 07/17/2020: Sinus rhythm with first-degree AV block at rate of 64 bpm, left atrial enlargement, nonspecific T abnormality, cannot exclude anterior ischemia.  Poor R wave progression, cannot exclude anteroseptal infarct old.  Normal QT interval.   No significant change from EKG 05/26/2020.  Compared to 07/02/2019, atrial fibrillation no longer present.     Assessment     ICD-10-CM   1. Dyspnea on exertion  R06.00 Ambulatory referral to Pulmonology  2. Paroxysmal atrial fibrillation (HCC)  I48.0 EKG 12-Lead  3. Essential hypertension  I10    CHA2DS2-VASc Score is 4.  Yearly risk of stroke: 4.8% (A, F, HTN).  Score of 1=0.6; 2=2.2; 3=3.2; 4=4.8; 5=7.2; 6=9.8; 7=>9.8) -(CHF; HTN; vasc disease DM,  Female =  1; Age <65 =0; 65-74 = 1,  >75 =2; stroke/embolism= 2).   No orders of the defined types were placed in this encounter.   There are no discontinued medications.   Recommendations:   Morgan Bowers  is a 75 y.o. with h/o pancytopenia, negative GI work up in April 2019,  paroxysmal  atrial fibrillation with history of cardioversion in 2018 maintaining sinus rhythm since, history of PFO closure in 2018, hypertension, hyperlipidemia, and pancytopenia.  She was noted to be in atrial fibrillation on 07/02/2019.  Due to symptomatic AF ablation underwent direct-current cardioversion again on 08/03/2019 and when last seen on 05/26/2020 was still maintaining sinus rhythm.    Reviewed results of recent performed labs including CMP, CBC and also lipid profile testing, although she has chronic mild anemia at this is remained stable and renal function also has remained stable.  Echocardiogram no change in chest x-ray is unyielding.  Suspect her dyspnea is related to deconditioning or underlying bronchial asthma.  She may benefit from pulmonary evaluation due to persistent symptoms. She has had a negative nuclear stress test in 2018.   Due to persistent symptoms of dyspnea, consider pulmonary evaluation.  I also hear bilateral expiratory rhonchi and crackles, she may need HRCT to exclude pulmonary fibrosis. From cardiac standpoint I will see her back in 6 months.  She is tolerating anticoagulation without bleeding diathesis.    [name], MD, [facility] 07/17/2020, 1:02 PM Office: [phone]

## 2020-07-26 ENCOUNTER — Other Ambulatory Visit: Payer: Self-pay | Admitting: Cardiology

## 2020-08-11 ENCOUNTER — Encounter: Payer: Self-pay | Admitting: Cardiology

## 2020-08-14 ENCOUNTER — Inpatient Hospital Stay: Admission: RE | Admit: 2020-08-14 | Payer: Medicaid Other | Source: Ambulatory Visit

## 2020-08-15 ENCOUNTER — Other Ambulatory Visit: Payer: Self-pay

## 2020-08-15 ENCOUNTER — Ambulatory Visit
Admission: RE | Admit: 2020-08-15 | Discharge: 2020-08-15 | Disposition: A | Discharge status: Home / Self Care | Payer: Medicaid Other | Source: Ambulatory Visit | Attending: Internal Medicine | Admitting: Internal Medicine

## 2020-08-15 DIAGNOSIS — Z1231 Encounter for screening mammogram for malignant neoplasm of breast: Secondary | ICD-10-CM

## 2020-09-30 ENCOUNTER — Other Ambulatory Visit: Payer: Self-pay | Admitting: Cardiology

## 2020-09-30 DIAGNOSIS — I4891 Unspecified atrial fibrillation: Secondary | ICD-10-CM

## 2020-11-08 ENCOUNTER — Other Ambulatory Visit: Payer: Self-pay | Admitting: Cardiology

## 2021-01-02 ENCOUNTER — Other Ambulatory Visit: Payer: Self-pay | Admitting: Cardiology

## 2021-01-17 ENCOUNTER — Ambulatory Visit: Payer: Medicaid Other | Admitting: Cardiology

## 2021-01-19 ENCOUNTER — Other Ambulatory Visit: Payer: Self-pay

## 2021-01-19 ENCOUNTER — Ambulatory Visit: Payer: Medicaid Other | Admitting: Cardiology

## 2021-01-19 ENCOUNTER — Encounter: Payer: Self-pay | Admitting: Cardiology

## 2021-01-19 VITALS — BP 137/87 | HR 62 | Temp 97.6°F | Resp 17 | Ht 64.0 in | Wt 176.6 lb

## 2021-01-19 DIAGNOSIS — R0600 Dyspnea, unspecified: Secondary | ICD-10-CM

## 2021-01-19 DIAGNOSIS — I4891 Unspecified atrial fibrillation: Secondary | ICD-10-CM

## 2021-01-19 DIAGNOSIS — N1832 Chronic kidney disease, stage 3b: Secondary | ICD-10-CM

## 2021-01-19 DIAGNOSIS — R0609 Other forms of dyspnea: Secondary | ICD-10-CM

## 2021-01-19 DIAGNOSIS — I10 Essential (primary) hypertension: Secondary | ICD-10-CM

## 2021-01-19 NOTE — Progress Notes (Signed)
Primary Physician/Referring:  Nolene Ebbs, MD  Patient ID: Morgan Bowers, female    DOB: 1946-04-28, 75 y.o.   MRN: [id number]  Chief Complaint  Patient presents with   Atrial Fibrillation   Hypertension   Follow-up    6 month   HPI:    Morgan Bowers  is a 75 y.o. with h/o pancytopenia, negative GI work up in April 2019,  paroxysmal atrial fibrillation with history of cardioversion in 2018, history of PFO closure in 2018, hypertension, hyperlipidemia, and pancytopenia.  She was noted to be in atrial fibrillation on 07/02/2019.  Due to symptomatic AF ablation underwent direct-current cardioversion again on 08/03/2019 and when last seen on 05/26/2020 was still maintaining sinus rhythm.    She presents here for 43-monthoffice visit, her main complaint is dyspnea on exertion, same complaint that she had on her prior visit 6 months ago.  She has occasional sharp pain in the chest unrelated to exertion that last a few seconds.  She is accompanied by her son.  No bleeding diathesis on Xarelto.   Past Medical History:  Diagnosis Date   A-fib ([place]    Anemia    Arthritis    "knees" (09/03/2016)   GERD (gastroesophageal reflux disease)    High cholesterol    Hypertension    Past Surgical History:  Procedure Laterality Date   CARDIOVERSION N/A 08/06/2016   Procedure: CARDIOVERSION;  Surgeon: JAdrian Prows MD;  [facility];  Service: Cardiovascular;  Laterality: N/A;   CARDIOVERSION N/A 08/03/2019   Procedure: CARDIOVERSION;  Surgeon: GAdrian Prows MD;  [place]  Service: Cardiovascular;  Laterality: N/A;   COLONOSCOPY WITH PROPOFOL N/A 08/13/2017   Procedure: COLONOSCOPY WITH PROPOFOL;  Surgeon: ELaurence Spates MD;  [place] [facility] [place]  Service: Endoscopy;  Laterality: N/A;   ESOPHAGOGASTRODUODENOSCOPY (EGD) WITH PROPOFOL N/A 08/13/2017   Procedure: ESOPHAGOGASTRODUODENOSCOPY (EGD) WITH PROPOFOL;  Surgeon: ELaurence Spates MD;  [place] [facility]   Service: Endoscopy;  Laterality: N/A;   IR GENERIC HISTORICAL  06/19/2016   IR VERTEBROPLASTY CERV/THOR BX INC UNI/BIL INC/INJECT/IMAGING 06/19/2016 SLuanne Bras MD [facility]   IR GENERIC HISTORICAL  06/19/2016   IR VERTEBROPLASTY EA ADDL (T&LS) BX INC UNI/BIL INC INJECT/IMAGING 06/19/2016 SLuanne Bras MD [facility]   PATENT FORAMEN OVALE CLOSURE  09/03/2016   PATENT FORAMEN OVALE(PFO) CLOSURE N/A 09/03/2016   Procedure: Patent Forament [place](PFO) Closure;  Surgeon: JAdrian Prows MD;  [facility];  Service: Cardiovascular;  Laterality: N/A;   TEE WITHOUT CARDIOVERSION N/A 02/29/2016   Procedure: TRANSESOPHAGEAL ECHOCARDIOGRAM (TEE);  Surgeon: JAdrian Prows MD;  [place]  Service: Cardiovascular;  Laterality: N/A;   TOE SURGERY Right 2020   Social History   Tobacco Use   Smoking status: Never   Smokeless tobacco: Never  Substance Use Topics   Alcohol use: No    ROS  Review of Systems  Cardiovascular:  Positive for dyspnea on exertion and leg swelling (mild and chronic). Negative for chest pain.  Respiratory:  Positive for wheezing.   Gastrointestinal:  Negative for melena.  Objective  Blood pressure 137/87, pulse 62, temperature 97.6 F (36.4 C), temperature source Temporal, resp. rate 17, height _0  (1.626 m), weight 176 lb 9.6 oz (80.1 kg), SpO2 94 %.  Vitals with BMI 01/19/2021 07/17/2020 05/26/2020  Height _1  _2  _3   Weight 176 lbs 10 oz 173 lbs 3 oz 179 lbs  BMI 30.3 [phone] Systolic [id number]  Diastolic 87 65 75  Pulse 62 65 68    Physical Exam Vitals reviewed.  Neck:     Vascular: No carotid bruit or JVD.  Cardiovascular:     Rate and Rhythm: Normal rate. Rhythm irregular.     Pulses: Intact distal pulses.          Dorsalis pedis pulses are 2+ on the right side and 2+ on the left side.       Posterior tibial pulses are 2+ on the right side and 2+ on the left side.     Heart sounds: Murmur heard.  Midsystolic murmur  is present with a grade of 2/6 at the apex.  Pulmonary:     Effort: Pulmonary effort is normal. No accessory muscle usage or respiratory distress.     Breath sounds: Examination of the right-middle field reveals wheezing. Examination of the left-middle field reveals wheezing. Examination of the right-lower field reveals wheezing and rales. Examination of the left-lower field reveals wheezing and rales. Wheezing and rales present.  Abdominal:     General: Bowel sounds are normal.     Palpations: Abdomen is soft.  Musculoskeletal:        General: Swelling (Varicosities bilateral lower extremities.1+ bilateral pitting leg edema.) present.   Laboratory examination:   Recent Labs    05/26/20 1200  NA 138  K 4.1  CL 95*  CO2 33*  GLUCOSE 93  BUN 18  CREATININE 1.08*  CALCIUM 9.6  GFRNONAA 51*  GFRAA 58*   CrCl cannot be calculated (Patient's most recent lab result is older than the maximum 21 days allowed.).  CMP Latest Ref Rng & Units 05/26/2020 07/27/2019 06/27/2018  Glucose 65 - 99 mg/dL 93 90 92  BUN 8 - 27 mg/dL _0 Creatinine 0.57 - 1.00 mg/dL 1.08(H) 1.03(H) 1.23(H)  Sodium 134 - 144 mmol/L 138 133(L) 136  Potassium 3.5 - 5.2 mmol/L 4.1 4.4 3.6  Chloride 96 - 106 mmol/L 95(L) 91(L) 96(L)  CO2 20 - 29 mmol/L 33(H) 31(H) 31  Calcium 8.7 - 10.3 mg/dL 9.6 9.8 9.5  Total Protein 6.0 - 8.5 g/dL 7.2 - 7.3  Total Bilirubin 0.0 - 1.2 mg/dL 0.5 - 0.7  Alkaline Phos 44 - 121 IU/L 37(L) - 32(L)  AST 0 - 40 IU/L 27 - 21  ALT 0 - 32 IU/L 17 - 13   CBC Latest Ref Rng & Units 05/26/2020 07/27/2019 06/27/2018  WBC 3.4 - 10.8 x10E3/uL 3.7 3.0(L) 4.7  Hemoglobin 11.1 - 15.9 g/dL 10.6(L) 10.8(L) 10.3(L)  Hematocrit 34.0 - 46.6 % 31.7(L) 31.7(L) 31.8(L)  Platelets 150 - 450 x10E3/uL 155 138(L) 133(L)   Lipid Panel Recent Labs    05/26/20 1201  CHOL 165  TRIG 30  LDLCALC 59  HDL 98    HEMOGLOBIN A1C No results found for: HGBA1C, MPG TSH Recent Labs    05/26/20 1201  TSH  1.220    External labs    Hemoglobin 10.300 06/27/2018; INR 1.740 06/27/2018; Platelets 133.000 06/27/2018  Creatinine, Serum 1.230 06/27/2018 Potassium 3.600 06/27/2018 ALT (SGPT) 13.000 06/27/2018  Labs 11/25/18: Total cholesterol 145, triglycerides 48, HDL 77, LDL 54.  BUN 20, creatinine 1.04, EGFR 62 mL.  CMP otherwise normal.  Hb 10.5/HCT 32.4, platelets 117.  Medications and allergies  No Known Allergies  Current Outpatient Medications on File Prior to Visit  Medication Sig Dispense Refill   acetaminophen (TYLENOL) 325 MG tablet Take 650 mg by mouth every 6 (six) hours as needed for  moderate pain or headache.     albuterol (PROVENTIL) (2.5 MG/3ML) 0.083% nebulizer solution Take 2.5 mg by nebulization every 6 (six) hours as needed for wheezing or shortness of breath.     atorvastatin (LIPITOR) 20 MG tablet TAKE 1 TABLET BY MOUTH EVERY DAY 90 tablet 2   ferrous sulfate 325 (65 FE) MG tablet Take 325 mg by mouth daily.     losartan (COZAAR) 25 MG tablet Take 25 mg by mouth daily.      meclizine (ANTIVERT) 25 MG tablet Take 25 mg by mouth every 8 (eight) hours as needed.     metoprolol succinate (TOPROL-XL) 50 MG 24 hr tablet TAKE 1 TABLET (50 MG TOTAL) BY MOUTH DAILY. TAKE WITH OR IMMEDIATELY FOLLOWING A MEAL. 90 tablet 3   montelukast (SINGULAIR) 10 MG tablet Take 10 mg by mouth at bedtime.     PROAIR HFA 108 (90 Base) MCG/ACT inhaler Inhale 2 puffs into the lungs 4 (four) times daily as needed for shortness of breath or wheezing.  2   RESTASIS 0.05 % ophthalmic emulsion Place 1 drop into both eyes 2 (two) times daily as needed (dry eyes).      Rivaroxaban (XARELTO) 15 MG TABS tablet Take 1 tablet (15 mg total) by mouth daily with supper. 90 tablet 3   triamterene-hydrochlorothiazide (MAXZIDE-25) 37.5-25 MG tablet TAKE 1 TABLET BY MOUTH EVERY DAY 90 tablet 1   Vitamin D, Ergocalciferol, (DRISDOL) 50000 units CAPS capsule Take 50,000 Units by mouth every Wednesday.     No current  facility-administered medications on file prior to visit.     Radiology:   Chest x-ray PA and lateral view 05/26/2020: Normal heart size. Aortic tortuosity. No pleural effusion or edema. No airspace consolidation. Three multiple compression deformities are identified within the upper and midthoracic spine. Two of these levels have been treated with bone cement.  Cardiac Studies:   PFO closure 09/03/2016: Interventional data: Successful closure of the atrial septal defect under intracardiac echo guidance with implantation of a 25 millimeter septal occluder. Post-deployment double contrast study revealed no residual shunting.  Lexiscan Thalium stress 03/20/2017:  1. Resting EKG: NSR. Non diagnostic stress EKG due to pharmacologic stress.  2. Normal SPECT study with normal left ventricular systolic function. Low risk study.  Echocardiogram 06/02/2020: Normal LV systolic function with visual EF 60-65%. Left ventricle cavity is normal in size. Normal global wall motion. Indeterminate diastolic filling pattern, elevated LAP. Calculated EF 65%. Mild (Grade I) mitral regurgitation. Mild tricuspid regurgitation. Interatrial septal occluder is in good position without obvious residual shunting or thrombus. Compared to prior study dated 07/15/2019 no significant change.   EKG:  EKG 01/19/2021: Atrial fibrillation with controlled ventricular response at rate of 63 bpm, normal axis.  Poor R progression, probably normal variant.  No evidence of ischemia, normal QT interval.  Compared to 07/17/2020, atrial fibrillation has replaced sinus rhythm with first-degree AV block.   Assessment     ICD-10-CM   1. Paroxysmal atrial fibrillation  I48.91     2. Essential hypertension  I10 EKG 12-Lead    3. Dyspnea on exertion  R06.00 Ambulatory referral to Pulmonology    4. Stage 3b chronic kidney disease (HCC)  N18.32      CHA2DS2-VASc Score is 4.  Yearly risk of stroke: 4.8% (A, F, HTN).  Score of 1=0.6;  2=2.2; 3=3.2; 4=4.8; 5=7.2; 6=9.8; 7=>9.8) -(CHF; HTN; vasc disease DM,  Female = 1; Age <65 =0; 65-74 = 1,  >75 =2; stroke/embolism= 2).  No orders of the defined types were placed in this encounter.   There are no discontinued medications.   Recommendations:   Morgan Bowers  is a 76 y.o. with h/o pancytopenia, negative GI work up in April 2019,  paroxysmal atrial fibrillation with history of cardioversion in 2018, history of PFO closure in 2018, hypertension, hyperlipidemia, and pancytopenia.  She was noted to be in atrial fibrillation on 07/02/2019.  Due to symptomatic AF ablation underwent direct-current cardioversion again on 08/03/2019 and when last seen on 05/26/2020 was still maintaining sinus rhythm.    She presents here for 45-monthoffice visit, her main complaint is dyspnea on exertion, same complaint that she had on her prior visit 6 months ago.  She has occasional sharp pain in the chest unrelated to exertion that last a few seconds.  Suspect her dyspnea is related to deconditioning or underlying bronchial asthma.  She may benefit from pulmonary evaluation due to persistent symptoms. She has had a negative nuclear stress test in 2018.  Although she is back in atrial fibrillation, she has not had any further fatigue and no change in symptoms since 6 months ago when she was in sinus rhythm.  Hence suspect that we could consider rate control strategy only.  She is tolerating anticoagulation without bleeding diathesis.  Advised her to watch out for any worsening fatigue like she had previously with onset of atrial fibrillation or any symptoms of leg swelling or PND or orthopnea.  Referral sent to pulmonary medicine.  Due to persistent symptoms of dyspnea, consider pulmonary evaluation.  I also hear bilateral expiratory rhonchi and crackles, she may need HRCT to exclude pulmonary fibrosis. Blood pressure is well controlled, external labs reviewed, hemoglobin has remained stable with  stable renal function as well.  Office visit in 1 year or sooner if problems.   JAdrian Prows MD, [facility]9/01/2021, 10:34 AM Office: [phone]

## 2021-03-23 ENCOUNTER — Other Ambulatory Visit: Payer: Self-pay | Admitting: Cardiology

## 2021-03-23 DIAGNOSIS — I480 Paroxysmal atrial fibrillation: Secondary | ICD-10-CM

## 2021-04-02 ENCOUNTER — Other Ambulatory Visit: Payer: Self-pay | Admitting: Cardiology

## 2021-05-24 ENCOUNTER — Ambulatory Visit (INDEPENDENT_AMBULATORY_CARE_PROVIDER_SITE_OTHER): Payer: Medicaid Other | Admitting: Internal Medicine

## 2021-05-24 ENCOUNTER — Encounter: Payer: Self-pay | Admitting: Internal Medicine

## 2021-05-24 ENCOUNTER — Other Ambulatory Visit: Payer: Self-pay

## 2021-05-24 VITALS — BP 112/68 | HR 82 | Temp 98.0°F | Ht 65.0 in | Wt 173.4 lb

## 2021-05-24 DIAGNOSIS — J4530 Mild persistent asthma, uncomplicated: Secondary | ICD-10-CM | POA: Diagnosis not present

## 2021-05-24 MED ORDER — FLUTICASONE FUROATE-VILANTEROL 100-25 MCG/ACT IN AEPB: 1.0000 | INHALATION_SPRAY | Freq: Every day | RESPIRATORY_TRACT | 3 refills | Status: DC

## 2021-05-24 NOTE — Progress Notes (Signed)
Morgan Bowers    [id number]    12/11/1945  Primary Care Physician:Avbuere, Christean Grief, MD  Referring Physician: [name], MD [place],  [place] [address] Reason for Consultation: dyspnea on exertion Date of Consultation: 05/24/2021  Chief complaint:   Chief Complaint  Patient presents with   Consult    DOE     HPI: Morgan Bowers is a 76 y.o. woman who presents for new patient evaluation of dyspnea at the request of Dr. Einar Gip.  The patient speaks a little english, daughter is here to provide history.   Having shortness of breath with exertion for the past few months maybe about a year.   She has dyspnea on exertion - sometimes helped by inhaler therapy. Symptoms come and go, almost always with exertion, never at rest. Takes albuterol MDI and breathing less than once/month.   No coughing, but she does have wheezing sometimes.   Never had pneumonia or bronchitis. No childhood respiratory disease  Denies chest pain or palpitations.   No fevers chills night sweats or weight loss.    Social history: She is originally from [place]. [place] the city - has been in [place] a long time.  They did have an outdoor wood burning stove in [place] in childhood, when she moved to the city had a gas stove.  Smoking history: never smoker  Social History   Occupational History   Occupation: retired  Tobacco Use   Smoking status: Never   Smokeless tobacco: Never  Vaping Use   Vaping Use: Never used  Substance and Sexual Activity   Alcohol use: No   Drug use: No   Sexual activity: Never    Relevant family history:  Family History  Problem Relation Age of Onset   Lung disease Neg Hx     Past Medical History:  Diagnosis Date   A-fib ([place])    Anemia    Arthritis    "knees" (09/03/2016)   GERD (gastroesophageal reflux disease)    High cholesterol    Hypertension     Past  Surgical History:  Procedure Laterality Date   CARDIOVERSION N/A 08/06/2016   Procedure: CARDIOVERSION;  Surgeon: [name], MD;  Location: [place];  Service: Cardiovascular;  Laterality: N/A;   CARDIOVERSION N/A 08/03/2019   Procedure: CARDIOVERSION;  Surgeon: [name], MD;  Location: [place];  Service: Cardiovascular;  Laterality: N/A;   COLONOSCOPY WITH PROPOFOL N/A 08/13/2017   Procedure: COLONOSCOPY WITH PROPOFOL;  Surgeon: Laurence Spates, MD;  Location: [place];  Service: Endoscopy;  Laterality: N/A;   ESOPHAGOGASTRODUODENOSCOPY (EGD) WITH PROPOFOL N/A 08/13/2017   Procedure: ESOPHAGOGASTRODUODENOSCOPY (EGD) WITH PROPOFOL;  Surgeon: Laurence Spates, MD;  Location: [place];  Service: Endoscopy;  Laterality: N/A;   IR GENERIC HISTORICAL  06/19/2016   IR VERTEBROPLASTY CERV/THOR BX INC UNI/BIL INC/INJECT/IMAGING 06/19/2016 Luanne Bras, MD [facility]   IR GENERIC HISTORICAL  06/19/2016   IR VERTEBROPLASTY EA ADDL (T&LS) BX INC UNI/BIL INC INJECT/IMAGING 06/19/2016 Luanne Bras, MD [facility]   PATENT FORAMEN OVALE CLOSURE  09/03/2016   PATENT FORAMEN OVALE(PFO) CLOSURE N/A 09/03/2016   Procedure: Patent Forament [place](PFO) Closure;  Surgeon: [name], MD;  Location: [place] [facility];  Service: Cardiovascular;  Laterality: N/A;   TEE WITHOUT CARDIOVERSION N/A 02/29/2016   Procedure: TRANSESOPHAGEAL ECHOCARDIOGRAM (TEE);  Surgeon: [name], MD;  Location: [place];  Service: Cardiovascular;  Laterality: N/A;   TOE SURGERY Right 2020     Physical Exam: Blood pressure 112/68, pulse 82, temperature 98 F (36.7 C), temperature source Oral, height 5\' 5"  (1.651 m), weight 173 lb 6.4 oz (78.7 kg), SpO2 96 %. Gen:      No acute distress ENT:  no nasal polyps, mucus membranes moist Lungs:    No increased respiratory effort, symmetric chest wall excursion, clear to auscultation bilaterally, no wheezes or crackles CV:         irregularly irregular  No pedal  edema Abd:      + bowel sounds; soft, non-tender; no distension MSK: no acute synovitis of DIP or PIP joints, no mechanics hands.  Skin:      Warm and dry; no rashes Neuro: normal speech, no focal facial asymmetry Psych: alert and oriented x3, normal mood and affect   Data Reviewed/Medical Decision Making:  Independent interpretation of tests: Imaging:  Review of patient's chest xray Jan 2022 images revealed kyphosis, DJD s/p cementing, no acute cardiopulmonary process. The patient's images have been independently reviewed by me.    Echo Jan 0177 Normal LV systolic function with visual EF 60-65%. Left ventricle cavity  is normal in size. Normal global wall motion. Indeterminate diastolic  filling pattern, elevated LAP. Calculated EF 65%.  Mild (Grade I) mitral regurgitation.  Mild tricuspid regurgitation.  Interatrial septal occluder is in good position without obvious residual  shunting or thrombus.   PFTs:  No flowsheet data found.  Labs:  Lab Results  Component Value Date   WBC 3.7 05/26/2020   HGB 10.6 (L) 05/26/2020   HCT 31.7 (L) 05/26/2020   MCV 97 05/26/2020   PLT 155 05/26/2020   Lab Results  Component Value Date   NA 138 05/26/2020   K 4.1 05/26/2020   CL 95 (L) 05/26/2020   CO2 33 (H) 05/26/2020     Immunization status:  Immunization History  Administered Date(s) Administered   Pneumococcal Polysaccharide-23 12/02/2017   Zoster Recombinat (Shingrix) 12/02/2017     I reviewed prior external note(s) from cardiology  I reviewed the result(s) of the labs and imaging as noted above.   I have ordered PFT  Assessment:  Shortness of breath  Plan/Recommendations: Possible asthma. Will trial ICS-LABA with 1 puff once a day.  Continue prn albuterol Pfts prior to next visit    Return to Care: Return in about 6 weeks (around 07/05/2021).  Lenice Llamas, MD [facility] [place]  CC: [name],  MD

## 2021-05-24 NOTE — Patient Instructions (Addendum)
Please schedule follow up scheduled with myself in 6 weeks.  If my schedule is not open yet, we will contact you with a reminder closer to that time. Please call [phone] if you haven't heard from [place] a month before.   Before your next visit I would like you to have: Spirometry pre and post bronchodilator (before next visit) and FENO  Start Breo 1 puff once a day. Gargle after use. Take this every day even if you feel good.   Continue albuterol inhaler or breathing treatments as needed.

## 2021-07-06 ENCOUNTER — Other Ambulatory Visit: Payer: Self-pay

## 2021-07-06 ENCOUNTER — Ambulatory Visit (INDEPENDENT_AMBULATORY_CARE_PROVIDER_SITE_OTHER): Payer: Medicaid Other | Admitting: Internal Medicine

## 2021-07-06 ENCOUNTER — Encounter: Payer: Self-pay | Admitting: Internal Medicine

## 2021-07-06 ENCOUNTER — Other Ambulatory Visit: Payer: Self-pay | Admitting: Internal Medicine

## 2021-07-06 VITALS — BP 126/80 | HR 67 | Ht 63.0 in | Wt 172.6 lb

## 2021-07-06 DIAGNOSIS — J4530 Mild persistent asthma, uncomplicated: Secondary | ICD-10-CM

## 2021-07-06 LAB — PULMONARY FUNCTION TEST
FEF 25-75 Post: 0.3 L/sec
FEF 25-75 Pre: 0.23 L/sec
FEF2575-%Change-Post: 31 %
FEF2575-%Pred-Post: 21 %
FEF2575-%Pred-Pre: 16 %
FEV1-%Change-Post: 5 %
FEV1-%Pred-Post: 31 %
FEV1-%Pred-Pre: 29 %
FEV1-Post: 0.5 L
FEV1-Pre: 0.47 L
FEV1FVC-%Change-Post: 9 %
FEV1FVC-%Pred-Pre: 83 %
FEV6-%Change-Post: 0 %
FEV6-%Pred-Post: 36 %
FEV6-%Pred-Pre: 36 %
FEV6-Post: 0.72 L
FEV6-Pre: 0.72 L
FEV6FVC-%Change-Post: 2 %
FEV6FVC-%Pred-Post: 104 %
FEV6FVC-%Pred-Pre: 101 %
FVC-%Change-Post: -3 %
FVC-%Pred-Post: 34 %
FVC-%Pred-Pre: 36 %
FVC-Post: 0.72 L
FVC-Pre: 0.74 L
Post FEV1/FVC ratio: 69 %
Post FEV6/FVC ratio: 100 %
Pre FEV1/FVC ratio: 64 %
Pre FEV6/FVC Ratio: 97 %

## 2021-07-06 MED ORDER — FLUTICASONE FUROATE-VILANTEROL 100-25 MCG/ACT IN AEPB: 1.0000 | INHALATION_SPRAY | Freq: Every day | RESPIRATORY_TRACT | 11 refills | Status: DC

## 2021-07-06 NOTE — Progress Notes (Signed)
pft  

## 2021-07-06 NOTE — Progress Notes (Signed)
Morgan Bowers    [id number]    May 14, 1945  Primary Care Physician:Avbuere, Christean Grief, MD Date of Appointment: 07/06/2021 Established Patient Visit  Chief complaint:   Chief Complaint  Patient presents with   Follow-up    Pft review      HPI: Morgan Bowers is a 76 y.o. woman with mild persistent asthma.  Interval Updates: Here for follow up after starting Breo and after obtaining PFTs. They show severe airflow limitation. She was unable to do DLCO and lung volumes.    Feeling much better with breo. Only used albuterol once. No thrush   Current Regimen:Breo, prn albuterol Asthma Triggers: smoke, exertion Exacerbations in the last year: none requiring prednisone History of hospitalization or intubation: never Allergy Testing: none GERD: denies Allergic Rhinitis: denies ACT:  Asthma Control Test ACT Total Score  07/06/2021 22   FeNO:  Originally from [place] and did have an outdoor woodburning stove  I have reviewed the patient's family social and past medical history and updated as appropriate.   Past Medical History:  Diagnosis Date   A-fib ([place])    Anemia    Arthritis    "knees" (09/03/2016)   GERD (gastroesophageal reflux disease)    High cholesterol    Hypertension     Past Surgical History:  Procedure Laterality Date   CARDIOVERSION N/A 08/06/2016   Procedure: CARDIOVERSION;  Surgeon: [name], MD;  Location: [facility] ENDOSCOPY;  Service: Cardiovascular;  Laterality: N/A;   CARDIOVERSION N/A 08/03/2019   Procedure: CARDIOVERSION;  Surgeon: [name], MD;  Location: [place];  Service: Cardiovascular;  Laterality: N/A;   COLONOSCOPY WITH PROPOFOL N/A 08/13/2017   Procedure: COLONOSCOPY WITH PROPOFOL;  Surgeon: Laurence Spates, MD;  Location: [place];  Service: Endoscopy;  Laterality: N/A;   ESOPHAGOGASTRODUODENOSCOPY (EGD) WITH PROPOFOL N/A 08/13/2017   Procedure: ESOPHAGOGASTRODUODENOSCOPY (EGD) WITH PROPOFOL;  Surgeon: Laurence Spates, MD;  Location: [place];  Service: Endoscopy;  Laterality: N/A;   IR GENERIC HISTORICAL  06/19/2016   IR VERTEBROPLASTY CERV/THOR BX INC UNI/BIL INC/INJECT/IMAGING 06/19/2016 Luanne Bras, MD [facility]   IR GENERIC HISTORICAL  06/19/2016   IR VERTEBROPLASTY EA ADDL (T&LS) BX INC UNI/BIL INC INJECT/IMAGING 06/19/2016 Luanne Bras, MD [facility]   PATENT FORAMEN OVALE CLOSURE  09/03/2016   PATENT FORAMEN OVALE(PFO) CLOSURE N/A 09/03/2016   Procedure: Patent [place](PFO) Closure;  Surgeon: [name], MD;  Location: [place] CV LAB;  Service: Cardiovascular;  Laterality: N/A;   TEE WITHOUT CARDIOVERSION N/A 02/29/2016   Procedure: TRANSESOPHAGEAL ECHOCARDIOGRAM (TEE);  Surgeon: [name], MD;  Location: [facility] ENDOSCOPY;  Service: Cardiovascular;  Laterality: N/A;   TOE SURGERY Right 2020    Family History  Problem Relation Age of Onset   Lung disease Neg Hx     Social History   Occupational History   Occupation: retired  Tobacco Use   Smoking status: Never   Smokeless tobacco: Never  Vaping Use   Vaping Use: Never used  Substance and Sexual Activity   Alcohol use: No   Drug use: No   Sexual activity: Never     Physical Exam: Blood pressure 126/80, pulse 67, height 5\' 3"  (1.6 m), weight 172 lb 9.6 oz (78.3 kg), SpO2 98 %.  Gen:      No acute distress ENT:  no thrush, no nasal polyps, mucus membranes moist Lungs:    No increased respiratory effort, symmetric chest wall excursion, clear to auscultation bilaterally, no wheezes or  crackles CV:         Regular rate and rhythm; no murmurs, rubs, or gallops.  No pedal edema   Data Reviewed: Imaging: I have personally reviewed the chst xray Jan 2022 - no acute process  PFTs:  PFT Results Latest Ref Rng & Units 07/06/2021  FVC-Pre L 0.74  FVC-Predicted Pre % 36  FVC-Post L 0.72  FVC-Predicted Post % 34  Pre FEV1/FVC % % 64  Post FEV1/FCV % % 69  FEV1-Pre L 0.47  FEV1-Predicted Pre % 29  FEV1-Post L  0.50   I have personally reviewed the patient's PFTs and show severe airflow limitation FEV1 29% of predicted  Labs: Lab Results  Component Value Date   WBC 3.7 05/26/2020   HGB 10.6 (L) 05/26/2020   HCT 31.7 (L) 05/26/2020   MCV 97 05/26/2020   PLT 155 05/26/2020   Lab Results  Component Value Date   NA 138 05/26/2020   K 4.1 05/26/2020   CL 95 (L) 05/26/2020   CO2 33 (H) 05/26/2020     Immunization status: Immunization History  Administered Date(s) Administered   Pneumococcal Polysaccharide-23 12/02/2017   Zoster Recombinat (Shingrix) 12/02/2017    External Records Personally Reviewed: cardiology, pcp  Assessment:  Mild persistent asthma with severe airflow limitation FEV1 29% - improved control  Plan/Recommendations: Continue prn albuterol Continue Breo   Return to Care: Return in about 1 year (around 07/06/2022).   Lenice Llamas, MD Pulmonary and [place]

## 2021-07-06 NOTE — Progress Notes (Signed)
Spirometry pre and post done today. 

## 2021-07-06 NOTE — Patient Instructions (Signed)
Please schedule follow up scheduled with myself in 12 months.  If my schedule is not open yet, we will contact you with a reminder closer to that time. Please call [phone] if you haven't heard from [place] a month before.   Conitnue Breo 1 puff once a day.   Continue albuterol inhaler as needed.

## 2021-07-20 ENCOUNTER — Ambulatory Visit: Payer: Medicaid Other | Admitting: Cardiology

## 2021-07-20 ENCOUNTER — Other Ambulatory Visit: Payer: Self-pay

## 2021-07-20 ENCOUNTER — Encounter: Payer: Self-pay | Admitting: Cardiology

## 2021-07-20 VITALS — BP 126/81 | HR 55 | Temp 97.9°F | Resp 16 | Ht 63.0 in | Wt 176.4 lb

## 2021-07-20 DIAGNOSIS — I4891 Unspecified atrial fibrillation: Secondary | ICD-10-CM

## 2021-07-20 DIAGNOSIS — R0609 Other forms of dyspnea: Secondary | ICD-10-CM

## 2021-07-20 DIAGNOSIS — I10 Essential (primary) hypertension: Secondary | ICD-10-CM

## 2021-07-20 NOTE — Progress Notes (Signed)
Primary Physician/Referring:  Nolene Ebbs, MD  Patient ID: Morgan Bowers, female    DOB: [birth year], 76 y.o.   MRN: [id number]  Chief Complaint  Patient presents with   Atrial Fibrillation   Hypertension   Shortness of Breath   Follow-up    6 months   HPI:    Morgan Bowers  is a 76 y.o. with h/o pancytopenia, negative GI work up in April 2019,  paroxysmal atrial fibrillation with history of cardioversion in 2018, history of PFO closure in 2018, hypertension, hyperlipidemia, and pancytopenia.  She was noted to be in atrial fibrillation on 07/02/2019.  Due to symptomatic AF ablation underwent direct-current cardioversion again on 08/03/2019 and when last seen on 05/26/2020 was still maintaining sinus rhythm.    She presents here for 41-monthoffice visit, on her last office visit I referred her for pulmonary consultation and since then has been on bronchodilator therapy with excellent response and resolution of dyspnea.  She remains asymptomatic presently.  Past Medical History:  Diagnosis Date   [place]    Anemia    Arthritis    "knees" (09/03/2016)   Asthma    GERD (gastroesophageal reflux disease)    High cholesterol    Hypertension     Social History   Tobacco Use   Smoking status: Never   Smokeless tobacco: Never  Substance Use Topics   Alcohol use: No   ROS  Review of Systems  Cardiovascular:  Positive for leg swelling (mild and chronic). Negative for chest pain and dyspnea on exertion.  Respiratory:  Negative for wheezing.   Gastrointestinal:  Negative for melena.  Objective  Blood pressure 126/81, pulse (!) 55, temperature 97.9 F (36.6 C), temperature source Temporal, resp. rate 16, height '5\' 3"'$  (1.6 m), weight 176 lb 6.4 oz (80 kg), SpO2 96 %.  Vitals with BMI 07/20/2021 07/06/2021 05/24/2021  Height '5\' 3"'$  '5\' 3"'$  '5\' 5"'$   Weight 176 lbs 6 oz 172 lbs 10 oz 173 lbs 6 oz  BMI 31.26 [id number] Systolic [id number] Diastolic 81 80 68  Pulse 55  67 82    Physical Exam Neck:     Vascular: No carotid bruit or JVD.  Cardiovascular:     Rate and Rhythm: Normal rate and regular rhythm.     Pulses: Intact distal pulses.          Dorsalis pedis pulses are 2+ on the right side and 2+ on the left side.       Posterior tibial pulses are 2+ on the right side and 2+ on the left side.     Heart sounds: Murmur heard.  Midsystolic murmur is present with a grade of 2/6 at the apex.  Pulmonary:     Effort: Pulmonary effort is normal.     Breath sounds: Normal breath sounds.  Abdominal:     General: Bowel sounds are normal.     Palpations: Abdomen is soft.  Musculoskeletal:        General: Swelling (Varicosities bilateral lower extremities.1+ bilateral pitting leg edema.) present.   Laboratory examination:   External labs   Cholesterol, total 165.000 m 05/26/2020 HDL 98.000 mg 05/26/2020 LDL 59.000 mg 05/26/2020 Triglycerides 30.000 mg 05/26/2020  Hemoglobin 10.600 G/ 05/26/2020 Platelets 155.000 x1 05/26/2020  Creatinine, Serum 1.080 mg/ 05/26/2020 Potassium 4.100 mm 05/26/2020 ALT (SGPT) 17.000 IU/ 05/26/2020  TSH 1.220 05/26/2020 BNP 99.700 pg/ 05/26/2020   Medications and allergies  No Known Allergies  Current Outpatient Medications:    acetaminophen (TYLENOL) 325 MG tablet, Take 650 mg by mouth every 6 (six) hours as needed for moderate pain or headache., Disp: , Rfl:    albuterol (PROVENTIL) (2.5 MG/3ML) 0.083% nebulizer solution, Take 2.5 mg by nebulization every 6 (six) hours as needed for wheezing or shortness of breath., Disp: , Rfl:    atorvastatin (LIPITOR) 20 MG tablet, TAKE 1 TABLET BY MOUTH EVERY DAY, Disp: 90 tablet, Rfl: 2   ferrous sulfate 325 (65 FE) MG tablet, Take 325 mg by mouth daily., Disp: , Rfl:    fluticasone furoate-vilanterol (BREO ELLIPTA) 100-25 MCG/ACT AEPB, Inhale 1 puff into the lungs daily., Disp: 1 each, Rfl: 11   losartan (COZAAR) 25 MG tablet, Take 25 mg by mouth daily. , Disp: , Rfl:     meclizine (ANTIVERT) 25 MG tablet, Take 25 mg by mouth every 8 (eight) hours as needed., Disp: , Rfl:    metoprolol succinate (TOPROL-XL) 50 MG 24 hr tablet, TAKE 1 TABLET (50 MG TOTAL) BY MOUTH DAILY. TAKE WITH OR IMMEDIATELY FOLLOWING A MEAL., Disp: 90 tablet, Rfl: 3   montelukast (SINGULAIR) 10 MG tablet, Take 10 mg by mouth at bedtime., Disp: , Rfl:    PROAIR HFA 108 (90 Base) MCG/ACT inhaler, Inhale 2 puffs into the lungs 4 (four) times daily as needed for shortness of breath or wheezing., Disp: , Rfl: 2   RESTASIS 0.05 % ophthalmic emulsion, Place 1 drop into both eyes 2 (two) times daily as needed (dry eyes)., Disp: , Rfl:    triamterene-hydrochlorothiazide (MAXZIDE-25) 37.5-25 MG tablet, TAKE 1 TABLET BY MOUTH EVERY DAY, Disp: 90 tablet, Rfl: 1   Vitamin D, Ergocalciferol, (DRISDOL) 50000 units CAPS capsule, Take 50,000 Units by mouth every Wednesday., Disp: , Rfl:    XARELTO 15 MG TABS tablet, TAKE 1 TABLET (15 MG TOTAL) BY MOUTH DAILY WITH SUPPER., Disp: 90 tablet, Rfl: 3    Radiology:   Chest x-ray PA and lateral view 05/26/2020: Normal heart size. Aortic tortuosity. No pleural effusion or edema. No airspace consolidation. Three multiple compression deformities are identified within the upper and midthoracic spine. Two of these levels have been treated with bone cement.  Cardiac Studies:   PFO closure 09/03/2016: Interventional data: Successful closure of the atrial septal defect under intracardiac echo guidance with implantation of a 25 millimeter septal occluder. Post-deployment double contrast study revealed no residual shunting.  Lexiscan Thalium stress 03/20/2017:  1. Resting EKG: NSR. Non diagnostic stress EKG due to pharmacologic stress.  2. Normal SPECT study with normal left ventricular systolic function. Low risk study.  Echocardiogram 06/02/2020: Normal LV systolic function with visual EF 60-65%. Left ventricle cavity is normal in size. Normal global wall motion.  Indeterminate diastolic filling pattern, elevated LAP. Calculated EF 65%. Mild (Grade I) mitral regurgitation. Mild tricuspid regurgitation. Interatrial septal occluder is in good position without obvious residual shunting or thrombus. Compared to prior study dated 07/15/2019 no significant change.   EKG:  EKG 07/20/2021: Normal sinus rhythm at rate of 63 bpm, poor R wave progression, probably normal variant.  Normal QT interval.  EKG 01/19/2021: Atrial fibrillation with controlled ventricular response at rate of 63 bpm, normal axis.  Poor R progression, probably normal variant.  No evidence of ischemia, normal QT interval.  Compared to 07/17/2020, atrial fibrillation has replaced sinus rhythm with first-degree AV block.   Assessment     ICD-10-CM   1. Atrial fibrillation with controlled ventricular response (HCC)  I48.91 EKG 12-Lead  2. Essential hypertension  I10     3. Dyspnea on exertion  R06.09      CHA2DS2-VASc Score is 4.  Yearly risk of stroke: 4.8% (A, F, HTN).  Score of 1=0.6; 2=2.2; 3=3.2; 4=4.8; 5=7.2; 6=9.8; 7=>9.8) -(CHF; HTN; vasc disease DM,  Female = 1; Age <65 =0; 65-74 = 1,  >75 =2; stroke/embolism= 2).   No orders of the defined types were placed in this encounter.   There are no discontinued medications.   Recommendations:   Ori Kreiter  is a 76 y.o. with h/o pancytopenia, negative GI work up in April 2019,  paroxysmal atrial fibrillation with history of cardioversion in 2018, history of PFO closure in 2018, hypertension, hyperlipidemia, and pancytopenia.  She was noted to be in atrial fibrillation on 07/02/2019, S/P direct-current cardioversion again on 08/03/2019 and maintaining sinus rhythm.  On her last office visit 6 months ago, she was back in A-fib but was asymptomatic but is now back into regular rhythm today.  She presents here for 25-monthoffice visit, her main complaint is dyspnea on exertion and wheezing and since being on bronchodilators, she is  feeling well and remains asymptomatic. Daughter present.  She is tolerating anticoagulation without bleeding diathesis.  Advised her to watch out for any worsening fatigue like she had previously with onset of atrial fibrillation or any symptoms of leg swelling or PND or orthopnea.    Blood pressure is well controlled. Office visit in 1 year or sooner if problems.   JAdrian Prows MD, FMid [facility]3/03/2022, 10:30 AM Office: [phone]

## 2021-07-21 ENCOUNTER — Encounter: Payer: Self-pay | Admitting: Cardiology

## 2021-08-10 ENCOUNTER — Other Ambulatory Visit: Payer: Self-pay | Admitting: Cardiology

## 2021-08-13 ENCOUNTER — Other Ambulatory Visit: Payer: Self-pay | Admitting: Internal Medicine

## 2021-08-13 DIAGNOSIS — Z1231 Encounter for screening mammogram for malignant neoplasm of breast: Secondary | ICD-10-CM

## 2021-08-17 ENCOUNTER — Ambulatory Visit
Admission: RE | Admit: 2021-08-17 | Discharge: 2021-08-17 | Disposition: A | Discharge status: Home / Self Care | Payer: Medicaid Other | Source: Ambulatory Visit | Attending: Internal Medicine | Admitting: Internal Medicine

## 2021-08-17 DIAGNOSIS — Z1231 Encounter for screening mammogram for malignant neoplasm of breast: Secondary | ICD-10-CM

## 2021-08-21 ENCOUNTER — Other Ambulatory Visit: Payer: Self-pay | Admitting: Internal Medicine

## 2021-08-21 DIAGNOSIS — R928 Other abnormal and inconclusive findings on diagnostic imaging of breast: Secondary | ICD-10-CM

## 2021-09-04 ENCOUNTER — Other Ambulatory Visit: Payer: Medicaid Other

## 2021-09-25 ENCOUNTER — Other Ambulatory Visit: Payer: Self-pay | Admitting: Cardiology

## 2021-09-25 DIAGNOSIS — I4891 Unspecified atrial fibrillation: Secondary | ICD-10-CM

## 2021-10-17 ENCOUNTER — Ambulatory Visit: Payer: Medicaid Other

## 2021-10-17 ENCOUNTER — Ambulatory Visit
Admission: RE | Admit: 2021-10-17 | Discharge: 2021-10-17 | Disposition: A | Discharge status: Home / Self Care | Payer: Medicaid Other | Source: Ambulatory Visit | Attending: Internal Medicine | Admitting: Internal Medicine

## 2021-10-17 DIAGNOSIS — R928 Other abnormal and inconclusive findings on diagnostic imaging of breast: Secondary | ICD-10-CM

## 2021-12-18 ENCOUNTER — Ambulatory Visit (INDEPENDENT_AMBULATORY_CARE_PROVIDER_SITE_OTHER): Payer: Medicaid Other | Admitting: Physician Assistant

## 2021-12-18 ENCOUNTER — Encounter: Payer: Self-pay | Admitting: Physician Assistant

## 2021-12-18 ENCOUNTER — Ambulatory Visit (INDEPENDENT_AMBULATORY_CARE_PROVIDER_SITE_OTHER): Payer: Medicaid Other

## 2021-12-18 DIAGNOSIS — M25571 Pain in right ankle and joints of right foot: Secondary | ICD-10-CM

## 2021-12-18 NOTE — Progress Notes (Signed)
Office Visit Note   Patient: Morgan Bowers           Date of Birth: Nov 16, 1945           MRN: [id number] Visit Date: 12/18/2021              Requested by: Nolene Ebbs, MD [address] [place],  [place] [address] PCP: Nolene Ebbs, MD   Assessment & Plan: Visit Diagnoses:  1. Pain in right ankle and joints of right foot   2. Sinus tarsi syndrome of right ankle     Plan:  Given patient's pain over the sinus Tarsi region recommended sinus Tarsi injection.  Patient and her daughter were agreeable.  Postinjection patient had no pain lateral aspect of the ankle with rising from sitting position.  The ankle was wrapped with an Ace bandage which she will remove prior to returning to bed this evening.  She will follow-up with [place] as needed.  I explained to patient's daughter that she could have injections as often as every 3 months.  Did discuss with her wearing better supportive shoes like it is sneaker with medial arch support.  Follow-Up Instructions: Return if symptoms worsen or fail to improve.   Orders:  Orders Placed This Encounter  Procedures   XR Ankle Complete Right   No orders of the defined types were placed in this encounter.     Procedures: No procedures performed   Clinical Data: No additional findings.   Subjective: Chief Complaint  Patient presents with   Right Ankle - Pain    HPI Patient is a 76 year old female who comes in today with right ankle pain has been ongoing for the past month no injury.  She is from [place] speaks some English her daughter accompanies her to today and speaks perfect English.  She notes swelling lateral aspect ankle and pain.  She has had no treatment.  Review of Systems See HPI otherwise negative or noncontributory.  Objective: Vital Signs: There were no vitals taken for this visit.  Physical Exam Constitutional:      Appearance: She is not ill-appearing or diaphoretic.  Pulmonary:     Effort: Pulmonary  effort is normal.  Neurological:     Mental Status: She is alert and oriented to person, place, and time.  Psychiatric:        Mood and Affect: Mood normal.     Ortho Exam Bilateral feet pes planus.  She is able to do a single heel raise bilaterally.  Global tenderness bilateral ankles maximal tenderness right ankle over the sinus Tarsi region.  She has 5 out of 5 strength with external and internal rotation against resistance bilateral feet.  No tenderness over the Achilles bilaterally.  Achilles are intact bilaterally.  Full dorsiflexion plantarflexion bilateral ankles.  Right foot pain with attempts of range of motion to the subtalar joint. Specialty Comments:  No specialty comments available.  Imaging: XR Ankle Complete Right  Result Date: 12/18/2021 Right ankle 3 views: Talus well located within the ankle mortise no diastases.  Ankle joint overall well-maintained.  Talonavicular joint on the lateral view shows significant arthritic changes with beaking off the dorsal aspect of the talus.  No other acute findings or fractures noted.    PMFS History: Patient Active Problem List   Diagnosis Date Noted   Unilateral primary osteoarthritis, right knee 09/22/2019   Unilateral primary osteoarthritis, left knee 09/22/2019   Paroxysmal atrial fibrillation ([place]). CHA2DS2-VASc Score is 3.  Yearly risk of  stroke: 3.2% (A, F, HTN).   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)    Anemia    Arthritis    "knees" (09/03/2016)   Asthma    GERD  (gastroesophageal reflux disease)    High cholesterol    Hypertension     Family History  Problem Relation Age of Onset   Lung disease Neg Hx     Past Surgical History:  Procedure Laterality Date   CARDIOVERSION N/A 08/06/2016   Procedure: CARDIOVERSION;  Surgeon: [name], MD;  Location: [facility];  Service: Cardiovascular;  Laterality: N/A;   CARDIOVERSION N/A 08/03/2019   Procedure: CARDIOVERSION;  Surgeon: [name], MD;  Location: [facility];  Service: Cardiovascular;  Laterality: N/A;   COLONOSCOPY WITH PROPOFOL N/A 08/13/2017   Procedure: COLONOSCOPY WITH PROPOFOL;  Surgeon: Laurence Spates, MD;  Location: [facility] ENDOSCOPY;  Service: Endoscopy;  Laterality: N/A;   ESOPHAGOGASTRODUODENOSCOPY (EGD) WITH PROPOFOL N/A 08/13/2017   Procedure: ESOPHAGOGASTRODUODENOSCOPY (EGD) WITH PROPOFOL;  Surgeon: Laurence Spates, MD;  Location: [facility];  Service: Endoscopy;  Laterality: N/A;   IR GENERIC HISTORICAL  06/19/2016   IR VERTEBROPLASTY CERV/THOR BX INC UNI/BIL INC/INJECT/IMAGING 06/19/2016 Luanne Bras, MD [facility]   IR GENERIC HISTORICAL  06/19/2016   IR VERTEBROPLASTY EA ADDL (T&LS) BX INC UNI/BIL INC INJECT/IMAGING 06/19/2016 Luanne Bras, MD [facility]   PATENT FORAMEN OVALE CLOSURE  09/03/2016   PATENT FORAMEN OVALE(PFO) CLOSURE N/A 09/03/2016   Procedure: Patent [place](PFO) Closure;  Surgeon: [name], MD;  Location: [facility] CV LAB;  Service: Cardiovascular;  Laterality: N/A;   TEE WITHOUT CARDIOVERSION N/A 02/29/2016   Procedure: TRANSESOPHAGEAL ECHOCARDIOGRAM (TEE);  Surgeon: [name], MD;  Location: [facility];  Service: Cardiovascular;  Laterality: N/A;   TOE SURGERY Right 2020   Social History   Occupational History   Occupation: retired  Tobacco Use   Smoking status: Never   Smokeless tobacco: Never  Vaping Use   Vaping Use: Never used  Substance and Sexual Activity   Alcohol use: No   Drug use: No   Sexual activity: Never

## 2021-12-31 ENCOUNTER — Other Ambulatory Visit: Payer: Self-pay | Admitting: Cardiology

## 2022-02-19 ENCOUNTER — Other Ambulatory Visit: Payer: Self-pay | Admitting: Cardiology

## 2022-02-19 DIAGNOSIS — I480 Paroxysmal atrial fibrillation: Secondary | ICD-10-CM

## 2022-04-01 ENCOUNTER — Other Ambulatory Visit: Payer: Self-pay | Admitting: Cardiology

## 2022-05-14 ENCOUNTER — Other Ambulatory Visit: Payer: Self-pay | Admitting: Cardiology

## 2022-07-18 ENCOUNTER — Encounter: Payer: Self-pay | Admitting: Radiology

## 2022-07-22 ENCOUNTER — Ambulatory Visit: Payer: Medicaid Other | Admitting: Cardiology

## 2022-07-22 ENCOUNTER — Encounter: Payer: Self-pay | Admitting: Cardiology

## 2022-07-22 VITALS — BP 169/102 | HR 68 | Resp 16 | Ht 63.0 in | Wt 174.0 lb

## 2022-07-22 DIAGNOSIS — N1832 Chronic kidney disease, stage 3b: Secondary | ICD-10-CM

## 2022-07-22 DIAGNOSIS — I5033 Acute on chronic diastolic (congestive) heart failure: Secondary | ICD-10-CM

## 2022-07-22 DIAGNOSIS — I10 Essential (primary) hypertension: Secondary | ICD-10-CM

## 2022-07-22 DIAGNOSIS — R600 Localized edema: Secondary | ICD-10-CM

## 2022-07-22 DIAGNOSIS — I4819 Other persistent atrial fibrillation: Secondary | ICD-10-CM

## 2022-07-22 MED ORDER — AMLODIPINE BESYLATE 5 MG PO TABS: 5.0000 mg | ORAL_TABLET | ORAL | 2 refills | Status: DC

## 2022-07-22 MED ORDER — FUROSEMIDE 20 MG PO TABS: 20.0000 mg | ORAL_TABLET | ORAL | 3 refills | Status: DC

## 2022-07-22 MED ORDER — POTASSIUM CHLORIDE ER 10 MEQ PO TBCR: 10.0000 meq | EXTENDED_RELEASE_TABLET | Freq: Every day | ORAL | 3 refills | Status: AC

## 2022-07-22 NOTE — Progress Notes (Signed)
Primary Physician/Referring:  Nolene Ebbs, MD  Patient ID: Morgan Bowers, female    DOB: 05/08/1946, 77 y.o.   MRN: [id number]  Chief Complaint  Patient presents with   Atrial fibrillation with controlled ventricular response (H   Follow-up   HPI:    Morgan Bowers  is a 77 y.o. with h/o mild pancytopenia, negative GI work up in April 2019,  paroxysmal atrial fibrillation with history of cardioversion in 2018, history of PFO closure in 2018, hypertension, hyperlipidemia presents for annual visit.  He has noticed worsening leg edema and also dyspnea over the past several months.  Denies chest pain or palpitations.  Daughter present.   Past Medical History:  Diagnosis Date   A-fib ([place])    Anemia    Arthritis    "knees" (09/03/2016)   Asthma    GERD (gastroesophageal reflux disease)    High cholesterol    Hypertension     Social History   Tobacco Use   Smoking status: Never   Smokeless tobacco: Never  Substance Use Topics   Alcohol use: No   ROS  Review of Systems  Cardiovascular:  Positive for dyspnea on exertion and leg swelling. Negative for chest pain.  Respiratory:  Negative for wheezing.   Gastrointestinal:  Negative for melena.   Objective  Blood pressure (!) 169/102, pulse 68, resp. rate 16, height '5\' 3"'$  (1.6 m), weight 174 lb (78.9 kg), SpO2 96 %.     07/22/2022   11:09 AM 07/22/2022   11:02 AM 07/20/2021   11:43 AM  Vitals with BMI  Height  '5\' 3"'$  '5\' 3"'$   Weight  174 lbs 176 lbs 6 oz  BMI  [id number]  Systolic [id number]  Diastolic [id number] 98 81  Pulse 68 62 55    Physical Exam Neck:     Vascular: JVD present. No carotid bruit.  Cardiovascular:     Rate and Rhythm: Normal rate. Rhythm irregular.     Pulses: Intact distal pulses.          Dorsalis pedis pulses are 2+ on the right side and 2+ on the left side.       Posterior tibial pulses are 2+ on the right side and 2+ on the left side.     Heart sounds: Murmur heard.      Midsystolic murmur is present with a grade of 2/6 at the apex.  Pulmonary:     Effort: Pulmonary effort is normal.     Breath sounds: Examination of the right-lower field reveals rhonchi. Examination of the left-lower field reveals rhonchi. Rhonchi present.  Abdominal:     General: Bowel sounds are normal.     Palpations: Abdomen is soft.  Musculoskeletal:     Right lower leg: Edema (2+ pitting) present.     Left lower leg: Edema (2-3+ pitting) present.    Laboratory examination:   Lab Results  Component Value Date   NA 138 05/26/2020   K 4.1 05/26/2020   CO2 33 (H) 05/26/2020   GLUCOSE 93 05/26/2020   BUN 18 05/26/2020   CREATININE 1.08 (H) 05/26/2020   CALCIUM 9.6 05/26/2020   GFRNONAA 51 (L) 05/26/2020    Lab Results  Component Value Date   TSH 1.220 05/26/2020    Lab Results  Component Value Date   CHOL 165 05/26/2020   HDL 98 05/26/2020   LDLCALC 59 05/26/2020   TRIG 30 05/26/2020   CHOLHDL 2.1 10/25/2010    Lab Results  Component Value Date   WBC 3.7 05/26/2020   HGB 10.6 (L) 05/26/2020   HCT 31.7 (L) 05/26/2020   MCV 97 05/26/2020   PLT 155 05/26/2020    External labs     Hemoglobin 10.600 G/ 05/26/2020 Platelets 155.000 x1 05/26/2020  Creatinine, Serum 1.080 mg/ 05/26/2020 Potassium 4.100 mm 05/26/2020 ALT (SGPT) 17.000 IU/ 05/26/2020  TSH 1.220 05/26/2020 BNP 99.700 pg/ 05/26/2020   Medications and allergies  No Known Allergies    Current Outpatient Medications:    acetaminophen (TYLENOL) 325 MG tablet, Take 650 mg by mouth every 6 (six) hours as needed for moderate pain or headache., Disp: , Rfl:    albuterol (PROVENTIL) (2.5 MG/3ML) 0.083% nebulizer solution, Take 2.5 mg by nebulization every 6 (six) hours as needed for wheezing or shortness of breath., Disp: , Rfl:    amLODipine (NORVASC) 5 MG tablet, Take 1 tablet (5 mg total) by mouth every morning., Disp: 30 tablet, Rfl: 2   atorvastatin (LIPITOR) 20 MG tablet, TAKE 1 TABLET BY MOUTH EVERY  DAY, Disp: 90 tablet, Rfl: 2   ferrous sulfate 325 (65 FE) MG tablet, Take 325 mg by mouth daily., Disp: , Rfl:    fluticasone furoate-vilanterol (BREO ELLIPTA) 100-25 MCG/ACT AEPB, Inhale 1 puff into the lungs daily., Disp: 1 each, Rfl: 11   furosemide (LASIX) 20 MG tablet, Take 1 tablet (20 mg total) by mouth every morning., Disp: 90 tablet, Rfl: 3   losartan (COZAAR) 25 MG tablet, Take 25 mg by mouth every evening., Disp: , Rfl:    meclizine (ANTIVERT) 25 MG tablet, Take 25 mg by mouth every 8 (eight) hours as needed., Disp: , Rfl:    metoprolol succinate (TOPROL-XL) 50 MG 24 hr tablet, TAKE 1 TABLET BY MOUTH DAILY. TAKE WITH OR IMMEDIATELY FOLLOWING A MEAL., Disp: 90 tablet, Rfl: 3   montelukast (SINGULAIR) 10 MG tablet, Take 10 mg by mouth at bedtime., Disp: , Rfl:    potassium chloride (KLOR-CON) 10 MEQ tablet, Take 1 tablet (10 mEq total) by mouth daily., Disp: 90 tablet, Rfl: 3   PROAIR HFA 108 (90 Base) MCG/ACT inhaler, Inhale 2 puffs into the lungs 4 (four) times daily as needed for shortness of breath or wheezing., Disp: , Rfl: 2   RESTASIS 0.05 % ophthalmic emulsion, Place 1 drop into both eyes 2 (two) times daily as needed (dry eyes)., Disp: , Rfl:    traMADol (ULTRAM) 50 MG tablet, Take 50 mg by mouth daily as needed., Disp: , Rfl:    Vitamin D, Ergocalciferol, (DRISDOL) 50000 units CAPS capsule, Take 50,000 Units by mouth every Wednesday., Disp: , Rfl:    XARELTO 15 MG TABS tablet, TAKE 1 TABLET (15 MG TOTAL) BY MOUTH DAILY WITH SUPPER, Disp: 90 tablet, Rfl: 3    Radiology:   Chest x-ray PA and lateral view 05/26/2020: Normal heart size. Aortic tortuosity. No pleural effusion or edema. No airspace consolidation. Three multiple compression deformities are identified within the upper and midthoracic spine. Two of these levels have been treated with bone cement.  Cardiac Studies:   PFO closure 09/03/2016: Interventional data: Successful closure of the atrial septal defect under  intracardiac echo guidance with implantation of a 25 millimeter septal occluder. Post-deployment double contrast study revealed no residual shunting.  Lexiscan Thalium stress 03/20/2017:  1. Resting EKG: NSR. Non diagnostic stress EKG due to pharmacologic stress.  2. Normal SPECT study with normal left ventricular systolic function. Low risk study.  Echocardiogram 06/02/2020: Normal LV systolic function with  visual EF 60-65%. Left ventricle cavity is normal in size. Normal global wall motion. Indeterminate diastolic filling pattern, elevated LAP. Calculated EF 65%. Mild (Grade I) mitral regurgitation. Mild tricuspid regurgitation. Interatrial septal occluder is in good position without obvious residual shunting or thrombus. Compared to prior study dated 07/15/2019 no significant change.   EKG:  EKG 07/22/2022: Atrial fibrillation with controlled ventricular response at the rate of 64 bpm, normal axis, poor R progression, cannot exclude anteroseptal infarct old.  No evidence of ischemia.  Compared to 07/20/2021 and 01/19/2021, no significant change.   Assessment     ICD-10-CM   1. Persistent atrial fibrillation (HCC)  I48.19 EKG 12-Lead    PCV ECHOCARDIOGRAM COMPLETE    2. Stage 3b chronic kidney disease (HCC)  N18.32     3. Acute on chronic diastolic heart failure (HCC)  I50.33 PCV ECHOCARDIOGRAM COMPLETE    4. Bilateral leg edema  R60.0 furosemide (LASIX) 20 MG tablet    potassium chloride (KLOR-CON) 10 MEQ tablet    5. Primary hypertension  I10      CHA2DS2-VASc Score is 4.  Yearly risk of stroke: 4.8% (A, F, HTN).  Score of 1=0.6; 2=2.2; 3=3.2; 4=4.8; 5=7.2; 6=9.8; 7=>9.8) -(CHF; HTN; vasc disease DM,  Female = 1; Age <65 =0; 65-74 = 1,  >75 =2; stroke/embolism= 2).   Meds ordered this encounter  Medications   furosemide (LASIX) 20 MG tablet    Sig: Take 1 tablet (20 mg total) by mouth every morning.    Dispense:  90 tablet    Refill:  3   potassium chloride (KLOR-CON) 10 MEQ  tablet    Sig: Take 1 tablet (10 mEq total) by mouth daily.    Dispense:  90 tablet    Refill:  3   amLODipine (NORVASC) 5 MG tablet    Sig: Take 1 tablet (5 mg total) by mouth every morning.    Dispense:  30 tablet    Refill:  2    Medications Discontinued During This Encounter  Medication Reason   triamterene-hydrochlorothiazide (MAXZIDE-25) 37.5-25 MG tablet Change in therapy    Recommendations:   Morgan Bowers  is a 77 y.o. with h/o mild pancytopenia, negative GI work up in April 2019,  paroxysmal atrial fibrillation with history of cardioversion in 2018, history of PFO closure in 2018, hypertension, hyperlipidemia presents for annual visit.  1. Persistent atrial fibrillation [facility]) Patient probably now has permanent atrial fibrillation as evidenced by persistent atrial fibrillation dating back to 2022 EKG.  I had mistakenly read 1 year ago that her EKG revealed sinus rhythm but on further closer expection, she clearly was in atrial fibrillation as well.  However not sure if her symptoms of worsening leg edema is related to persistent atrial fibrillation.  I would like repeat echocardiogram, and see her back in 6 to 8 weeks for follow-up.  2. Stage 3b chronic kidney disease ([place]) Patient has stage IIIb chronic kidney disease.  In view of uncontrolled hypertension and also worsening leg edema, I will discontinue Maxide and switch her to furosemide on a daily basis 20 mg along with 10 mEq of potassium.  She has an appointment to see Dr. Jeanie Cooks for complete physical and also labs in 3 days, will follow-up on those labs.  3. Acute on Chronic diastolic heart failure ([place]) Patient has chronic diastolic heart failure, she may have acute decompensation in view of uncontrolled hypertension.  Hopefully with restriction of salt, foot elevation, addition of Lasix, symptoms of  dyspnea, leg edema will improve.  4. Bilateral leg edema Bilateral leg edema has worsened since mild edema a year  ago.  Will follow-up on this with diuretics.  5. Primary hypertension Markedly elevated blood pressure today, I have added amlodipine 5 mg daily as well along with furosemide.    [name], MD, [facility] 07/22/2022, 9:14 PM Office: [phone]

## 2022-07-24 ENCOUNTER — Other Ambulatory Visit: Payer: Self-pay | Admitting: Internal Medicine

## 2022-07-25 LAB — BRAIN NATRIURETIC PEPTIDE: Brain Natriuretic Peptide: 169 pg/mL — ABNORMAL HIGH (ref <100)

## 2022-07-26 LAB — COMPLETE METABOLIC PANEL WITH GFR
AG Ratio: 1.4 (calc) (ref 1.0–2.5)
ALT: 12 U/L (ref 6–29)
AST: 22 U/L (ref 10–35)
Albumin: 3.9 g/dL (ref 3.6–5.1)
Alkaline phosphatase (APISO): 39 U/L (ref 37–153)
BUN: 8 mg/dL (ref 7–25)
CO2: 29 mmol/L (ref 20–32)
Calcium: 9.3 mg/dL (ref 8.6–10.4)
Chloride: 101 mmol/L (ref 98–110)
Creat: 0.77 mg/dL (ref 0.60–1.00)
Globulin: 2.7 g/dL (calc) (ref 1.9–3.7)
Glucose, Bld: 85 mg/dL (ref 65–99)
Potassium: 3.7 mmol/L (ref 3.5–5.3)
Sodium: 140 mmol/L (ref 135–146)
Total Bilirubin: 0.5 mg/dL (ref 0.2–1.2)
Total Protein: 6.6 g/dL (ref 6.1–8.1)
eGFR: 79 mL/min/{1.73_m2} (ref >=60)

## 2022-07-26 LAB — TSH: TSH: 0.96 mIU/L (ref 0.40–4.50)

## 2022-07-26 LAB — URINE CULTURE
MICRO NUMBER:: 14687820
SPECIMEN QUALITY:: ADEQUATE

## 2022-07-26 LAB — CBC
HCT: 34.6 % — ABNORMAL LOW (ref 35.0–45.0)
Hemoglobin: 11.4 g/dL — ABNORMAL LOW (ref 11.7–15.5)
MCH: 32.2 pg (ref 27.0–33.0)
MCHC: 32.9 g/dL (ref 32.0–36.0)
MCV: 97.7 fL (ref 80.0–100.0)
MPV: 11.1 fL (ref 7.5–12.5)
Platelets: 103 10*3/uL — ABNORMAL LOW (ref 140–400)
RBC: 3.54 10*6/uL — ABNORMAL LOW (ref 3.80–5.10)
RDW: 12.5 % (ref 11.0–15.0)
WBC: 2.6 10*3/uL — ABNORMAL LOW (ref 3.8–10.8)

## 2022-07-26 LAB — LIPID PANEL
Cholesterol: 129 mg/dL (ref <200)
HDL: 69 mg/dL (ref >=50)
LDL Cholesterol (Calc): 47 mg/dL (calc)
Non-HDL Cholesterol (Calc): 60 mg/dL (calc) (ref <130)
Total CHOL/HDL Ratio: 1.9 (calc) (ref <5.0)
Triglycerides: 52 mg/dL (ref <150)

## 2022-07-26 LAB — VITAMIN D 25 HYDROXY (VIT D DEFICIENCY, FRACTURES): Vit D, 25-Hydroxy: 81 ng/mL (ref 30–100)

## 2022-07-26 NOTE — Progress Notes (Signed)
Labs 07/24/2022: BNP 169 (<100).

## 2022-07-27 ENCOUNTER — Emergency Department (HOSPITAL_COMMUNITY): Payer: Medicaid Other

## 2022-07-27 ENCOUNTER — Inpatient Hospital Stay (HOSPITAL_COMMUNITY)
Admission: EM | Admit: 2022-07-27 | Discharge: 2022-07-30 | DRG: 291 | Disposition: A | Discharge status: Home / Self Care | Payer: Medicaid Other | Attending: Internal Medicine | Admitting: Internal Medicine

## 2022-07-27 ENCOUNTER — Ambulatory Visit (HOSPITAL_COMMUNITY)
Admission: EM | Admit: 2022-07-27 | Discharge: 2022-07-27 | Disposition: A | Discharge status: Other Acute Inpatient Hospital | Payer: Medicaid Other | Attending: Internal Medicine | Admitting: Internal Medicine

## 2022-07-27 ENCOUNTER — Encounter (HOSPITAL_COMMUNITY): Payer: Self-pay | Admitting: Emergency Medicine

## 2022-07-27 ENCOUNTER — Other Ambulatory Visit: Payer: Self-pay

## 2022-07-27 DIAGNOSIS — I4819 Other persistent atrial fibrillation: Secondary | ICD-10-CM | POA: Diagnosis present

## 2022-07-27 DIAGNOSIS — Z7901 Long term (current) use of anticoagulants: Secondary | ICD-10-CM

## 2022-07-27 DIAGNOSIS — Z79899 Other long term (current) drug therapy: Secondary | ICD-10-CM

## 2022-07-27 DIAGNOSIS — I340 Nonrheumatic mitral (valve) insufficiency: Secondary | ICD-10-CM | POA: Diagnosis present

## 2022-07-27 DIAGNOSIS — I4891 Unspecified atrial fibrillation: Secondary | ICD-10-CM | POA: Diagnosis not present

## 2022-07-27 DIAGNOSIS — E785 Hyperlipidemia, unspecified: Secondary | ICD-10-CM

## 2022-07-27 DIAGNOSIS — D539 Nutritional anemia, unspecified: Secondary | ICD-10-CM | POA: Diagnosis present

## 2022-07-27 DIAGNOSIS — R079 Chest pain, unspecified: Secondary | ICD-10-CM

## 2022-07-27 DIAGNOSIS — A419 Sepsis, unspecified organism: Secondary | ICD-10-CM

## 2022-07-27 DIAGNOSIS — R519 Headache, unspecified: Secondary | ICD-10-CM

## 2022-07-27 DIAGNOSIS — Z91148 Patient's other noncompliance with medication regimen for other reason: Secondary | ICD-10-CM

## 2022-07-27 DIAGNOSIS — I5033 Acute on chronic diastolic (congestive) heart failure: Secondary | ICD-10-CM | POA: Diagnosis present

## 2022-07-27 DIAGNOSIS — N390 Urinary tract infection, site not specified: Secondary | ICD-10-CM | POA: Diagnosis present

## 2022-07-27 DIAGNOSIS — N182 Chronic kidney disease, stage 2 (mild): Secondary | ICD-10-CM | POA: Diagnosis present

## 2022-07-27 DIAGNOSIS — J45909 Unspecified asthma, uncomplicated: Secondary | ICD-10-CM | POA: Diagnosis present

## 2022-07-27 DIAGNOSIS — Z7951 Long term (current) use of inhaled steroids: Secondary | ICD-10-CM

## 2022-07-27 DIAGNOSIS — J069 Acute upper respiratory infection, unspecified: Secondary | ICD-10-CM | POA: Diagnosis present

## 2022-07-27 DIAGNOSIS — E876 Hypokalemia: Secondary | ICD-10-CM | POA: Diagnosis not present

## 2022-07-27 DIAGNOSIS — I130 Hypertensive heart and chronic kidney disease with heart failure and stage 1 through stage 4 chronic kidney disease, or unspecified chronic kidney disease: Principal | ICD-10-CM | POA: Diagnosis present

## 2022-07-27 DIAGNOSIS — R6510 Systemic inflammatory response syndrome (SIRS) of non-infectious origin without acute organ dysfunction: Secondary | ICD-10-CM

## 2022-07-27 DIAGNOSIS — T501X6A Underdosing of loop [high-ceiling] diuretics, initial encounter: Secondary | ICD-10-CM | POA: Diagnosis present

## 2022-07-27 DIAGNOSIS — E872 Acidosis, unspecified: Secondary | ICD-10-CM | POA: Diagnosis present

## 2022-07-27 DIAGNOSIS — Z1152 Encounter for screening for COVID-19: Secondary | ICD-10-CM

## 2022-07-27 DIAGNOSIS — I10 Essential (primary) hypertension: Secondary | ICD-10-CM | POA: Diagnosis present

## 2022-07-27 DIAGNOSIS — E7800 Pure hypercholesterolemia, unspecified: Secondary | ICD-10-CM | POA: Diagnosis present

## 2022-07-27 DIAGNOSIS — D696 Thrombocytopenia, unspecified: Secondary | ICD-10-CM | POA: Diagnosis present

## 2022-07-27 DIAGNOSIS — K219 Gastro-esophageal reflux disease without esophagitis: Secondary | ICD-10-CM | POA: Diagnosis present

## 2022-07-27 DIAGNOSIS — Z8774 Personal history of (corrected) congenital malformations of heart and circulatory system: Secondary | ICD-10-CM

## 2022-07-27 DIAGNOSIS — R531 Weakness: Secondary | ICD-10-CM

## 2022-07-27 LAB — COMPREHENSIVE METABOLIC PANEL
ALT: 16 U/L (ref 0–44)
AST: 32 U/L (ref 15–41)
Albumin: 3.5 g/dL (ref 3.5–5.0)
Alkaline Phosphatase: 37 U/L — ABNORMAL LOW (ref 38–126)
Anion gap: 10 (ref 5–15)
BUN: 9 mg/dL (ref 8–23)
CO2: 29 mmol/L (ref 22–32)
Calcium: 9.3 mg/dL (ref 8.9–10.3)
Chloride: 96 mmol/L — ABNORMAL LOW (ref 98–111)
Creatinine, Ser: 0.91 mg/dL (ref 0.44–1.00)
GFR, Estimated: >60 mL/min (ref >60)
Glucose, Bld: 125 mg/dL — ABNORMAL HIGH (ref 70–99)
Potassium: 3.8 mmol/L (ref 3.5–5.1)
Sodium: 135 mmol/L (ref 135–145)
Total Bilirubin: 1.4 mg/dL — ABNORMAL HIGH (ref 0.3–1.2)
Total Protein: 7 g/dL (ref 6.5–8.1)

## 2022-07-27 LAB — CBC WITH DIFFERENTIAL/PLATELET
Abs Immature Granulocytes: 0.03 10*3/uL (ref 0.00–0.07)
Basophils Absolute: 0 10*3/uL (ref 0.0–0.1)
Basophils Relative: 0 %
Eosinophils Absolute: 0 10*3/uL (ref 0.0–0.5)
Eosinophils Relative: 0 %
HCT: 36.6 % (ref 36.0–46.0)
Hemoglobin: 11.7 g/dL — ABNORMAL LOW (ref 12.0–15.0)
Immature Granulocytes: 0 %
Lymphocytes Relative: 6 %
Lymphs Abs: 0.6 10*3/uL — ABNORMAL LOW (ref 0.7–4.0)
MCH: 32.1 pg (ref 26.0–34.0)
MCHC: 32 g/dL (ref 30.0–36.0)
MCV: 100.3 fL — ABNORMAL HIGH (ref 80.0–100.0)
Monocytes Absolute: 0.8 10*3/uL (ref 0.1–1.0)
Monocytes Relative: 8 %
Neutro Abs: 8.2 10*3/uL — ABNORMAL HIGH (ref 1.7–7.7)
Neutrophils Relative %: 86 %
Platelets: 111 10*3/uL — ABNORMAL LOW (ref 150–400)
RBC: 3.65 MIL/uL — ABNORMAL LOW (ref 3.87–5.11)
RDW: 13.6 % (ref 11.5–15.5)
WBC: 9.6 10*3/uL (ref 4.0–10.5)
nRBC: 0 % (ref 0.0–0.2)

## 2022-07-27 LAB — URINALYSIS, ROUTINE W REFLEX MICROSCOPIC
Bacteria, UA: NONE SEEN
Bilirubin Urine: NEGATIVE
Glucose, UA: NEGATIVE mg/dL
Hgb urine dipstick: NEGATIVE
Ketones, ur: 5 mg/dL — AB
Leukocytes,Ua: NEGATIVE
Nitrite: NEGATIVE
Protein, ur: 30 mg/dL — AB
Specific Gravity, Urine: 1.014 (ref 1.005–1.030)
pH: 8 (ref 5.0–8.0)

## 2022-07-27 LAB — LACTIC ACID, PLASMA
Lactic Acid, Venous: 2.2 mmol/L (ref 0.5–1.9)
Lactic Acid, Venous: 1 mmol/L (ref 0.5–1.9)

## 2022-07-27 LAB — RESP PANEL BY RT-PCR (RSV, FLU A&B, COVID)  RVPGX2
Influenza A by PCR: NEGATIVE
Influenza B by PCR: NEGATIVE
Resp Syncytial Virus by PCR: NEGATIVE
SARS Coronavirus 2 by RT PCR: NEGATIVE

## 2022-07-27 LAB — TROPONIN I (HIGH SENSITIVITY): Troponin I (High Sensitivity): 8 ng/L (ref <18)

## 2022-07-27 LAB — PROTIME-INR
INR: 1.3 — ABNORMAL HIGH (ref 0.8–1.2)
Prothrombin Time: 15.9 seconds — ABNORMAL HIGH (ref 11.4–15.2)

## 2022-07-27 MED ORDER — ACETAMINOPHEN 325 MG PO TABS
975.0000 mg | ORAL_TABLET | Freq: Once | ORAL | Status: AC
  Administered 2022-07-27: 975 mg via ORAL

## 2022-07-27 MED ORDER — ACETAMINOPHEN 325 MG PO TABS
ORAL_TABLET | ORAL | Status: AC
  Filled 2022-07-27: qty 3

## 2022-07-27 MED ORDER — METOCLOPRAMIDE HCL 5 MG/ML IJ SOLN
5.0000 mg | Freq: Once | INTRAMUSCULAR | Status: AC
  Administered 2022-07-27: 5 mg via INTRAVENOUS
  Filled 2022-07-27: qty 2

## 2022-07-27 MED ORDER — ACETAMINOPHEN 500 MG PO TABS: 1000.0000 mg | ORAL_TABLET | Freq: Once | ORAL | Status: DC

## 2022-07-27 MED ORDER — SODIUM CHLORIDE 0.9 % IV BOLUS
500.0000 mL | Freq: Once | INTRAVENOUS | Status: AC
  Administered 2022-07-27: 500 mL via INTRAVENOUS

## 2022-07-27 MED ORDER — SODIUM CHLORIDE 0.9 % IV SOLN
1.0000 g | Freq: Once | INTRAVENOUS | Status: AC
  Administered 2022-07-28: 1 g via INTRAVENOUS
  Filled 2022-07-27: qty 10

## 2022-07-27 NOTE — ED Notes (Signed)
[facility] called and in transit.   Agricultural consultant at [facility] ED called and notified of Patient transport.

## 2022-07-27 NOTE — ED Triage Notes (Signed)
Headache ongoing but worse. Normally able to walk around. Unable to sleep and leg weakness/swelling.

## 2022-07-27 NOTE — ED Triage Notes (Signed)
TC to [facility]. No trucks available EMS called for transport

## 2022-07-27 NOTE — ED Provider Notes (Signed)
[facility] Provider Note   CSN: [id number] Arrival date & time: 07/27/22  2007     History  Chief Complaint  Patient presents with   Blood Infection    Morgan Bowers is a 77 y.o. female.  HPI Patient presents from urgent care, accompanied by her daughter. She arrives via critical care transport. Patient has multiple medical issues including A-fib, on Eliquis which she takes regularly, fluid retention for which she takes Lasix regularly.  Few days ago patient was diagnosed with a urinary tract infection, and has been taking Macrobid.  She now presents with concern for left-sided chest pain, palpitations, headache, generalized weakness and discomfort. At urgent cares patient was found to be tachycardic, 120s, and with concern for symptoms as above she was sent here for evaluation. I reviewed the urgent care note, and the patient's history with her daughter.  Patient notes head pain, no current chest pain though she may have had it earlier today.  She also complains of generalized, not focal weakness.  She notes that she has had a decrease in lower extremity edema taking her Lasix. Patient is from [place], speaks [place], also native language.    Home Medications Prior to Admission medications   Medication Sig Start Date End Date Taking? Authorizing Provider  acetaminophen (TYLENOL) 325 MG tablet Take 650 mg by mouth every 6 (six) hours as needed for moderate pain or headache.    [provider]  albuterol (PROVENTIL) (2.5 MG/3ML) 0.083% nebulizer solution Take 2.5 mg by nebulization every 6 (six) hours as needed for wheezing or shortness of breath.    [provider]  amLODipine (NORVASC) 5 MG tablet Take 1 tablet (5 mg total) by mouth every morning. 07/22/22 10/20/22  [name], MD  atorvastatin (LIPITOR) 20 MG tablet TAKE 1 TABLET BY MOUTH EVERY DAY 05/14/22   [name], MD  ferrous sulfate 325 (65 FE) MG tablet  Take 325 mg by mouth daily. 12/24/18   [provider]  fluticasone furoate-vilanterol (BREO ELLIPTA) 100-25 MCG/ACT AEPB Inhale 1 puff into the lungs daily. 07/06/21   Spero Geralds, MD  furosemide (LASIX) 20 MG tablet Take 1 tablet (20 mg total) by mouth every morning. 07/22/22 07/17/23  [name], MD  losartan (COZAAR) 25 MG tablet Take 25 mg by mouth every evening. 12/30/18   [provider]  meclizine (ANTIVERT) 25 MG tablet Take 25 mg by mouth every 8 (eight) hours as needed. 06/30/20   [provider]  metoprolol succinate (TOPROL-XL) 50 MG 24 hr tablet TAKE 1 TABLET BY MOUTH DAILY. TAKE WITH OR IMMEDIATELY FOLLOWING A MEAL. 09/25/21   [name], MD  montelukast (SINGULAIR) 10 MG tablet Take 10 mg by mouth at bedtime. 12/07/20   [provider]  potassium chloride (KLOR-CON) 10 MEQ tablet Take 1 tablet (10 mEq total) by mouth daily. 07/22/22 07/17/23  [name], MD  PROAIR HFA 108 [phone] Base) MCG/ACT inhaler Inhale 2 puffs into the lungs 4 (four) times daily as needed for shortness of breath or wheezing. 08/04/17   [provider]  RESTASIS 0.05 % ophthalmic emulsion Place 1 drop into both eyes 2 (two) times daily as needed (dry eyes). 04/15/19   [provider]  traMADol (ULTRAM) 50 MG tablet Take 50 mg by mouth daily as needed. 03/14/22   [provider]  Vitamin D, Ergocalciferol, (DRISDOL) 50000 units CAPS capsule Take 50,000 Units by mouth every Wednesday.    [provider]  XARELTO 15 MG TABS tablet TAKE 1 TABLET (15 MG TOTAL) BY MOUTH DAILY WITH SUPPER 02/19/22   [name], MD      Allergies    Patient has no known allergies.    Review of Systems   Review of Systems  All other systems reviewed and are negative.   Physical Exam Updated Vital Signs BP 118/71   Pulse 92   Temp (!) 100.4 F (38 C) (Rectal)   Resp 19   Ht 5\' 3"  (1.6 m)   Wt 78.9 kg   SpO2 93%   BMI 30.82 kg/m  Physical Exam Vitals and nursing  note reviewed.  Constitutional:      General: She is not in acute distress.    Appearance: She is well-developed.     Comments: Elderly female in no distress, awake, alert.  HENT:     Head: Normocephalic and atraumatic.  Eyes:     Conjunctiva/sclera: Conjunctivae normal.  Cardiovascular:     Rate and Rhythm: Normal rate and regular rhythm.  Pulmonary:     Effort: Pulmonary effort is normal. No respiratory distress.     Breath sounds: Normal breath sounds. No stridor.  Abdominal:     General: There is no distension.  Musculoskeletal:     Right lower leg: Edema present.     Left lower leg: Edema present.  Skin:    General: Skin is warm and dry.  Neurological:     General: No focal deficit present.     Mental Status: She is alert and oriented to person, place, and time.     Cranial Nerves: No cranial nerve deficit.  Psychiatric:        Mood and Affect: Mood normal.     ED Results / Procedures / Treatments   Labs (all labs ordered are listed, but only abnormal results are displayed) Labs Reviewed  COMPREHENSIVE METABOLIC PANEL - Abnormal; Notable for the following components:      Result Value   Chloride 96 (*)    Glucose, Bld 125 (*)    Alkaline Phosphatase 37 (*)    Total Bilirubin 1.4 (*)    All other components within normal limits  LACTIC ACID, PLASMA - Abnormal; Notable for the following components:   Lactic Acid, Venous 2.2 (*)    All other components within normal limits  CBC WITH DIFFERENTIAL/PLATELET - Abnormal; Notable for the following components:   RBC 3.65 (*)    Hemoglobin 11.7 (*)    MCV 100.3 (*)    Platelets 111 (*)    Neutro Abs 8.2 (*)    Lymphs Abs 0.6 (*)    All other components within normal limits  PROTIME-INR - Abnormal; Notable for the following components:   Prothrombin Time 15.9 (*)    INR 1.3 (*)    All other components within normal limits  URINALYSIS, ROUTINE W REFLEX MICROSCOPIC - Abnormal; Notable for the following components:    Ketones, ur 5 (*)    Protein, ur 30 (*)    All other components within normal limits  CULTURE, BLOOD (ROUTINE X 2)  CULTURE, BLOOD (ROUTINE X 2)  RESP PANEL BY RT-PCR (RSV, FLU A&B, COVID)  [id number]  LACTIC ACID, PLASMA  TROPONIN I (HIGH SENSITIVITY)    EKG EKG Interpretation  Date/Time:  Saturday July 27 2022 20:08:17 EDT Ventricular Rate:  103 PR Interval:    QRS Duration: 72 QT Interval:  412 QTC Calculation: 540 R Axis:   54 Text Interpretation:  Atrial fibrillation Borderline repolarization abnormality Prolonged QT interval Abnormal ECG Confirmed by Carmin Muskrat [phone]) on 07/27/2022 9:43:58 PM  Radiology CT Head Wo Contrast  Result Date: 07/27/2022 CLINICAL DATA:  Mental status change EXAM: CT HEAD WITHOUT CONTRAST TECHNIQUE: Contiguous axial images were obtained from the base of the skull through the vertex without intravenous contrast. RADIATION DOSE REDUCTION: This exam was performed according to the departmental dose-optimization program which includes automated exposure control, adjustment of the mA and/or kV according to patient size and/or use of iterative reconstruction technique. COMPARISON:  CT brain 06/27/2018 FINDINGS: Brain: No acute territorial infarction, hemorrhage, or intracranial. Minimal white matter hypodensity likely chronic small vessel ischemic change. Nonenlarged ventricles Vascular: No hyperdense vessels.  No unexpected calcification Skull: Normal. Negative for fracture or focal lesion. Sinuses/Orbits: No acute finding. Other: None IMPRESSION: 1. No CT evidence for acute intracranial abnormality. 2. Minimal chronic small vessel ischemic changes of the white matter. Electronically Signed   By: Donavan Foil M.D.   On: 07/27/2022 21:00   DG Chest 2 View  Result Date: 07/27/2022 CLINICAL DATA:  Suspected sepsis. EXAM: CHEST - 2 VIEW COMPARISON:  05/26/2020 FINDINGS: The cardio pericardial silhouette is enlarged. Low volume film. Vascular congestion with  diffuse interstitial opacity suggests edema. Bones are diffusely demineralized. Telemetry leads overlie the chest. IMPRESSION: Low volume film with vascular congestion and diffuse interstitial opacity suggesting edema. Electronically Signed   By: Misty Stanley M.D.   On: 07/27/2022 20:46    Procedures Procedures    Medications Ordered in ED Medications  metoCLOPramide (REGLAN) injection 5 mg (has no administration in time range)  sodium chloride 0.9 % bolus 500 mL (0 mLs Intravenous Stopped 07/27/22 2239)    ED Course/ Medical Decision Making/ A&P                             Medical Decision Making Elderly female with multiple medical issues including A-fib, chronic lower extremity edema likely secondary to diastolic dysfunction with preserved ejection fraction on echocardiogram 2 years ago, now presents with ongoing therapy for urinary tract infection with fever, tachycardia, concern for SIRS in addition to chest pain.  Patient is awake, alert.  Given her chest pain ACS is on the differential, though initial EKG is nonischemic.  Patient's initial tachycardia, fever, both improved with Tylenol provided urgent care, fluids provided here. Patient found to have mild lactic acidosis.  Head CT unremarkable, and she had no neurocomplaints, MRI not indicated, low suspicion for stroke.  Amount and/or Complexity of Data Reviewed Independent Historian: caregiver External Data Reviewed: notes.    Details: Urgent care notes reviewed, as above Labs: ordered. Decision-making details documented in ED Course. Radiology: ordered and independent interpretation performed. Decision-making details documented in ED Course. ECG/medicine tests: ordered and independent interpretation performed. Decision-making details documented in ED Course.  Risk Prescription drug management. Decision regarding hospitalization.   11:54 PM Patient feeling slightly better, still has mild generalized discomfort including  headache, chest pain.  Initial troponin normal, EKG shows A-fib without ischemic changes, there is low suspicion for ongoing ACS.  Patient's initial lactic acidosis is improved, but with concern for SIRS with initial fever, tachycardia, tachypnea, and likely source of urinary tract infection the patient will be admitted for further monitoring, management. Patient had increased antibiotic coverage with ceftriaxone for prior GI result.  Urine cultures, blood cultures pending on admission.         Final Clinical Impression(s) / ED  Diagnoses Final diagnoses:  SIRS (systemic inflammatory response syndrome) (HCC)     Carmin Muskrat, MD 07/27/22 2355

## 2022-07-27 NOTE — ED Notes (Signed)
Patient is being discharged from the Urgent Care and sent to the Emergency Department via [place]. Per Gwenette Greet NP, patient is in need of higher level of care due to Afib, Tachycardia, and bilateral lower extremity weakness. Patient is aware and verbalizes understanding of plan of care.  Vitals:   07/27/22 1805  BP: 136/77  Pulse: (!) 121  Resp: 18  Temp: 98.9 F (37.2 C)  SpO2: 96%

## 2022-07-27 NOTE — ED Triage Notes (Addendum)
Pt BIB [facility] from UC, pt reports having recent UTI and began Macrobid x 2 days ago, c/o weakness, fever, palpitations (hx of Afib with RVR), headache, frequent urination; pt given 583ml NS en route, v/s 116/70, HR 90-120, pt given Tylenol at UC, pt reports recent BP med change and states BP is normally higher than it is today

## 2022-07-27 NOTE — ED Provider Notes (Signed)
Patient presents to urgent care with her daughter who contributes to the history for evaluation of generalized weakness, generalized headache, heart palpitations, and intermittent chest pain to the left side of the chest that started yesterday. Daughter states patient was "up and walking" yesterday without weakness but woke up this morning with worsening bilateral frontal headache and generalized weakness. She has had decreased appetite over the last few days. On arrival, heart rate elevated at 121 per pulse oximeter. EKG shows heart rate 112 and atrial fibrillation with RVR. Patient has Afib at baseline and takes Xarelto (has not missed any doses). Observed heart rate on EKG monitor go from 110s to 140s with rapid ventricular response. She is not short of breath and denies current chest pain, however she has had chest pain and heart palpitations to the left chest over the last few days intermittently.  Currently having heart palpitations. History of asthma, has not used albuterol recently. No recent nausea, vomiting, diarrhea, abdominal pain, rash, sore throat, cough, ear pain, or vision changes. No dizziness, recent head injuries or falls, or urinary symptoms. Daughter reports some memory changes without one sided weakness, changes in gait from baseline, or changes in speech. Complains of bilateral eye pain associated with headache without eye redness or drainage. No recent fever/chills. Went to cardiology 3 days ago where she was placed on another antihypertensive medication to see if this would help with headache without much relief. Currently taking macrobid antibiotic for possible UTI prescribed by primary care.  Neurologically intact to her baseline to physical exam. Moves all 4 extremities voluntarily and with normal coordination. Non focal neurologic exam. EOMs intact without pain or dizziness. PERRL. Heart rate irregular to auscultation with tachycardia. Non tender to abdomen/flanks.  Recommend  transport to ED for further workup and evaluation [facility]. [facility] called, no truck available. EMS called. Patient given 975mg  tylenol before discharge from urgent care with EMS. She was able to void and provide a urine sample as well prior to discharge from UC with EMS. Left AC IV placed by nursing staff. Patient and daughter express understanding and agreement with plan.    Talbot Grumbling, [facility] 07/27/22 2010

## 2022-07-28 ENCOUNTER — Observation Stay (HOSPITAL_COMMUNITY): Payer: Medicaid Other

## 2022-07-28 DIAGNOSIS — Z91148 Patient's other noncompliance with medication regimen for other reason: Secondary | ICD-10-CM | POA: Diagnosis not present

## 2022-07-28 DIAGNOSIS — I5031 Acute diastolic (congestive) heart failure: Secondary | ICD-10-CM | POA: Diagnosis not present

## 2022-07-28 DIAGNOSIS — E876 Hypokalemia: Secondary | ICD-10-CM | POA: Diagnosis not present

## 2022-07-28 DIAGNOSIS — N182 Chronic kidney disease, stage 2 (mild): Secondary | ICD-10-CM | POA: Diagnosis present

## 2022-07-28 DIAGNOSIS — E872 Acidosis, unspecified: Secondary | ICD-10-CM | POA: Diagnosis present

## 2022-07-28 DIAGNOSIS — Z8774 Personal history of (corrected) congenital malformations of heart and circulatory system: Secondary | ICD-10-CM | POA: Diagnosis not present

## 2022-07-28 DIAGNOSIS — I4819 Other persistent atrial fibrillation: Secondary | ICD-10-CM | POA: Diagnosis present

## 2022-07-28 DIAGNOSIS — I5033 Acute on chronic diastolic (congestive) heart failure: Secondary | ICD-10-CM | POA: Diagnosis present

## 2022-07-28 DIAGNOSIS — R079 Chest pain, unspecified: Secondary | ICD-10-CM

## 2022-07-28 DIAGNOSIS — E7800 Pure hypercholesterolemia, unspecified: Secondary | ICD-10-CM | POA: Diagnosis present

## 2022-07-28 DIAGNOSIS — T501X6A Underdosing of loop [high-ceiling] diuretics, initial encounter: Secondary | ICD-10-CM | POA: Diagnosis present

## 2022-07-28 DIAGNOSIS — N390 Urinary tract infection, site not specified: Secondary | ICD-10-CM | POA: Diagnosis present

## 2022-07-28 DIAGNOSIS — Z7901 Long term (current) use of anticoagulants: Secondary | ICD-10-CM | POA: Diagnosis not present

## 2022-07-28 DIAGNOSIS — J45909 Unspecified asthma, uncomplicated: Secondary | ICD-10-CM | POA: Diagnosis present

## 2022-07-28 DIAGNOSIS — E785 Hyperlipidemia, unspecified: Secondary | ICD-10-CM

## 2022-07-28 DIAGNOSIS — I340 Nonrheumatic mitral (valve) insufficiency: Secondary | ICD-10-CM | POA: Diagnosis present

## 2022-07-28 DIAGNOSIS — A419 Sepsis, unspecified organism: Secondary | ICD-10-CM

## 2022-07-28 DIAGNOSIS — I130 Hypertensive heart and chronic kidney disease with heart failure and stage 1 through stage 4 chronic kidney disease, or unspecified chronic kidney disease: Secondary | ICD-10-CM | POA: Diagnosis not present

## 2022-07-28 DIAGNOSIS — Z1152 Encounter for screening for COVID-19: Secondary | ICD-10-CM | POA: Diagnosis not present

## 2022-07-28 DIAGNOSIS — R6510 Systemic inflammatory response syndrome (SIRS) of non-infectious origin without acute organ dysfunction: Secondary | ICD-10-CM | POA: Diagnosis not present

## 2022-07-28 DIAGNOSIS — R519 Headache, unspecified: Secondary | ICD-10-CM

## 2022-07-28 DIAGNOSIS — D696 Thrombocytopenia, unspecified: Secondary | ICD-10-CM | POA: Diagnosis present

## 2022-07-28 DIAGNOSIS — J069 Acute upper respiratory infection, unspecified: Secondary | ICD-10-CM | POA: Diagnosis present

## 2022-07-28 DIAGNOSIS — Z79899 Other long term (current) drug therapy: Secondary | ICD-10-CM | POA: Diagnosis not present

## 2022-07-28 DIAGNOSIS — K219 Gastro-esophageal reflux disease without esophagitis: Secondary | ICD-10-CM | POA: Diagnosis present

## 2022-07-28 DIAGNOSIS — Z7951 Long term (current) use of inhaled steroids: Secondary | ICD-10-CM | POA: Diagnosis not present

## 2022-07-28 DIAGNOSIS — D539 Nutritional anemia, unspecified: Secondary | ICD-10-CM | POA: Diagnosis present

## 2022-07-28 LAB — ECHOCARDIOGRAM COMPLETE
AR max vel: 2.48 cm2
AV Area VTI: 2.3 cm2
AV Area mean vel: 2.34 cm2
AV Mean grad: 5 mmHg
AV Peak grad: 8.3 mmHg
Ao pk vel: 1.44 m/s
Height: 63 in
S' Lateral: 3.3 cm
Weight: 2784 oz

## 2022-07-28 LAB — CBC
HCT: 34.4 % — ABNORMAL LOW (ref 36.0–46.0)
Hemoglobin: 11.1 g/dL — ABNORMAL LOW (ref 12.0–15.0)
MCH: 32.4 pg (ref 26.0–34.0)
MCHC: 32.3 g/dL (ref 30.0–36.0)
MCV: 100.3 fL — ABNORMAL HIGH (ref 80.0–100.0)
Platelets: 94 10*3/uL — ABNORMAL LOW (ref 150–400)
RBC: 3.43 MIL/uL — ABNORMAL LOW (ref 3.87–5.11)
RDW: 13.6 % (ref 11.5–15.5)
WBC: 9.5 10*3/uL (ref 4.0–10.5)
nRBC: 0 % (ref 0.0–0.2)

## 2022-07-28 LAB — BASIC METABOLIC PANEL
Anion gap: 9 (ref 5–15)
BUN: 7 mg/dL — ABNORMAL LOW (ref 8–23)
CO2: 29 mmol/L (ref 22–32)
Calcium: 9.1 mg/dL (ref 8.9–10.3)
Chloride: 99 mmol/L (ref 98–111)
Creatinine, Ser: 0.82 mg/dL (ref 0.44–1.00)
GFR, Estimated: >60 mL/min (ref >60)
Glucose, Bld: 94 mg/dL (ref 70–99)
Potassium: 3.4 mmol/L — ABNORMAL LOW (ref 3.5–5.1)
Sodium: 137 mmol/L (ref 135–145)

## 2022-07-28 LAB — MAGNESIUM: Magnesium: 1.9 mg/dL (ref 1.7–2.4)

## 2022-07-28 LAB — PROCALCITONIN: Procalcitonin: 0.3 ng/mL

## 2022-07-28 LAB — TROPONIN I (HIGH SENSITIVITY): Troponin I (High Sensitivity): 9 ng/L (ref <18)

## 2022-07-28 LAB — BRAIN NATRIURETIC PEPTIDE: B Natriuretic Peptide: 205.4 pg/mL — ABNORMAL HIGH (ref 0.0–100.0)

## 2022-07-28 MED ORDER — RIVAROXABAN 15 MG PO TABS
15.0000 mg | ORAL_TABLET | Freq: Every morning | ORAL | Status: DC
  Administered 2022-07-28 – 2022-07-30 (×3): 15 mg via ORAL
  Filled 2022-07-28 (×3): qty 1

## 2022-07-28 MED ORDER — ATORVASTATIN CALCIUM 10 MG PO TABS
20.0000 mg | ORAL_TABLET | Freq: Every day | ORAL | Status: DC
  Administered 2022-07-28 – 2022-07-30 (×3): 20 mg via ORAL
  Filled 2022-07-28 (×3): qty 2

## 2022-07-28 MED ORDER — FUROSEMIDE 10 MG/ML IJ SOLN
40.0000 mg | Freq: Two times a day (BID) | INTRAMUSCULAR | Status: DC
  Administered 2022-07-28 – 2022-07-29 (×3): 40 mg via INTRAVENOUS
  Filled 2022-07-28 (×3): qty 4

## 2022-07-28 MED ORDER — MAGNESIUM SULFATE 2 GM/50ML IV SOLN
2.0000 g | Freq: Once | INTRAVENOUS | Status: AC
  Administered 2022-07-28: 2 g via INTRAVENOUS
  Filled 2022-07-28: qty 50

## 2022-07-28 MED ORDER — SODIUM CHLORIDE 0.9 % IV SOLN
1.0000 g | INTRAVENOUS | Status: DC
  Administered 2022-07-28 – 2022-07-29 (×2): 1 g via INTRAVENOUS
  Filled 2022-07-28 (×2): qty 10

## 2022-07-28 MED ORDER — ACETAMINOPHEN 650 MG RE SUPP: 650.0000 mg | Freq: Four times a day (QID) | RECTAL | Status: DC | PRN

## 2022-07-28 MED ORDER — ACETAMINOPHEN 325 MG PO TABS
650.0000 mg | ORAL_TABLET | Freq: Four times a day (QID) | ORAL | Status: DC | PRN
  Administered 2022-07-29 (×2): 650 mg via ORAL
  Filled 2022-07-28 (×2): qty 2

## 2022-07-28 MED ORDER — METOPROLOL SUCCINATE ER 50 MG PO TB24
50.0000 mg | ORAL_TABLET | Freq: Every day | ORAL | Status: DC
  Administered 2022-07-28 – 2022-07-30 (×3): 50 mg via ORAL
  Filled 2022-07-28: qty 1
  Filled 2022-07-28: qty 2
  Filled 2022-07-28: qty 1

## 2022-07-28 MED ORDER — MONTELUKAST SODIUM 10 MG PO TABS
10.0000 mg | ORAL_TABLET | Freq: Every day | ORAL | Status: DC
  Administered 2022-07-28 – 2022-07-29 (×3): 10 mg via ORAL
  Filled 2022-07-28 (×3): qty 1

## 2022-07-28 MED ORDER — SODIUM CHLORIDE 0.9 % IV SOLN
100.0000 mg | Freq: Two times a day (BID) | INTRAVENOUS | Status: DC
  Administered 2022-07-28 (×3): 100 mg via INTRAVENOUS
  Filled 2022-07-28 (×4): qty 100

## 2022-07-28 MED ORDER — AMLODIPINE BESYLATE 5 MG PO TABS
5.0000 mg | ORAL_TABLET | ORAL | Status: DC
  Administered 2022-07-28 – 2022-07-30 (×3): 5 mg via ORAL
  Filled 2022-07-28 (×3): qty 1

## 2022-07-28 MED ORDER — POTASSIUM CHLORIDE 20 MEQ PO PACK
40.0000 meq | PACK | Freq: Two times a day (BID) | ORAL | Status: AC
  Administered 2022-07-28 – 2022-07-29 (×3): 40 meq via ORAL
  Filled 2022-07-28 (×3): qty 2

## 2022-07-28 MED ORDER — ALBUTEROL SULFATE (2.5 MG/3ML) 0.083% IN NEBU: 2.5000 mg | INHALATION_SOLUTION | Freq: Four times a day (QID) | RESPIRATORY_TRACT | Status: DC | PRN

## 2022-07-28 MED ORDER — FLUTICASONE FUROATE-VILANTEROL 100-25 MCG/ACT IN AEPB
1.0000 | INHALATION_SPRAY | Freq: Every day | RESPIRATORY_TRACT | Status: DC
  Administered 2022-07-28: 1 via RESPIRATORY_TRACT
  Filled 2022-07-28 (×2): qty 28

## 2022-07-28 MED ORDER — FUROSEMIDE 10 MG/ML IJ SOLN
40.0000 mg | Freq: Once | INTRAMUSCULAR | Status: AC
  Administered 2022-07-28: 40 mg via INTRAVENOUS
  Filled 2022-07-28: qty 4

## 2022-07-28 NOTE — ED Notes (Signed)
ED TO INPATIENT HANDOFF REPORT  ED Nurse Name and Phone #:   S Name/Age/Gender Morgan Bowers 77 y.o. female Room/Bed: 045C/045C  Code Status   Code Status: Full Code  Home/SNF/Other Home Patient oriented to: self, place, time, and situation Is this baseline? Yes   Triage Complete: Triage complete  Chief Complaint Acute on chronic heart failure with preserved ejection fraction (HFpEF) ([place]) [I50.33]  Triage Note Pt [facility]IB [facility], pt reports having recent UTI and began Macrobid x 2 days ago, c/o weakness, fever, palpitations (hx of Afib with RVR), headache, frequent urination; pt given 561ml NS en route, v/s 116/70, HR 90-120, pt given Tylenol at UC, pt reports recent BP med change and states BP is normally higher than it is today    Allergies No Known Allergies  Level of Care/Admitting Diagnosis ED Disposition     ED Disposition  Admit   Condition  --   [place]: [place] [[id number]]  Level of Care: Telemetry Cardiac [[id number]]  May place patient in observation at [facility] or [place] if equivalent level of care is available:: Yes  Covid Evaluation: Asymptomatic - no recent exposure (last 10 days) testing not required  Diagnosis: Acute on chronic heart failure with preserved ejection fraction (HFpEF) [facility]) [id number]  Admitting Physician: Shela Leff [id number]  Attending Physician: Shela Leff [id number]          B Medical/Surgery History Past Medical History:  Diagnosis Date   A-fib ([place])    Anemia    Arthritis    "knees" (09/03/2016)   Asthma    GERD (gastroesophageal reflux disease)    High cholesterol    Hypertension    Past Surgical History:  Procedure Laterality Date   CARDIOVERSION N/A 08/06/2016   Procedure: CARDIOVERSION;  Surgeon: [name], MD;  Location: [place];  Service: Cardiovascular;  Laterality: N/A;   CARDIOVERSION N/A 08/03/2019   Procedure: CARDIOVERSION;  Surgeon: [name], MD;  Location: [place];  Service: Cardiovascular;  Laterality: N/A;   COLONOSCOPY WITH PROPOFOL N/A 08/13/2017   Procedure: COLONOSCOPY WITH PROPOFOL;  Surgeon: Laurence Spates, MD;  Location: [place];  Service: Endoscopy;  Laterality: N/A;   ESOPHAGOGASTRODUODENOSCOPY (EGD) WITH PROPOFOL N/A 08/13/2017   Procedure: ESOPHAGOGASTRODUODENOSCOPY (EGD) WITH PROPOFOL;  Surgeon: Laurence Spates, MD;  Location: [place];  Service: Endoscopy;  Laterality: N/A;   IR GENERIC HISTORICAL  06/19/2016   IR VERTEBROPLASTY CERV/THOR BX INC UNI/BIL INC/INJECT/IMAGING 06/19/2016 Luanne Bras, MD [facility]   IR GENERIC HISTORICAL  06/19/2016   IR VERTEBROPLASTY EA ADDL (T&LS) BX INC UNI/BIL INC INJECT/IMAGING 06/19/2016 Luanne Bras, MD [facility]   PATENT FORAMEN OVALE CLOSURE  09/03/2016   PATENT FORAMEN OVALE(PFO) CLOSURE N/A 09/03/2016   Procedure: Patent [place](PFO) Closure;  Surgeon: [name], MD;  Location: [place] [facility];  Service: Cardiovascular;  Laterality: N/A;   TEE WITHOUT CARDIOVERSION N/A 02/29/2016   Procedure: TRANSESOPHAGEAL ECHOCARDIOGRAM (TEE);  Surgeon: [name], MD;  Location: [place];  Service: Cardiovascular;  Laterality: N/A;   TOE SURGERY Right 2020     A IV Location/Drains/Wounds Patient Lines/Drains/Airways Status     Active Line/Drains/Airways     Name Placement date Placement time Site Days   Peripheral IV 07/27/22 20 G Left Antecubital 07/27/22  1833  Antecubital  1   Peripheral IV 07/27/22 20 G Right Hand 07/27/22  2016  Hand  1            Intake/Output  Last 24 hours  Intake/Output Summary (Last 24 hours) at 07/28/2022 1133 Last data filed at 07/28/2022 [id number] Gross per 24 hour  Intake --  Output 1200 ml  Net -1200 ml    Labs/Imaging Results for orders placed or performed during the hospital encounter of 07/27/22 (from the past 48 hour(s))  Urinalysis, Routine w reflex microscopic -Urine, Clean Catch     Status: Abnormal    Collection Time: 07/27/22  8:06 PM  Result Value Ref Range   Color, Urine YELLOW YELLOW   APPearance CLEAR CLEAR   Specific Gravity, Urine 1.014 1.005 - 1.030   pH 8.0 5.0 - 8.0   Glucose, UA NEGATIVE NEGATIVE mg/dL   Hgb urine dipstick NEGATIVE NEGATIVE   Bilirubin Urine NEGATIVE NEGATIVE   Ketones, ur 5 (A) NEGATIVE mg/dL   Protein, ur 30 (A) NEGATIVE mg/dL   Nitrite NEGATIVE NEGATIVE   Leukocytes,Ua NEGATIVE NEGATIVE   RBC / HPF 0-5 0 - 5 RBC/hpf   WBC, UA 0-5 0 - 5 WBC/hpf   Bacteria, UA NONE SEEN NONE SEEN   Squamous Epithelial / HPF 0-5 0 - 5 /HPF    Comment: Performed at [facility], [place] [address] [place], [place] [address]  Comprehensive metabolic panel     Status: Abnormal   Collection Time: 07/27/22  8:14 PM  Result Value Ref Range   Sodium 135 135 - 145 mmol/L   Potassium 3.8 3.5 - 5.1 mmol/L    Comment: HEMOLYSIS AT THIS LEVEL MAY AFFECT RESULT   Chloride 96 (L) 98 - 111 mmol/L   CO2 29 22 - 32 mmol/L   Glucose, Bld 125 (H) 70 - 99 mg/dL    Comment: Glucose reference range applies only to samples taken after fasting for at least 8 hours.   BUN 9 8 - 23 mg/dL   Creatinine, Ser 0.91 0.44 - 1.00 mg/dL   Calcium 9.3 8.9 - 10.3 mg/dL   Total Protein 7.0 6.5 - 8.1 g/dL   Albumin 3.5 3.5 - 5.0 g/dL   AST 32 15 - 41 U/L    Comment: HEMOLYSIS AT THIS LEVEL MAY AFFECT RESULT   ALT 16 0 - 44 U/L    Comment: HEMOLYSIS AT THIS LEVEL MAY AFFECT RESULT   Alkaline Phosphatase 37 (L) 38 - 126 U/L   Total Bilirubin 1.4 (H) 0.3 - 1.2 mg/dL    Comment: HEMOLYSIS AT THIS LEVEL MAY AFFECT RESULT   GFR, Estimated >60 >60 mL/min    Comment: (NOTE) Calculated using the CKD-EPI Creatinine Equation (2021)    Anion gap 10 5 - 15    Comment: Performed at [facility], [place] [address] [place], [place] [address]  Lactic acid, plasma     Status: Abnormal   Collection Time: 07/27/22  8:14 PM  Result Value Ref Range   Lactic Acid, Venous 2.2 (HH) 0.5 - 1.9 mmol/L     Comment: ATTEMPTED CALL TO [phone] 07/27/22 2134 M KOROLESKI CRITICAL RESULT CALLED TO, READ BACK BY AND VERIFIED WITH MANDY FOX RN 07/27/22 2154 Wiliam Ke Performed at [facility], [place] [address] [place], [place] [address]   CBC with Differential     Status: Abnormal   Collection Time: 07/27/22  8:14 PM  Result Value Ref Range   WBC 9.6 4.0 - 10.5 K/uL   RBC 3.65 (L) 3.87 - 5.11 MIL/uL   Hemoglobin 11.7 (L) 12.0 - 15.0 g/dL   HCT 36.6 36.0 - 46.0 %  MCV 100.3 (H) 80.0 - 100.0 fL   MCH 32.1 26.0 - 34.0 pg   MCHC 32.0 30.0 - 36.0 g/dL   RDW 13.6 11.5 - 15.5 %   Platelets 111 (L) 150 - 400 K/uL    Comment: REPEATED TO VERIFY   nRBC 0.0 0.0 - 0.2 %   Neutrophils Relative % 86 %   Neutro Abs 8.2 (H) 1.7 - 7.7 K/uL   Lymphocytes Relative 6 %   Lymphs Abs 0.6 (L) 0.7 - 4.0 K/uL   Monocytes Relative 8 %   Monocytes Absolute 0.8 0.1 - 1.0 K/uL   Eosinophils Relative 0 %   Eosinophils Absolute 0.0 0.0 - 0.5 K/uL   Basophils Relative 0 %   Basophils Absolute 0.0 0.0 - 0.1 K/uL   Immature Granulocytes 0 %   Abs Immature Granulocytes 0.03 0.00 - 0.07 K/uL    Comment: Performed at [facility], [address] [place], [place] [address]  Protime-INR     Status: Abnormal   Collection Time: 07/27/22  8:14 PM  Result Value Ref Range   Prothrombin Time 15.9 (H) 11.4 - 15.2 seconds   INR 1.3 (H) 0.8 - 1.2    Comment: (NOTE) INR goal varies based on device and disease states. Performed at [facility], [place] [address] [place], [place] [address]   Culture, blood (Routine x 2)     Status: None (Preliminary result)   Collection Time: 07/27/22  8:14 PM   Specimen: BLOOD  Result Value Ref Range   Specimen Description BLOOD LEFT ANTECUBITAL    Special Requests      BOTTLES DRAWN AEROBIC AND ANAEROBIC Blood Culture adequate volume   Culture      NO GROWTH < 12 HOURS Performed at [facility], [place] [address], [place], [place] [address]    Report Status PENDING    Troponin I (High Sensitivity)     Status: None   Collection Time: 07/27/22  8:14 PM  Result Value Ref Range   Troponin I (High Sensitivity) 8 <18 ng/L    Comment: (NOTE) Elevated high sensitivity troponin I (hsTnI) values and significant  changes across serial measurements may suggest ACS but many other  chronic and acute conditions are known to elevate hsTnI results.  Refer to the "Links" section for chest pain algorithms and additional  guidance. Performed at [facility], [place] [address], [place], [place] [address]   Culture, blood (Routine x 2)     Status: None (Preliminary result)   Collection Time: 07/27/22  8:20 PM   Specimen: BLOOD RIGHT HAND  Result Value Ref Range   Specimen Description BLOOD RIGHT HAND    Special Requests      BOTTLES DRAWN AEROBIC AND ANAEROBIC Blood Culture adequate volume   Culture      NO GROWTH < 12 HOURS Performed at [facility], [place] [address], [place], [place] [address]    Report Status PENDING   Resp panel by RT-PCR (RSV, Flu A&B, Covid) Anterior Nasal Swab     Status: None   Collection Time: 07/27/22  9:56 PM   Specimen: Anterior Nasal Swab  Result Value Ref Range   SARS Coronavirus 2 by RT PCR NEGATIVE NEGATIVE   Influenza A by PCR NEGATIVE NEGATIVE   Influenza B by PCR NEGATIVE NEGATIVE    Comment: (NOTE) The Xpert Xpress SARS-CoV-2/FLU/RSV plus assay is intended as an aid in the diagnosis of influenza from Nasopharyngeal swab  specimens and should not be used as a sole basis for treatment. Nasal washings and aspirates are unacceptable for Xpert Xpress SARS-CoV-2/FLU/RSV testing.  Fact Sheet for Patients: EntrepreneurPulse.com.au  Fact Sheet for Healthcare Providers: IncredibleEmployment.be  This test is not yet approved or cleared by the [place] FDA and has been authorized for detection and/or diagnosis of SARS-CoV-2 by FDA under an Emergency Use Authorization (EUA). This EUA  will remain in effect (meaning this test can be used) for the duration of the COVID-19 declaration under Section 564(b)(1) of the Act, 21 U.S.C. section 360bbb-3(b)(1), unless the authorization is terminated or revoked.     Resp Syncytial Virus by PCR NEGATIVE NEGATIVE    Comment: (NOTE) Fact Sheet for Patients: EntrepreneurPulse.com.au  Fact Sheet for Healthcare Providers: IncredibleEmployment.be  This test is not yet approved or cleared by the [place] FDA and has been authorized for detection and/or diagnosis of SARS-CoV-2 by FDA under an Emergency Use Authorization (EUA). This EUA will remain in effect (meaning this test can be used) for the duration of the COVID-19 declaration under Section 564(b)(1) of the Act, 21 U.S.C. section 360bbb-3(b)(1), unless the authorization is terminated or revoked.  Performed at [facility], [place] [address] [place], [place] [address]   Lactic acid, plasma     Status: None   Collection Time: 07/27/22 10:45 PM  Result Value Ref Range   Lactic Acid, Venous 1.0 0.5 - 1.9 mmol/L    Comment: Performed at [place] [facility] [address], [place], [place] [address]  Troponin I (High Sensitivity)     Status: None   Collection Time: 07/28/22 12:07 AM  Result Value Ref Range   Troponin I (High Sensitivity) 9 <18 ng/L    Comment: (NOTE) Elevated high sensitivity troponin I (hsTnI) values and significant  changes across serial measurements may suggest ACS but many other  chronic and acute conditions are known to elevate hsTnI results.  Refer to the "Links" section for chest pain algorithms and additional  guidance. Performed at [facility], [place] [address] [place], [place] [address]   Brain natriuretic peptide     Status: Abnormal   Collection Time: 07/28/22  1:21 AM  Result Value Ref Range   B Natriuretic Peptide 205.4 (H) 0.0 - 100.0 pg/mL    Comment: Performed at [facility] [address], [place], [place] [address]  Procalcitonin     Status: None   Collection Time: 07/28/22  1:21 AM  Result Value Ref Range   Procalcitonin 0.30 ng/mL    Comment:        Interpretation: PCT (Procalcitonin) <= 0.5 ng/mL: Systemic infection (sepsis) is not likely. Local bacterial infection is possible. (NOTE)       Sepsis PCT Algorithm           Lower Respiratory Tract                                      Infection PCT Algorithm    ----------------------------     ----------------------------         PCT < 0.25 ng/mL                PCT < 0.10 ng/mL          Strongly encourage             Strongly discourage   discontinuation of antibiotics  initiation of antibiotics    ----------------------------     -----------------------------       PCT 0.25 - 0.50 ng/mL            PCT 0.10 - 0.25 ng/mL               OR       >80% decrease in PCT            Discourage initiation of                                            antibiotics      Encourage discontinuation           of antibiotics    ----------------------------     -----------------------------         PCT >= 0.50 ng/mL              PCT 0.26 - 0.50 ng/mL               AND        <80% decrease in PCT             Encourage initiation of                                             antibiotics       Encourage continuation           of antibiotics    ----------------------------     -----------------------------        PCT >= 0.50 ng/mL                  PCT > 0.50 ng/mL               AND         increase in PCT                  Strongly encourage                                      initiation of antibiotics    Strongly encourage escalation           of antibiotics                                     -----------------------------                                           PCT <= 0.25 ng/mL                                                 OR                                        >  80% decrease in PCT                                       Discontinue / Do not initiate                                             antibiotics  Performed at [facility], [place] [address], [place], [place] [address]   Magnesium     Status: None   Collection Time: 07/28/22  1:21 AM  Result Value Ref Range   Magnesium 1.9 1.7 - 2.4 mg/dL    Comment: Performed at [facility] [address], [place], [place] [id number]  Basic metabolic panel     Status: Abnormal   Collection Time: 07/28/22  1:21 AM  Result Value Ref Range   Sodium 137 135 - 145 mmol/L   Potassium 3.4 (L) 3.5 - 5.1 mmol/L   Chloride 99 98 - 111 mmol/L   CO2 29 22 - 32 mmol/L   Glucose, Bld 94 70 - 99 mg/dL    Comment: Glucose reference range applies only to samples taken after fasting for at least 8 hours.   BUN 7 (L) 8 - 23 mg/dL   Creatinine, Ser 0.82 0.44 - 1.00 mg/dL   Calcium 9.1 8.9 - 10.3 mg/dL   GFR, Estimated >60 >60 mL/min    Comment: (NOTE) Calculated using the CKD-EPI Creatinine Equation (2021)    Anion gap 9 5 - 15    Comment: Performed at [facility] [address] [place], [place] [address]  CBC     Status: Abnormal   Collection Time: 07/28/22  1:21 AM  Result Value Ref Range   WBC 9.5 4.0 - 10.5 K/uL   RBC 3.43 (L) 3.87 - 5.11 MIL/uL   Hemoglobin 11.1 (L) 12.0 - 15.0 g/dL   HCT 34.4 (L) 36.0 - 46.0 %   MCV 100.3 (H) 80.0 - 100.0 fL   MCH 32.4 26.0 - 34.0 pg   MCHC 32.3 30.0 - 36.0 g/dL   RDW 13.6 11.5 - 15.5 %   Platelets 94 (L) 150 - 400 K/uL    Comment: Immature Platelet Fraction may be clinically indicated, consider ordering this additional test [id number] REPEATED TO VERIFY    nRBC 0.0 0.0 - 0.2 %    Comment: Performed at [facility], [place] [address], [place], [place] [address]   CT Head Wo Contrast  Result Date: 07/27/2022 CLINICAL DATA:  Mental status change EXAM: CT HEAD WITHOUT CONTRAST TECHNIQUE: Contiguous axial images were obtained from the base of the skull through the vertex without intravenous  contrast. RADIATION DOSE REDUCTION: This exam was performed according to the departmental dose-optimization program which includes automated exposure control, adjustment of the mA and/or kV according to patient size and/or use of iterative reconstruction technique. COMPARISON:  CT brain 06/27/2018 FINDINGS: Brain: No acute territorial infarction, hemorrhage, or intracranial. Minimal white matter hypodensity likely chronic small vessel ischemic change. Nonenlarged ventricles Vascular: No hyperdense vessels.  No unexpected calcification Skull: Normal. Negative for fracture or focal lesion. Sinuses/Orbits: No acute finding. Other: None IMPRESSION: 1. No CT evidence for acute intracranial abnormality. 2. Minimal chronic small vessel ischemic changes of the white matter. Electronically Signed   By: Maudie Mercury  Francoise Ceo M.D.   On: 07/27/2022 21:00   DG Chest 2 View  Result Date: 07/27/2022 CLINICAL DATA:  Suspected sepsis. EXAM: CHEST - 2 VIEW COMPARISON:  05/26/2020 FINDINGS: The cardio pericardial silhouette is enlarged. Low volume film. Vascular congestion with diffuse interstitial opacity suggests edema. Bones are diffusely demineralized. Telemetry leads overlie the chest. IMPRESSION: Low volume film with vascular congestion and diffuse interstitial opacity suggesting edema. Electronically Signed   By: Misty Stanley M.D.   On: 07/27/2022 20:46    Pending Labs Unresulted Labs (From admission, onward)     Start     Ordered   07/29/22 0500  CBC  Tomorrow morning,   R        07/28/22 0904   07/29/22 0500  Comprehensive metabolic panel  Tomorrow morning,   R        07/28/22 0904   07/29/22 0500  Magnesium  Tomorrow morning,   R        07/28/22 0904   07/28/22 0055  Urine Culture (for pregnant, neutropenic or urologic patients or patients with an indwelling urinary catheter)  (Urine Labs)  Add-on,   AD       Question:  Indication  Answer:  Sepsis   07/28/22 0054   07/28/22 0045  MRSA Next Gen by PCR, Nasal   Once,   R        07/28/22 0045            Vitals/Pain Today's Vitals   07/28/22 0900 07/28/22 0915 07/28/22 0930 07/28/22 0945  BP:      Pulse: (!) 119 (!) 112 (!) 109 (!) 121  Resp: (!) 28 (!) 22 (!) 27 (!) 30  Temp:      TempSrc:      SpO2: 96% 98% 96% 100%  Weight:      Height:      PainSc:        Isolation Precautions No active isolations  Medications Medications  cefTRIAXone (ROCEPHIN) 1 g in sodium chloride 0.9 % 100 mL IVPB (has no administration in time range)  doxycycline (VIBRAMYCIN) 100 mg in sodium chloride 0.9 % 250 mL IVPB (0 mg Intravenous Stopped 07/28/22 1011)  amLODipine (NORVASC) tablet 5 mg (5 mg Oral Given 07/28/22 0715)  atorvastatin (LIPITOR) tablet 20 mg (20 mg Oral Given 07/28/22 0956)  metoprolol succinate (TOPROL-XL) 24 hr tablet 50 mg (50 mg Oral Given 07/28/22 0957)  Rivaroxaban (XARELTO) tablet 15 mg (15 mg Oral Given 07/28/22 0715)  fluticasone furoate-vilanterol (BREO ELLIPTA) 100-25 MCG/ACT 1 puff (1 puff Inhalation Given 07/28/22 0903)  montelukast (SINGULAIR) tablet 10 mg (10 mg Oral Given 07/28/22 0241)  albuterol (PROVENTIL) (2.5 MG/3ML) 0.083% nebulizer solution 2.5 mg (has no administration in time range)  acetaminophen (TYLENOL) tablet 650 mg (has no administration in time range)    Or  acetaminophen (TYLENOL) suppository 650 mg (has no administration in time range)  potassium chloride (KLOR-CON) packet 40 mEq (40 mEq Oral Given 07/28/22 0957)  furosemide (LASIX) injection 40 mg (40 mg Intravenous Given 07/28/22 0957)  sodium chloride 0.9 % bolus 500 mL (0 mLs Intravenous Stopped 07/27/22 2239)  metoCLOPramide (REGLAN) injection 5 mg (5 mg Intravenous Given 07/27/22 2305)  cefTRIAXone (ROCEPHIN) 1 g in sodium chloride 0.9 % 100 mL IVPB (0 g Intravenous Stopped 07/28/22 0042)  furosemide (LASIX) injection 40 mg (40 mg Intravenous Given 07/28/22 0242)  magnesium sulfate IVPB 2 g 50 mL (0 g Intravenous Stopped 07/28/22 0822)    Mobility walks  with  device     Focused Assessments Pulmonary Assessment Handoff:  Lung sounds:          R Recommendations: See Admitting Provider Note  Report given to:   Additional Notes:

## 2022-07-28 NOTE — H&P (Addendum)
History and Physical    Morgan Bowers [id number] DOB: 10-21-1945 DOA: 07/27/2022  PCP: Morgan Ebbs, MD  Patient coming from: Home  Chief Complaint: Shortness of breath  HPI: Morgan Bowers is a 77 y.o. female with medical history significant of persistent A-fib on Xarelto, history of PFO closure in 2018, hypertension, hyperlipidemia, pancytopenia, CKD stage IIIa, chronic HFpEF, asthma, GERD, anemia, arthritis presented to urgent care with headaches, chest pain, palpitations, and generalized weakness.  Noted to be in A-fib with RVR with rate in the 140s and sent to the ED for further evaluation.  Patient was given 500 cc IV fluids by EMS.  In the ED, patient noted to be febrile with temperature 100.4 F.  Labs showing no leukocytosis, hemoglobin 11.7 (stable), platelet count 111k (stable), no significant electrolyte abnormalities, UA without signs of infection, lactic acid 2.2> 1.0, blood cultures drawn, troponin negative, COVID/influenza/RSV PCR negative.  Chest x-ray showing vascular congestion and diffuse interstitial opacities concerning for pulmonary edema.  CT head negative for acute finding. Patient received Reglan and 500 cc IV fluids in the ED.  TRH called to admit.  Patient reports progressively worsening shortness of breath and bilateral lower extremity edema.  She is not taking Lasix regularly.  States she saw her doctor a few days ago and was advised to take Lasix so she took a dose on Thursday.  Denies fevers or chest pain.  States the last time she had chest pain was 2 years ago.  She is reporting intermittent headaches for a few weeks for which she takes Tylenol at home.  Denies urinary symptoms, abdominal pain, nausea, or vomiting.  No other complaints.  Review of Systems:  Review of Systems  All other systems reviewed and are negative.   Past Medical History:  Diagnosis Date   A-fib ([place])    Anemia    Arthritis    "knees" (09/03/2016)   Asthma    GERD  (gastroesophageal reflux disease)    High cholesterol    Hypertension     Past Surgical History:  Procedure Laterality Date   CARDIOVERSION N/A 08/06/2016   Procedure: CARDIOVERSION;  Surgeon: [name], MD;  Location: [facility] ENDOSCOPY;  Service: Cardiovascular;  Laterality: N/A;   CARDIOVERSION N/A 08/03/2019   Procedure: CARDIOVERSION;  Surgeon: [name], MD;  Location: [place];  Service: Cardiovascular;  Laterality: N/A;   COLONOSCOPY WITH PROPOFOL N/A 08/13/2017   Procedure: COLONOSCOPY WITH PROPOFOL;  Surgeon: Laurence Spates, MD;  Location: [place];  Service: Endoscopy;  Laterality: N/A;   ESOPHAGOGASTRODUODENOSCOPY (EGD) WITH PROPOFOL N/A 08/13/2017   Procedure: ESOPHAGOGASTRODUODENOSCOPY (EGD) WITH PROPOFOL;  Surgeon: Laurence Spates, MD;  Location: [place];  Service: Endoscopy;  Laterality: N/A;   IR GENERIC HISTORICAL  06/19/2016   IR VERTEBROPLASTY CERV/THOR BX INC UNI/BIL INC/INJECT/IMAGING 06/19/2016 Luanne Bras, MD [facility]   IR GENERIC HISTORICAL  06/19/2016   IR VERTEBROPLASTY EA ADDL (T&LS) BX INC UNI/BIL INC INJECT/IMAGING 06/19/2016 Luanne Bras, MD [facility]   PATENT FORAMEN OVALE CLOSURE  09/03/2016   PATENT FORAMEN OVALE(PFO) CLOSURE N/A 09/03/2016   Procedure: [place](PFO) Closure;  Surgeon: [name], MD;  Location: [place] CV LAB;  Service: Cardiovascular;  Laterality: N/A;   TEE WITHOUT CARDIOVERSION N/A 02/29/2016   Procedure: TRANSESOPHAGEAL ECHOCARDIOGRAM (TEE);  Surgeon: [name], MD;  Location: [place];  Service: Cardiovascular;  Laterality: N/A;   TOE SURGERY Right 2020     reports that she has never smoked. She has never used smokeless tobacco.  She reports that she does not drink alcohol and does not use drugs.  No Known Allergies  Family History  Problem Relation Age of Onset   Lung disease Neg Hx     Prior to Admission medications   Medication Sig Start Date End Date Taking? Authorizing Provider   acetaminophen (TYLENOL) 325 MG tablet Take 650 mg by mouth every 6 (six) hours as needed for moderate pain or headache.   Yes [provider]  albuterol (PROVENTIL) (2.5 MG/3ML) 0.083% nebulizer solution Take 2.5 mg by nebulization every 6 (six) hours as needed for wheezing or shortness of breath.   Yes [provider]  amLODipine (NORVASC) 5 MG tablet Take 1 tablet (5 mg total) by mouth every morning. 07/22/22 10/20/22 Yes [name], MD  atorvastatin (LIPITOR) 20 MG tablet TAKE 1 TABLET BY MOUTH EVERY DAY Patient taking differently: Take 20 mg by mouth daily. 05/14/22  Yes [name], MD  ferrous sulfate 325 (65 FE) MG tablet Take 325 mg by mouth daily. 12/24/18  Yes [provider]  fluticasone furoate-vilanterol (BREO ELLIPTA) 100-25 MCG/ACT AEPB Inhale 1 puff into the lungs daily. 07/06/21  Yes Spero Geralds, MD  furosemide (LASIX) 20 MG tablet Take 1 tablet (20 mg total) by mouth every morning. 07/22/22 07/17/23 Yes [name], MD  losartan (COZAAR) 50 MG tablet Take 50 mg by mouth every evening. 12/30/18  Yes [provider]  meclizine (ANTIVERT) 25 MG tablet Take 25 mg by mouth every 8 (eight) hours as needed for dizziness. 06/30/20  Yes [provider]  metoprolol succinate (TOPROL-XL) 50 MG 24 hr tablet TAKE 1 TABLET BY MOUTH DAILY. TAKE WITH OR IMMEDIATELY FOLLOWING A MEAL. Patient taking differently: Take 50 mg by mouth daily. 09/25/21  Yes [name], MD  montelukast (SINGULAIR) 10 MG tablet Take 10 mg by mouth at bedtime. 12/07/20  Yes [provider]  nitrofurantoin, macrocrystal-monohydrate, (MACROBID) 100 MG capsule Take 100 mg by mouth 2 (two) times daily. 07/24/22  Yes [provider]  potassium chloride (KLOR-CON) 10 MEQ tablet Take 1 tablet (10 mEq total) by mouth daily. 07/22/22 07/17/23 Yes [name], MD  PROAIR HFA 108 [phone] Base) MCG/ACT inhaler Inhale 2 puffs into the lungs 4 (four) times daily as needed for shortness of breath  or wheezing. 08/04/17  Yes [provider]  RESTASIS 0.05 % ophthalmic emulsion Place 1 drop into both eyes 2 (two) times daily as needed (dry eyes). 04/15/19  Yes [provider]  Vitamin D, Ergocalciferol, (DRISDOL) 50000 units CAPS capsule Take 50,000 Units by mouth every Wednesday.   Yes [provider]  XARELTO 15 MG TABS tablet TAKE 1 TABLET (15 MG TOTAL) BY MOUTH DAILY WITH SUPPER Patient taking differently: Take 15 mg by mouth in the morning. 02/19/22  Yes [name], MD    Physical Exam: Vitals:   07/27/22 2011 07/27/22 2012 07/27/22 2019 07/27/22 2145  BP:      Pulse:    92  Resp:    19  Temp:  97.6 F (36.4 C) (!) 100.4 F (38 C)   TempSrc:  Oral Rectal   SpO2:    93%  Weight: 78.9 kg     Height: 5\' 3"  (1.6 m)       Physical Exam Vitals reviewed.  Constitutional:      General: She is not in acute distress. HENT:     Head: Normocephalic and atraumatic.  Eyes:     Extraocular Movements: Extraocular movements intact.  Cardiovascular:     Rate and Rhythm: Tachycardia present. Rhythm irregular.     Pulses: Normal pulses.  Pulmonary:     Effort: Pulmonary effort is normal. No respiratory distress.     Breath sounds: Rales present. No wheezing.  Abdominal:     General: Bowel sounds are normal. There is no distension.     Palpations: Abdomen is soft.     Tenderness: There is no abdominal tenderness.  Musculoskeletal:     Cervical back: Normal range of motion. No rigidity.     Right lower leg: Edema present.     Left lower leg: Edema present.  Skin:    General: Skin is warm and dry.  Neurological:     General: No focal deficit present.     Mental Status: She is alert and oriented to person, place, and time.     Labs on Admission: I have personally reviewed following labs and imaging studies  CBC: Recent Labs  Lab 07/24/22 0000 07/27/22 2014  WBC 2.6* 9.6  NEUTROABS  --  8.2*  HGB 11.4* 11.7*  HCT 34.6* 36.6  MCV 97.7 100.3*   PLT 103* [id number]*   Basic Metabolic Panel: Recent Labs  Lab 07/24/22 0000 07/27/22 2014  NA 140 135  K 3.7 3.8  CL 101 96*  CO2 29 29  GLUCOSE 85 125*  BUN 8 9  CREATININE 0.77 0.91  CALCIUM 9.3 9.3   GFR: Estimated Creatinine Clearance: 51.5 mL/min (by C-G formula based on SCr of 0.91 mg/dL). Liver Function Tests: Recent Labs  Lab 07/24/22 0000 07/27/22 2014  AST 22 32  ALT 12 16  ALKPHOS  --  37*  BILITOT 0.5 1.4*  PROT 6.6 7.0  ALBUMIN  --  3.5   No results for input(s): "LIPASE", "AMYLASE" in the last 168 hours. No results for input(s): "AMMONIA" in the last 168 hours. Coagulation Profile: Recent Labs  Lab 07/27/22 2014  INR 1.3*   Cardiac Enzymes: No results for input(s): "CKTOTAL", "CKMB", "CKMBINDEX", "TROPONINI" in the last 168 hours. BNP (last 3 results) No results for input(s): "PROBNP" in the last 8760 hours. HbA1C: No results for input(s): "HGBA1C" in the last 72 hours. CBG: No results for input(s): "GLUCAP" in the last 168 hours. Lipid Profile: No results for input(s): "CHOL", "HDL", "LDLCALC", "TRIG", "CHOLHDL", "LDLDIRECT" in the last 72 hours. Thyroid Function Tests: No results for input(s): "TSH", "T4TOTAL", "FREET4", "T3FREE", "THYROIDAB" in the last 72 hours. Anemia Panel: No results for input(s): "VITAMINB12", "FOLATE", "FERRITIN", "TIBC", "IRON", "RETICCTPCT" in the last 72 hours. Urine analysis:    Component Value Date/Time   COLORURINE YELLOW 07/27/2022 2006   APPEARANCEUR CLEAR 07/27/2022 2006   LABSPEC 1.014 07/27/2022 2006   PHURINE 8.0 07/27/2022 2006   GLUCOSEU NEGATIVE 07/27/2022 2006   HGBUR NEGATIVE 07/27/2022 2006   BILIRUBINUR NEGATIVE 07/27/2022 2006   KETONESUR 5 (A) 07/27/2022 2006   PROTEINUR 30 (A) 07/27/2022 2006   UROBILINOGEN 0.2 04/15/2012 0014   NITRITE NEGATIVE 07/27/2022 2006   LEUKOCYTESUR NEGATIVE 07/27/2022 2006    Radiological Exams on Admission: CT Head Wo Contrast  Result Date: 07/27/2022 CLINICAL  DATA:  Mental status change EXAM: CT HEAD WITHOUT CONTRAST TECHNIQUE: Contiguous axial images were obtained from the base of the skull through the vertex without intravenous contrast. RADIATION DOSE REDUCTION: This exam was performed according to the departmental dose-optimization program which includes automated exposure control, adjustment of the mA and/or kV according to patient size and/or use of iterative reconstruction technique. COMPARISON:  CT brain 06/27/2018 FINDINGS: Brain: No acute territorial infarction, hemorrhage, or intracranial. Minimal white matter hypodensity likely chronic small vessel ischemic change. Nonenlarged ventricles Vascular: No hyperdense vessels.  No unexpected calcification Skull: Normal. Negative for fracture or focal lesion. Sinuses/Orbits: No acute finding. Other: None IMPRESSION: 1. No CT evidence for acute intracranial abnormality. 2. Minimal chronic small vessel ischemic changes of the white matter. Electronically Signed   By: Donavan Foil M.D.   On: 07/27/2022 21:00   DG Chest 2 View  Result Date: 07/27/2022 CLINICAL DATA:  Suspected sepsis. EXAM: CHEST - 2 VIEW COMPARISON:  05/26/2020 FINDINGS: The cardio pericardial silhouette is enlarged. Low volume film. Vascular congestion with diffuse interstitial opacity suggests edema. Bones are diffusely demineralized. Telemetry leads overlie the chest. IMPRESSION: Low volume film with vascular congestion and diffuse interstitial opacity suggesting edema. Electronically Signed   By: Misty Stanley M.D.   On: 07/27/2022 20:46    EKG: Independently reviewed. A-fib with RVR, QTc 540.  Assessment and Plan  Acute on chronic HFpEF Appears volume overloaded on exam. Chest x-ray showing vascular congestion and diffuse interstitial opacities concerning for pulmonary edema.  Not hypoxic.  Last echo done in January 2022 showing EF 60 to 65%, mild mitral regurgitation.  Give IV Lasix 40 mg at this time and check BNP.  Monitor intake and  output, daily weights, low-sodium diet with fluid restriction.  Repeat echocardiogram.  Sepsis Concern for sepsis given fever (temperature 100.4 F) and mild lactic acidosis (lactic acid 2.2> 1.0). She was recently prescribed 7-day course of Macrobid on 3/13 for UTI but UA done at this time is not suggestive of infection and she is not endorsing any urinary symptoms.  Will add on urine culture. ?Pneumonia, will treat with antibiotics at this time including ceftriaxone and doxycycline.  Tylenol as needed for fevers.  Blood cultures pending.  COVID/influenza/RSV PCR negative.  Check procalcitonin level.  MRSA PCR screen.  Complaining of intermittent headaches for a few weeks but no confusion or meningeal signs at this time.  Chest pain Patient complained of chest pain at urgent care but denies at this time.  Initial troponin negative and EKG without acute ischemic changes.  Repeat troponin pending.  Headaches CT head negative and no focal neurodeficit on exam.  No meningeal signs.  Appears comfortable, continue symptomatic management.  Persistent A-fib with RVR Rate now improved to 90-100.  Continue metoprolol and Xarelto.  Hypertension Stable.  Continue Breo and Singulair.  Albuterol as needed.  Hyperlipidemia Continue Lipitor.  CKD stage II-IIIa Renal function stable, continue to monitor.  Asthma Stable, no signs of acute exacerbation.  Continue home meds.  Addendum 07/28/2022 at 1:24 AM: QT prolongation: QTc 540 on EKG.  Continue cardiac monitoring and avoid QT prolonging drugs.  Potassium is within normal range, check magnesium level.  Repeat EKG in a.m.  Code Status: Full Code (discussed with the patient) Family Communication: No family available at this time. Level of care: Telemetry bed Admission status: It is my clinical opinion that referral for OBSERVATION is reasonable and necessary in this patient based on the above information provided. The aforementioned taken together are  felt to place the patient at high risk for further clinical deterioration. However, it is anticipated that the patient may be medically stable for discharge from the hospital within 24 to 48 hours.   Shela Leff MD [facility]  If 7PM-7AM, please contact night-coverage [email or web address]  07/28/2022, 12:32 AM

## 2022-07-28 NOTE — Progress Notes (Signed)
Echocardiogram 2D Echocardiogram has been performed.  Morgan Bowers 07/28/2022, 2:04 PM

## 2022-07-28 NOTE — Progress Notes (Signed)
PROGRESS NOTE    Morgan Bowers  [id number] DOB: 23-Jun-1945 DOA: 07/27/2022 PCP: Nolene Ebbs, MD   Brief Narrative:  Morgan Bowers is a 77 y.o. female with medical history significant of persistent A-fib on Xarelto, history of PFO closure in 2018, hypertension, hyperlipidemia, pancytopenia, CKD stage IIIa, chronic HFpEF, asthma, GERD, anemia, arthritis presented to urgent care with headaches, chest pain, palpitations, and generalized weakness.  Noted to be in A-fib with RVR with rate in the 140s and sent to the ED for further evaluation.  Patient was given 500 cc IV fluids by EMS.  In the ED, patient noted to be febrile with temperature 100.4 F.  Labs showing no leukocytosis, hemoglobin 11.7 (stable), platelet count 111k (stable), no significant electrolyte abnormalities, UA without signs of infection, lactic acid 2.2> 1.0, blood cultures drawn, troponin negative, COVID/influenza/RSV PCR negative.  Chest x-ray showing vascular congestion and diffuse interstitial opacities concerning for pulmonary edema.  CT head negative for acute finding. Patient received Reglan and 500 cc IV fluids in the ED.  TRH called to admit.   Assessment & Plan:   Acute on chronic HFpEF -Appears volume overloaded on exam. Chest x-ray showing vascular congestion and diffuse interstitial opacities concerning for pulmonary edema.   -Not hypoxic.  Last echo done in January 2022 showing EF 60 to 65%, mild mitral regurgitation.   -BNP: 205.  Repeat echo is pending. -Continue Lasix 40 IV twice daily.  Strict INO's and daily weight.  Keep potassium above 4, magnesium above 2   Severe Sepsis -Concern for sepsis given fever (temperature 100.4 F), tachycardia and mild lactic acidosis (lactic acid 2.2> 1.0).  -She was recently prescribed 7-day course of Macrobid on 3/13 for UTI but UA done at this time is not suggestive of infection and she is not endorsing any urinary symptoms.  -Urine and blood cultures  pending.  COVID/influenza/RSV PCR negative.  -Procalcitonin: 0.30.  On Rocephin and doxycycline  Headaches -CT head negative and no focal neurodeficit on exam.  No meningeal signs.  Appears comfortable, continue symptomatic management.   Persistent A-fib with RVR -Rate now improved to 90-100.   -Continue metoprolol and Xarelto.   Hypertension -Stable.  Continue amlodipine, metoprolol   hyperlipidemia -Continue Lipitor.   CKD stage II-IIIa -Renal function stable, continue to monitor.   Asthma Stable, no signs of acute exacerbation.  Continue Breo and Singulair and albuterol as needed.  On room air.  No wheezing noted on exam  QT prolongation: QTc 540 on EKG.  Continue cardiac monitoring and avoid QT prolonging drugs.  Replenish electrolytes.  Macrocytic anemia: H&H is stable. Continue to monitor  Thrombocytopenia: -Appears to be chronic.  Will continue to monitor  Hypokalemia and hypomagnesemia: Replenished  DVT prophylaxis: Xarelto Code Status: Full code Family Communication:  None present at bedside.  Plan of care discussed with patient in length and she verbalized understanding and agreed with it. Disposition Plan: Home  Consultants:  None  Procedures:  None  Antimicrobials:  Rocephin Doxycycline  Status is: Observation    Subjective: Patient seen and examined.  He reports chronic bilateral lower extremity swelling.  She still has some headache but denies shortness of breath or chest pain at this time.  Denies cough, sputum production, wheezing, recent sick contact, UTI symptoms at this time.   Objective: Vitals:   07/28/22 0700 07/28/22 0715 07/28/22 0730 07/28/22 0752  BP: (!) 147/94 (!) 147/87 (!) 150/89   Pulse: 94 96 93   Resp: 19 19 19  Temp:    (!) 97.5 F (36.4 C)  TempSrc:      SpO2: 94% 95% 96%   Weight:      Height:        Intake/Output Summary (Last 24 hours) at 07/28/2022 0848 Last data filed at 07/28/2022 0647 Gross per 24 hour   Intake --  Output 1200 ml  Net -1200 ml   Filed Weights   07/27/22 2011  Weight: 78.9 kg    Examination:  General exam: Appears calm and comfortable, on room air, communicating well Respiratory system: Rales on the bases.  No wheezing  cardiovascular system: S1 & S2 heard, RRR. No JVD, murmurs, rubs, gallops or clicks.  Bilateral 2+ pitting edema positive Gastrointestinal system: Abdomen is nondistended, soft and nontender. No organomegaly or masses felt. Normal bowel sounds heard. Central nervous system: Alert and oriented. No focal neurological deficits. Extremities: Symmetric 5 x 5 power. Skin: No rashes, lesions or ulcers Psychiatry: Judgement and insight appear normal. Mood & affect appropriate.    Data Reviewed: I have personally reviewed following labs and imaging studies  CBC: Recent Labs  Lab 07/24/22 0000 07/27/22 2014 07/28/22 0121  WBC 2.6* 9.6 9.5  NEUTROABS  --  8.2*  --   HGB 11.4* 11.7* 11.1*  HCT 34.6* 36.6 34.4*  MCV 97.7 100.3* 100.3*  PLT 103* 111* 94*   Basic Metabolic Panel: Recent Labs  Lab 07/24/22 0000 07/27/22 2014 07/28/22 0121  NA 140 135 137  K 3.7 3.8 3.4*  CL 101 96* 99  CO2 29 29 29   GLUCOSE 85 125* 94  BUN 8 9 7*  CREATININE 0.77 0.91 0.82  CALCIUM 9.3 9.3 9.1  MG  --   --  1.9   GFR: Estimated Creatinine Clearance: 57.1 mL/min (by C-G formula based on SCr of 0.82 mg/dL). Liver Function Tests: Recent Labs  Lab 07/24/22 0000 07/27/22 2014  AST 22 32  ALT 12 16  ALKPHOS  --  37*  BILITOT 0.5 1.4*  PROT 6.6 7.0  ALBUMIN  --  3.5   No results for input(s): "LIPASE", "AMYLASE" in the last 168 hours. No results for input(s): "AMMONIA" in the last 168 hours. Coagulation Profile: Recent Labs  Lab 07/27/22 2014  INR 1.3*   Cardiac Enzymes: No results for input(s): "CKTOTAL", "CKMB", "CKMBINDEX", "TROPONINI" in the last 168 hours. BNP (last 3 results) No results for input(s): "PROBNP" in the last 8760  hours. HbA1C: No results for input(s): "HGBA1C" in the last 72 hours. CBG: No results for input(s): "GLUCAP" in the last 168 hours. Lipid Profile: No results for input(s): "CHOL", "HDL", "LDLCALC", "TRIG", "CHOLHDL", "LDLDIRECT" in the last 72 hours. Thyroid Function Tests: No results for input(s): "TSH", "T4TOTAL", "FREET4", "T3FREE", "THYROIDAB" in the last 72 hours. Anemia Panel: No results for input(s): "VITAMINB12", "FOLATE", "FERRITIN", "TIBC", "IRON", "RETICCTPCT" in the last 72 hours. Sepsis Labs: Recent Labs  Lab 07/27/22 2014 07/27/22 2245 07/28/22 0121  PROCALCITON  --   --  0.30  LATICACIDVEN 2.2* 1.0  --     Recent Results (from the past 240 hour(s))  Urine Culture     Status: None   Collection Time: 07/24/22 12:00 AM  Result Value Ref Range Status   MICRO NUMBER: [id number]  Final   SPECIMEN QUALITY: Adequate  Final   Sample Source NOT GIVEN  Final   STATUS: FINAL  Final   Result:   Final    Mixed genital flora isolated. These superficial bacteria are not indicative of  a urinary tract infection. No further organism identification is warranted on this specimen. If clinically indicated, recollect clean-catch, mid-stream urine and transfer  immediately to Urine Culture Transport Tube.   Culture, blood (Routine x 2)     Status: None (Preliminary result)   Collection Time: 07/27/22  8:14 PM   Specimen: BLOOD  Result Value Ref Range Status   Specimen Description BLOOD LEFT ANTECUBITAL  Final   Special Requests   Final    BOTTLES DRAWN AEROBIC AND ANAEROBIC Blood Culture adequate volume   Culture   Final    NO GROWTH < 12 HOURS Performed at [facility], [place] [address] [place], [place] [address]    Report Status PENDING  Incomplete  Culture, blood (Routine x 2)     Status: None (Preliminary result)   Collection Time: 07/27/22  8:20 PM   Specimen: BLOOD RIGHT HAND  Result Value Ref Range Status   Specimen Description BLOOD RIGHT HAND  Final   Special  Requests   Final    BOTTLES DRAWN AEROBIC AND ANAEROBIC Blood Culture adequate volume   Culture   Final    NO GROWTH < 12 HOURS Performed at [facility], [place] [address] [place], [place] [address]    Report Status PENDING  Incomplete  Resp panel by RT-PCR (RSV, Flu A&B, Covid) Anterior Nasal Swab     Status: None   Collection Time: 07/27/22  9:56 PM   Specimen: Anterior Nasal Swab  Result Value Ref Range Status   SARS Coronavirus 2 by RT PCR NEGATIVE NEGATIVE Final   Influenza A by PCR NEGATIVE NEGATIVE Final   Influenza B by PCR NEGATIVE NEGATIVE Final    Comment: (NOTE) The Xpert Xpress SARS-CoV-2/FLU/RSV plus assay is intended as an aid in the diagnosis of influenza from Nasopharyngeal swab specimens and should not be used as a sole basis for treatment. Nasal washings and aspirates are unacceptable for Xpert Xpress SARS-CoV-2/FLU/RSV testing.  Fact Sheet for Patients: EntrepreneurPulse.com.au  Fact Sheet for Healthcare Providers: IncredibleEmployment.be  This test is not yet approved or cleared by the [place] FDA and has been authorized for detection and/or diagnosis of SARS-CoV-2 by FDA under an Emergency Use Authorization (EUA). This EUA will remain in effect (meaning this test can be used) for the duration of the COVID-19 declaration under Section 564(b)(1) of the Act, 21 U.S.C. section 360bbb-3(b)(1), unless the authorization is terminated or revoked.     Resp Syncytial Virus by PCR NEGATIVE NEGATIVE Final    Comment: (NOTE) Fact Sheet for Patients: EntrepreneurPulse.com.au  Fact Sheet for Healthcare Providers: IncredibleEmployment.be  This test is not yet approved or cleared by the [place] FDA and has been authorized for detection and/or diagnosis of SARS-CoV-2 by FDA under an Emergency Use Authorization (EUA). This EUA will remain in effect (meaning this test can be used)  for the duration of the COVID-19 declaration under Section 564(b)(1) of the Act, 21 U.S.C. section 360bbb-3(b)(1), unless the authorization is terminated or revoked.  Performed at [facility], [place] [address], [place], [place] [address]       Radiology Studies: CT Head Wo Contrast  Result Date: 07/27/2022 CLINICAL DATA:  Mental status change EXAM: CT HEAD WITHOUT CONTRAST TECHNIQUE: Contiguous axial images were obtained from the base of the skull through the vertex without intravenous contrast. RADIATION DOSE REDUCTION: This exam was performed according to the departmental dose-optimization program which includes automated exposure control, adjustment of the mA and/or kV according  to patient size and/or use of iterative reconstruction technique. COMPARISON:  CT brain 06/27/2018 FINDINGS: Brain: No acute territorial infarction, hemorrhage, or intracranial. Minimal white matter hypodensity likely chronic small vessel ischemic change. Nonenlarged ventricles Vascular: No hyperdense vessels.  No unexpected calcification Skull: Normal. Negative for fracture or focal lesion. Sinuses/Orbits: No acute finding. Other: None IMPRESSION: 1. No CT evidence for acute intracranial abnormality. 2. Minimal chronic small vessel ischemic changes of the white matter. Electronically Signed   By: Donavan Foil M.D.   On: 07/27/2022 21:00   DG Chest 2 View  Result Date: 07/27/2022 CLINICAL DATA:  Suspected sepsis. EXAM: CHEST - 2 VIEW COMPARISON:  05/26/2020 FINDINGS: The cardio pericardial silhouette is enlarged. Low volume film. Vascular congestion with diffuse interstitial opacity suggests edema. Bones are diffusely demineralized. Telemetry leads overlie the chest. IMPRESSION: Low volume film with vascular congestion and diffuse interstitial opacity suggesting edema. Electronically Signed   By: Misty Stanley M.D.   On: 07/27/2022 20:46    Scheduled Meds:  amLODipine  5 mg Oral BH-q7a   atorvastatin  20 mg  Oral Daily   fluticasone furoate-vilanterol  1 puff Inhalation Daily   metoprolol succinate  50 mg Oral Daily   montelukast  10 mg Oral QHS   potassium chloride  40 mEq Oral BID   Rivaroxaban  15 mg Oral q AM   Continuous Infusions:  cefTRIAXone (ROCEPHIN)  IV     doxycycline (VIBRAMYCIN) IV Stopped (07/28/22 0517)     LOS: 0 days   Time spent: 35 minutes   Frank Pilger Loann Quill, MD [facility]  If 7PM-7AM, please contact night-coverage [email or web address] 07/28/2022, 8:48 AM

## 2022-07-29 DIAGNOSIS — I5033 Acute on chronic diastolic (congestive) heart failure: Secondary | ICD-10-CM | POA: Diagnosis not present

## 2022-07-29 LAB — RESPIRATORY PANEL BY PCR

## 2022-07-29 LAB — COMPREHENSIVE METABOLIC PANEL
ALT: 14 U/L (ref 0–44)
AST: 20 U/L (ref 15–41)
Albumin: 3.2 g/dL — ABNORMAL LOW (ref 3.5–5.0)
Alkaline Phosphatase: 38 U/L (ref 38–126)
Anion gap: 8 (ref 5–15)
BUN: 10 mg/dL (ref 8–23)
CO2: 30 mmol/L (ref 22–32)
Calcium: 9.2 mg/dL (ref 8.9–10.3)
Chloride: 98 mmol/L (ref 98–111)
Creatinine, Ser: 0.86 mg/dL (ref 0.44–1.00)
GFR, Estimated: >60 mL/min (ref >60)
Glucose, Bld: 97 mg/dL (ref 70–99)
Potassium: 3.6 mmol/L (ref 3.5–5.1)
Sodium: 136 mmol/L (ref 135–145)
Total Bilirubin: 0.9 mg/dL (ref 0.3–1.2)
Total Protein: 6.7 g/dL (ref 6.5–8.1)

## 2022-07-29 LAB — CBC
HCT: 35.8 % — ABNORMAL LOW (ref 36.0–46.0)
Hemoglobin: 11.9 g/dL — ABNORMAL LOW (ref 12.0–15.0)
MCH: 32.9 pg (ref 26.0–34.0)
MCHC: 33.2 g/dL (ref 30.0–36.0)
MCV: 98.9 fL (ref 80.0–100.0)
Platelets: 89 10*3/uL — ABNORMAL LOW (ref 150–400)
RBC: 3.62 MIL/uL — ABNORMAL LOW (ref 3.87–5.11)
RDW: 13.5 % (ref 11.5–15.5)
WBC: 4.9 10*3/uL (ref 4.0–10.5)
nRBC: 0 % (ref 0.0–0.2)

## 2022-07-29 LAB — MAGNESIUM: Magnesium: 2 mg/dL (ref 1.7–2.4)

## 2022-07-29 LAB — URINE CULTURE: Culture: <10000 — AB

## 2022-07-29 MED ORDER — EMPAGLIFLOZIN 10 MG PO TABS
10.0000 mg | ORAL_TABLET | Freq: Every day | ORAL | Status: DC
  Administered 2022-07-29 – 2022-07-30 (×2): 10 mg via ORAL
  Filled 2022-07-29 (×2): qty 1

## 2022-07-29 MED ORDER — DOXYCYCLINE HYCLATE 100 MG PO TABS: 100.0000 mg | ORAL_TABLET | Freq: Two times a day (BID) | ORAL | Status: DC

## 2022-07-29 MED ORDER — POTASSIUM CHLORIDE 20 MEQ PO PACK
40.0000 meq | PACK | Freq: Two times a day (BID) | ORAL | Status: AC
  Administered 2022-07-29 (×2): 40 meq via ORAL
  Filled 2022-07-29: qty 2

## 2022-07-29 NOTE — Progress Notes (Addendum)
PROGRESS NOTE    Morgan Bowers  [id number] DOB: 1945-08-20 DOA: 07/27/2022 PCP: Nolene Ebbs, MD   Brief Narrative:  Morgan Bowers is a 77 y.o. female with medical history significant of persistent A-fib on Xarelto, history of PFO closure in 2018, hypertension, hyperlipidemia, pancytopenia, CKD stage IIIa, chronic HFpEF, asthma, GERD, anemia, arthritis presented to urgent care with headaches, chest pain, palpitations, and generalized weakness.  Noted to be in A-fib with RVR with rate in the 140s and sent to the ED for further evaluation.  Patient was given 500 cc IV fluids by EMS.  In the ED, patient noted to be febrile with temperature 100.4 F.  Labs showing no leukocytosis, hemoglobin 11.7 (stable), platelet count 111k (stable), no significant electrolyte abnormalities, UA without signs of infection, lactic acid 2.2> 1.0, blood cultures drawn, troponin negative, COVID/influenza/RSV PCR negative.  Chest x-ray showing vascular congestion and diffuse interstitial opacities concerning for pulmonary edema.  CT head negative for acute finding. Patient received Reglan and 500 cc IV fluids in the ED.  TRH called to admit.   Assessment & Plan:   Acute on chronic HFpEF -Appears volume overloaded on exam. Chest x-ray showing vascular congestion and diffuse interstitial opacities concerning for pulmonary edema.   - last echo done in January 2022 showing EF 60 to 65%, mild mitral regurgitation.   -BNP: 205.  Repeat echo preserved EF, indeterminate diastolic function, RV is normal -Improving with diuresis, she is 2.5 L negative, creatinine stable at 0.8, continue IV Lasix 1 dose today, add jardiance -Increase activity, discharge planning   URI, low-grade fever -Sepsis poa -Blood cultures negative, check respiratory virus panel, COVID flu and RSV were negative -Discontinue doxycycline, low threshold to stop ceftriaxone  Headaches -CT head negative and no focal neurodeficit on  exam. -Improved, likely secondary to URI   Persistent A-fib with RVR -Rate now improved to 90-100.   -Continue metoprolol and Xarelto.   Hypertension -Stable.  Continue amlodipine, metoprolol   hyperlipidemia -Continue Lipitor.   CKD stage II-IIIa -Renal function stable, continue to monitor.   Asthma Stable, no signs of acute exacerbation.  Continue Breo and Singulair and albuterol as needed.  On room air.  No wheezing noted on exam  QT prolongation: QTc 540 on EKG.  Continue cardiac monitoring and avoid QT prolonging drugs.  Replenish electrolytes.  Macrocytic anemia: H&H is stable. Continue to monitor  Thrombocytopenia: -Appears to be chronic.  Will continue to monitor  Hypokalemia and hypomagnesemia: Replenished  DVT prophylaxis: Xarelto Code Status: Full code Family Communication: Discussed with family friend RN at bedside Disposition Plan: Home     Subjective: -Feels better, headache is improving, breathing better   Objective: Vitals:   07/28/22 1951 07/29/22 0428 07/29/22 0828 07/29/22 0837  BP: 110/79 139/85 (!) 146/97 (!) 146/97  Pulse:  82 88   Resp: 20 17 18    Temp: 98 F (36.7 C) 99 F (37.2 C) 97.8 F (36.6 C)   TempSrc:  Oral Oral   SpO2: 98% 98% 99%   Weight:  70.4 kg    Height:        Intake/Output Summary (Last 24 hours) at 07/29/2022 0942 Last data filed at 07/29/2022 0518 Gross per 24 hour  Intake 841.04 ml  Output 2200 ml  Net -1358.96 ml   Filed Weights   07/27/22 2011 07/28/22 1442 07/29/22 0428  Weight: 78.9 kg 70.4 kg 70.4 kg    Examination:  Gen: Awake, Alert, Oriented X 2, cognitive deficits noted HEENT: +  JVD Lungs: Poor air movement bilaterally CVS: S1S2/RRR Abd: soft, Non tender, non distended, BS present Extremities: 1+ edema Skin: no new rashes on exposed skin    Data Reviewed: I have personally reviewed following labs and imaging studies  CBC: Recent Labs  Lab 07/24/22 0000 07/27/22 2014 07/28/22 0121  07/29/22 0229  WBC 2.6* 9.6 9.5 4.9  NEUTROABS  --  8.2*  --   --   HGB 11.4* 11.7* 11.1* 11.9*  HCT 34.6* 36.6 34.4* 35.8*  MCV 97.7 100.3* 100.3* 98.9  PLT 103* 111* 94* 89*   Basic Metabolic Panel: Recent Labs  Lab 07/24/22 0000 07/27/22 2014 07/28/22 0121 07/29/22 0229  NA 140 135 137 136  K 3.7 3.8 3.4* 3.6  CL 101 96* 99 98  CO2 29 29 29 30   GLUCOSE 85 125* 94 97  BUN 8 9 7* 10  CREATININE 0.77 0.91 0.82 0.86  CALCIUM 9.3 9.3 9.1 9.2  MG  --   --  1.9 2.0   GFR: Estimated Creatinine Clearance: 51.5 mL/min (by C-G formula based on SCr of 0.86 mg/dL). Liver Function Tests: Recent Labs  Lab 07/24/22 0000 07/27/22 2014 07/29/22 0229  AST 22 32 20  ALT 12 16 14   ALKPHOS  --  37* 38  BILITOT 0.5 1.4* 0.9  PROT 6.6 7.0 6.7  ALBUMIN  --  3.5 3.2*   No results for input(s): "LIPASE", "AMYLASE" in the last 168 hours. No results for input(s): "AMMONIA" in the last 168 hours. Coagulation Profile: Recent Labs  Lab 07/27/22 2014  INR 1.3*   Cardiac Enzymes: No results for input(s): "CKTOTAL", "CKMB", "CKMBINDEX", "TROPONINI" in the last 168 hours. BNP (last 3 results) No results for input(s): "PROBNP" in the last 8760 hours. HbA1C: No results for input(s): "HGBA1C" in the last 72 hours. CBG: No results for input(s): "GLUCAP" in the last 168 hours. Lipid Profile: No results for input(s): "CHOL", "HDL", "LDLCALC", "TRIG", "CHOLHDL", "LDLDIRECT" in the last 72 hours. Thyroid Function Tests: No results for input(s): "TSH", "T4TOTAL", "FREET4", "T3FREE", "THYROIDAB" in the last 72 hours. Anemia Panel: No results for input(s): "VITAMINB12", "FOLATE", "FERRITIN", "TIBC", "IRON", "RETICCTPCT" in the last 72 hours. Sepsis Labs: Recent Labs  Lab 07/27/22 2014 07/27/22 2245 07/28/22 0121  PROCALCITON  --   --  0.30  LATICACIDVEN 2.2* 1.0  --     Recent Results (from the past 240 hour(s))  Urine Culture     Status: None   Collection Time: 07/24/22 12:00 AM   Result Value Ref Range Status   MICRO NUMBER: [id number]  Final   SPECIMEN QUALITY: Adequate  Final   Sample Source NOT GIVEN  Final   STATUS: FINAL  Final   Result:   Final    Mixed genital flora isolated. These superficial bacteria are not indicative of a urinary tract infection. No further organism identification is warranted on this specimen. If clinically indicated, recollect clean-catch, mid-stream urine and transfer  immediately to Urine Culture Transport Tube.   Culture, blood (Routine x 2)     Status: None (Preliminary result)   Collection Time: 07/27/22  8:14 PM   Specimen: BLOOD  Result Value Ref Range Status   Specimen Description BLOOD LEFT ANTECUBITAL  Final   Special Requests   Final    BOTTLES DRAWN AEROBIC AND ANAEROBIC Blood Culture adequate volume   Culture   Final    NO GROWTH 2 DAYS Performed at [facility], [place] [address], [place], [place] [address]  Report Status PENDING  Incomplete  Culture, blood (Routine x 2)     Status: None (Preliminary result)   Collection Time: 07/27/22  8:20 PM   Specimen: BLOOD RIGHT HAND  Result Value Ref Range Status   Specimen Description BLOOD RIGHT HAND  Final   Special Requests   Final    BOTTLES DRAWN AEROBIC AND ANAEROBIC Blood Culture adequate volume   Culture   Final    NO GROWTH 2 DAYS Performed at [facility], [address], [place], [place] [address]    Report Status PENDING  Incomplete  Resp panel by RT-PCR (RSV, Flu A&B, Covid) Anterior Nasal Swab     Status: None   Collection Time: 07/27/22  9:56 PM   Specimen: Anterior Nasal Swab  Result Value Ref Range Status   SARS Coronavirus 2 by RT PCR NEGATIVE NEGATIVE Final   Influenza A by PCR NEGATIVE NEGATIVE Final   Influenza B by PCR NEGATIVE NEGATIVE Final    Comment: (NOTE) The Xpert Xpress SARS-CoV-2/FLU/RSV plus assay is intended as an aid in the diagnosis of influenza from Nasopharyngeal swab specimens and should not be used as a sole basis  for treatment. Nasal washings and aspirates are unacceptable for Xpert Xpress SARS-CoV-2/FLU/RSV testing.  Fact Sheet for Patients: EntrepreneurPulse.com.au  Fact Sheet for Healthcare Providers: IncredibleEmployment.be  This test is not yet approved or cleared by the [place] FDA and has been authorized for detection and/or diagnosis of SARS-CoV-2 by FDA under an Emergency Use Authorization (EUA). This EUA will remain in effect (meaning this test can be used) for the duration of the COVID-19 declaration under Section 564(b)(1) of the Act, 21 U.S.C. section 360bbb-3(b)(1), unless the authorization is terminated or revoked.     Resp Syncytial Virus by PCR NEGATIVE NEGATIVE Final    Comment: (NOTE) Fact Sheet for Patients: EntrepreneurPulse.com.au  Fact Sheet for Healthcare Providers: IncredibleEmployment.be  This test is not yet approved or cleared by the [place] FDA and has been authorized for detection and/or diagnosis of SARS-CoV-2 by FDA under an Emergency Use Authorization (EUA). This EUA will remain in effect (meaning this test can be used) for the duration of the COVID-19 declaration under Section 564(b)(1) of the Act, 21 U.S.C. section 360bbb-3(b)(1), unless the authorization is terminated or revoked.  Performed at [facility], [place] [address], [place], [place] [address]       Radiology Studies: ECHOCARDIOGRAM COMPLETE  Result Date: 07/28/2022    ECHOCARDIOGRAM REPORT   Patient Name:   RANEA RENEGAR Date of Exam: 07/28/2022 Medical Rec #:  [id number]              Height:       63.0 in Accession #:    [id number]             Weight:       174.0 lb Date of Birth:  [birth year]               BSA:          1.823 m Patient Age:    46 years               BP:           150/89 mmHg Patient Gender: F                      HR:           95 bpm. Exam Location:  Inpatient  Procedure: 2D Echo,  Cardiac Doppler and Color Doppler Indications:    CHF-Acute Diastolic [id number]  History:        Patient has prior history of Echocardiogram examinations, most                 recent 06/04/2020. CHF, Arrythmias:Atrial Fibrillation,                 Signs/Symptoms:Chest Pain and Dyspnea; Risk Factors:Hypertension                 and Dyslipidemia.  Sonographer:    Ronny Flurry Referring Phys: [id number] Alfred  1. Left ventricular ejection fraction, by estimation, is 60 to 65%. The left ventricle has normal function. The left ventricle has no regional wall motion abnormalities. Left ventricular diastolic parameters are indeterminate.  2. Right ventricular systolic function is normal. The right ventricular size is mildly enlarged. There is normal pulmonary artery systolic pressure.  3. Left atrial size was mild to moderately dilated.  4. Right atrial size was moderately dilated.  5. Trivial mitral valve regurgitation.  6. The aortic valve is grossly normal. Aortic valve regurgitation is not visualized.  7. The inferior vena cava is normal in size with greater than 50% respiratory variability, suggesting right atrial pressure of 3 mmHg. FINDINGS  Left Ventricle: Left ventricular ejection fraction, by estimation, is 60 to 65%. The left ventricle has normal function. The left ventricle has no regional wall motion abnormalities. The left ventricular internal cavity size was normal in size. There is  no left ventricular hypertrophy. Left ventricular diastolic parameters are indeterminate. Right Ventricle: The right ventricular size is mildly enlarged. Right ventricular systolic function is normal. There is normal pulmonary artery systolic pressure. The tricuspid regurgitant velocity is 2.58 m/s, and with an assumed right atrial pressure of 3 mmHg, the estimated right ventricular systolic pressure is 0000000 mmHg. Left Atrium: Left atrial size was mild to moderately dilated. Right Atrium: Right atrial size  was moderately dilated. Pericardium: There is no evidence of pericardial effusion. Mitral Valve: Trivial mitral valve regurgitation. Tricuspid Valve: Tricuspid valve regurgitation is mild. Aortic Valve: The aortic valve is grossly normal. Aortic valve regurgitation is not visualized. Aortic valve mean gradient measures 5.0 mmHg. Aortic valve peak gradient measures 8.3 mmHg. Aortic valve area, by VTI measures 2.30 cm. Pulmonic Valve: Pulmonic valve regurgitation is not visualized. Aorta: The aortic root and ascending aorta are structurally normal, with no evidence of dilitation. Venous: The inferior vena cava is normal in size with greater than 50% respiratory variability, suggesting right atrial pressure of 3 mmHg. IAS/Shunts: No atrial level shunt detected by color flow Doppler.  LEFT VENTRICLE PLAX 2D LVIDd:         4.80 cm   Diastology LVIDs:         3.30 cm   LV e' medial:    8.70 cm/s LV PW:         1.10 cm   LV E/e' medial:  12.5 LV IVS:        0.80 cm   LV e' lateral:   14.90 cm/s LVOT diam:     2.00 cm   LV E/e' lateral: 7.3 LV SV:         57 LV SV Index:   31 LVOT Area:     3.14 cm  RIGHT VENTRICLE             IVC RV S prime:     13.70 cm/s  IVC diam:  1.00 cm TAPSE (M-mode): 1.8 cm LEFT ATRIUM           Index        RIGHT ATRIUM           Index LA diam:      4.50 cm 2.47 cm/m   RA Area:     25.40 cm LA Vol (A2C): 53.6 ml 29.41 ml/m  RA Volume:   82.80 ml  45.43 ml/m LA Vol (A4C): 58.1 ml 31.88 ml/m  AORTIC VALVE AV Area (Vmax):    2.48 cm AV Area (Vmean):   2.34 cm AV Area (VTI):     2.30 cm AV Vmax:           144.00 cm/s AV Vmean:          102.000 cm/s AV VTI:            0.249 m AV Peak Grad:      8.3 mmHg AV Mean Grad:      5.0 mmHg LVOT Vmax:         113.67 cm/s LVOT Vmean:        75.833 cm/s LVOT VTI:          0.182 m LVOT/AV VTI ratio: 0.73  AORTA Ao Root diam: 3.50 cm Ao Asc diam:  3.10 cm MV E velocity: 109.00 cm/s  TRICUSPID VALVE                             TR Peak grad:   26.6 mmHg                              TR Vmax:        258.00 cm/s                              SHUNTS                             Systemic VTI:  0.18 m                             Systemic Diam: 2.00 cm Phineas Inches Electronically signed by Phineas Inches Signature Date/Time: 07/28/2022/2:14:13 PM    Final    CT Head Wo Contrast  Result Date: 07/27/2022 CLINICAL DATA:  Mental status change EXAM: CT HEAD WITHOUT CONTRAST TECHNIQUE: Contiguous axial images were obtained from the base of the skull through the vertex without intravenous contrast. RADIATION DOSE REDUCTION: This exam was performed according to the departmental dose-optimization program which includes automated exposure control, adjustment of the mA and/or kV according to patient size and/or use of iterative reconstruction technique. COMPARISON:  CT brain 06/27/2018 FINDINGS: Brain: No acute territorial infarction, hemorrhage, or intracranial. Minimal white matter hypodensity likely chronic small vessel ischemic change. Nonenlarged ventricles Vascular: No hyperdense vessels.  No unexpected calcification Skull: Normal. Negative for fracture or focal lesion. Sinuses/Orbits: No acute finding. Other: None IMPRESSION: 1. No CT evidence for acute intracranial abnormality. 2. Minimal chronic small vessel ischemic changes of the white matter. Electronically Signed   By: Donavan Foil M.D.   On: 07/27/2022 21:00   DG Chest 2 View  Result Date: 07/27/2022 CLINICAL DATA:  Suspected sepsis. EXAM: CHEST - 2 VIEW COMPARISON:  05/26/2020 FINDINGS: The  cardio pericardial silhouette is enlarged. Low volume film. Vascular congestion with diffuse interstitial opacity suggests edema. Bones are diffusely demineralized. Telemetry leads overlie the chest. IMPRESSION: Low volume film with vascular congestion and diffuse interstitial opacity suggesting edema. Electronically Signed   By: Misty Stanley M.D.   On: 07/27/2022 20:46    Scheduled Meds:  amLODipine  5 mg Oral BH-q7a    atorvastatin  20 mg Oral Daily   fluticasone furoate-vilanterol  1 puff Inhalation Daily   furosemide  40 mg Intravenous Q12H   metoprolol succinate  50 mg Oral Daily   montelukast  10 mg Oral QHS   Rivaroxaban  15 mg Oral q AM   Continuous Infusions:  cefTRIAXone (ROCEPHIN)  IV Stopped (07/28/22 2225)     LOS: 1 day   Time spent: 35 minutes   Domenic Polite, MD [facility]  If 7PM-7AM, please contact night-coverage [email or web address] 07/29/2022, 9:42 AM

## 2022-07-29 NOTE — Progress Notes (Signed)
Heart Failure Navigator Progress Note  Assessed for Heart & Vascular TOC clinic readiness.  Patient does not meet criteria due to [facility] patient.   Navigator will sign off at this time.    Cilicia Borden, BSN, RN Heart Failure Nurse Navigator Secure Chat Only   

## 2022-07-29 NOTE — Care Management (Signed)
  Transition of Care [facility]) Screening Note   Patient Details  Name: Morgan Bowers Date of Birth: Sep 12, 1945   Transition of [facility]) CM/SW Contact:    Bethena Roys, RN Phone Number: 07/29/2022, 3:25 PM    Transition of Care Department [facility]) has reviewed the patient and no TOC needs have been identified at this time. We will continue to monitor patient advancement through interdisciplinary progression rounds. If new patient transition needs arise, please place a TOC consult.

## 2022-07-30 ENCOUNTER — Telehealth (HOSPITAL_COMMUNITY): Payer: Self-pay | Admitting: Pharmacy Technician

## 2022-07-30 ENCOUNTER — Other Ambulatory Visit (HOSPITAL_COMMUNITY): Payer: Self-pay

## 2022-07-30 LAB — BASIC METABOLIC PANEL
Anion gap: 10 (ref 5–15)
BUN: 18 mg/dL (ref 8–23)
CO2: 28 mmol/L (ref 22–32)
Calcium: 9.4 mg/dL (ref 8.9–10.3)
Chloride: 95 mmol/L — ABNORMAL LOW (ref 98–111)
Creatinine, Ser: 1.07 mg/dL — ABNORMAL HIGH (ref 0.44–1.00)
GFR, Estimated: 53 mL/min — ABNORMAL LOW (ref >60)
Glucose, Bld: 98 mg/dL (ref 70–99)
Potassium: 4.3 mmol/L (ref 3.5–5.1)
Sodium: 133 mmol/L — ABNORMAL LOW (ref 135–145)

## 2022-07-30 MED ORDER — EMPAGLIFLOZIN 10 MG PO TABS: 10.0000 mg | ORAL_TABLET | Freq: Every day | ORAL | 0 refills | Status: DC

## 2022-07-30 MED ORDER — EMPAGLIFLOZIN 10 MG PO TABS
10.0000 mg | ORAL_TABLET | Freq: Every day | ORAL | 0 refills | Status: DC
  Filled 2022-07-30: qty 30, 30d supply, fill #0

## 2022-07-30 MED ORDER — FUROSEMIDE 40 MG PO TABS
40.0000 mg | ORAL_TABLET | Freq: Every day | ORAL | Status: DC
  Administered 2022-07-30: 40 mg via ORAL
  Filled 2022-07-30: qty 1

## 2022-07-30 NOTE — Progress Notes (Signed)
Mobility Specialist Progress Note:   07/30/22 1000  Mobility  Activity Ambulated with assistance in hallway  Level of Assistance Contact guard assist, steadying assist  Assistive Device Other (Comment) (HHA)  Distance Ambulated (ft) 400 ft  Activity Response Tolerated well  Mobility Referral Yes  $Mobility charge 1 Mobility   Pt agreeable to mobility session. Required steadying assist via HHA throughout ambulation. Pt back sitting EOB. No c/o SOB throughout session. All needs met.   Nelta Numbers Mobility Specialist Please contact via SecureChat or  Rehab office at [phone]

## 2022-07-30 NOTE — Telephone Encounter (Signed)
Patient Advocate Encounter  Prior Authorization for Jardiance 10MG  tablets  has been approved.    PA# [id number] Insurance [facility] Medicaid of [place] Prior Authorization Request Form Effective dates: 07/30/2022 through 07/30/2023  Patients co-pay is $4.00.     Lyndel Safe, [place] Patient Advocate Specialist [place] Patient Advocate Team Direct Number: [phone]  Fax: [phone]

## 2022-07-30 NOTE — Telephone Encounter (Signed)
Patient Advocate Encounter   Received notification that prior authorization for Jardiance 10MG  tablets is required.   PA submitted on 07/30/2022 [place] Medicaid of [place] Prior Authorization Request Form Status is pending       Lyndel Safe, [place] Patient Advocate Specialist [place] Patient Advocate Team Direct Number: [phone]  Fax: [phone]

## 2022-07-30 NOTE — Progress Notes (Signed)
Resp pathogen panel NEGATIVE, per Dr Sidney Ace, will d/c droplet precautions.

## 2022-07-30 NOTE — Progress Notes (Signed)
BP in Left arm this am 126/90, BP in Right arm 147/99. Pt concerned/tearful that BP is elevated. AM dose of Norvasc given as scheduled and emotional support given to pt.

## 2022-07-31 NOTE — Discharge Summary (Addendum)
Physician Discharge Summary  Morgan Bowers [id number] DOB: Feb 25, 1946 DOA: 07/27/2022  PCP: Nolene Ebbs, MD  Admit date: 07/27/2022 Discharge date: 07/30/2022  Time spent: 35 minutes  Recommendations for Outpatient Follow-up:  Cardiology Dr. Einar Gip in 1 to 2 weeks [place] for chronic headaches   Discharge Diagnoses:  Principal Problem:   Acute on chronic heart failure with preserved ejection fraction Active Problems:   Essential hypertension   Persistent atrial fibrillation with rapid ventricular response (HCC)   Sepsis (HCC)   Chest pain   Headache   HLD (hyperlipidemia)   Acute on chronic heart failure with preserved ejection fraction [facility])   Discharge Condition: Improved  Diet recommendation: Low-sodium, heart healthy  Filed Weights   07/28/22 1442 07/29/22 0428 07/30/22 0544  Weight: 70.4 kg 70.4 kg 71.6 kg    History of present illness:  77 y.o. female with medical history significant of persistent A-fib on Xarelto, history of PFO closure in 2018, hypertension, hyperlipidemia, pancytopenia, CKD stage IIIa, chronic HFpEF, asthma, GERD, anemia, arthritis presented to urgent care with headaches, chest pain, palpitations, and generalized weakness.  Noted to be in A-fib with RVR with rate in the 140s and sent to the ED for further evaluation.  Patient was given 500 cc IV fluids by EMS.  In the ED, patient noted to be febrile with temperature 100.4 F.  Labs showing no leukocytosis, hemoglobin 11.7 (stable), platelet count 111k (stable), no significant electrolyte abnormalities, UA without signs of infection, lactic acid 2.2> 1.0, blood cultures drawn, troponin negative, COVID/influenza/RSV PCR negative.  Chest x-ray showing vascular congestion and diffuse interstitial opacities concerning for pulmonary edema.  CT head negative for acute finding. Patient received Reglan and 500 cc IV fluids in the ED.   Hospital Course:   Acute on chronic HFpEF -Was volume  overloaded on admission chest x-ray showing vascular congestion and diffuse interstitial opacities concerning for pulmonary edema.   - last echo done in January 2022 showing EF 60 to 65%, mild mitral regurgitation.   -Improved improved with diuretics, 3.4 L negative, creatinine stable, transitioned to oral diuretics, started on Jardiance discharged home in stable condition   URI, low-grade fever -Likely viral URI, sepsis ruled out -Resolved   Headaches -CT head negative and no focal neurodeficit on exam.  No culprit meds noted -Improved, however chronic intermittent problem, daughter requests neurology referral for this, sent   Persistent A-fib with RVR -Rate now improved to 90-100.   -Continue metoprolol and Xarelto.   Hypertension -Stable.  Continue amlodipine, metoprolol    hyperlipidemia -Continue Lipitor.   CKD stage II-IIIa -Renal function stable, continue to monitor.   Asthma Stable, no signs of acute exacerbation.  Continue Breo and Singulair and albuterol as needed.  On room air.  No wheezing noted on exam   QT prolongation: QTc 540 on EKG.  Continue cardiac monitoring and avoid QT prolonging drugs.  Replenish electrolytes.   Macrocytic anemia: H&H is stable. Continue to monitor   Thrombocytopenia: -Appears to be chronic.  Will continue to monitor   Hypokalemia and hypomagnesemia: Replenished    Discharge Exam: Vitals:   07/30/22 0819 07/30/22 0925  BP: 97/68 114/75  Pulse:    Resp:    Temp:    SpO2:     Gen: Awake, Alert, Oriented X 2, cognitive deficits noted HEENT: no JVD Lungs: Poor air movement bilaterally CVS: S1S2/RRR Abd: soft, Non tender, non distended, BS present Extremities: trace edema Skin: no new rashes on exposed skin  Discharge Instructions   Discharge Instructions     Ambulatory referral to Neurology   Complete by: As directed    An appointment is requested in approximately: 4 weeks   Diet - low sodium heart healthy    Complete by: As directed    Increase activity slowly   Complete by: As directed       Allergies as of 07/30/2022   No Known Allergies      Medication List     STOP taking these medications    amLODipine 5 MG tablet Commonly known as: NORVASC   losartan 50 MG tablet Commonly known as: COZAAR   nitrofurantoin (macrocrystal-monohydrate) 100 MG capsule Commonly known as: MACROBID       TAKE these medications    acetaminophen 325 MG tablet Commonly known as: TYLENOL Take 650 mg by mouth every 6 (six) hours as needed for moderate pain or headache.   atorvastatin 20 MG tablet Commonly known as: LIPITOR TAKE 1 TABLET BY MOUTH EVERY DAY   ferrous sulfate 325 (65 FE) MG tablet Take 325 mg by mouth daily.   fluticasone furoate-vilanterol 100-25 MCG/ACT Aepb Commonly known as: Breo Ellipta Inhale 1 puff into the lungs daily.   furosemide 20 MG tablet Commonly known as: LASIX Take 1 tablet (20 mg total) by mouth every morning.   Jardiance 10 MG Tabs tablet Generic drug: empagliflozin Take 1 tablet (10 mg total) by mouth daily.   meclizine 25 MG tablet Commonly known as: ANTIVERT Take 25 mg by mouth every 8 (eight) hours as needed for dizziness.   metoprolol succinate 50 MG 24 hr tablet Commonly known as: TOPROL-XL TAKE 1 TABLET BY MOUTH DAILY. TAKE WITH OR IMMEDIATELY FOLLOWING A MEAL. What changed: additional instructions   montelukast 10 MG tablet Commonly known as: SINGULAIR Take 10 mg by mouth at bedtime.   potassium chloride 10 MEQ tablet Commonly known as: KLOR-CON Take 1 tablet (10 mEq total) by mouth daily.   albuterol (2.5 MG/3ML) 0.083% nebulizer solution Commonly known as: PROVENTIL Take 2.5 mg by nebulization every 6 (six) hours as needed for wheezing or shortness of breath.   ProAir HFA 108 (90 Base) MCG/ACT inhaler Generic drug: albuterol Inhale 2 puffs into the lungs 4 (four) times daily as needed for shortness of breath or wheezing.    Restasis 0.05 % ophthalmic emulsion Generic drug: cycloSPORINE Place 1 drop into both eyes 2 (two) times daily as needed (dry eyes).   Vitamin D (Ergocalciferol) 1.25 MG (50000 UNIT) Caps capsule Commonly known as: DRISDOL Take 50,000 Units by mouth every Wednesday.   Xarelto 15 MG Tabs tablet Generic drug: Rivaroxaban TAKE 1 TABLET (15 MG TOTAL) BY MOUTH DAILY WITH SUPPER What changed: See the new instructions.       No Known Allergies    The results of significant diagnostics from this hospitalization (including imaging, microbiology, ancillary and laboratory) are listed below for reference.    Significant Diagnostic Studies: ECHOCARDIOGRAM COMPLETE  Result Date: 07/28/2022    ECHOCARDIOGRAM REPORT   Patient Name:   DEANINE VANDERBURGH Date of Exam: 07/28/2022 Medical Rec #:  [id number]              Height:       63.0 in Accession #:    [id number]             Weight:       174.0 lb Date of Birth:  07-Jun-1945  BSA:          1.823 m Patient Age:    50 years               BP:           150/89 mmHg Patient Gender: F                      HR:           95 bpm. Exam Location:  Inpatient Procedure: 2D Echo, Cardiac Doppler and Color Doppler Indications:    CHF-Acute Diastolic [id number]  History:        Patient has prior history of Echocardiogram examinations, most                 recent 06/04/2020. CHF, Arrythmias:Atrial Fibrillation,                 Signs/Symptoms:Chest Pain and Dyspnea; Risk Factors:Hypertension                 and Dyslipidemia.  Sonographer:    Ronny Flurry Referring Phys: [id number] [place]  1. Left ventricular ejection fraction, by estimation, is 60 to 65%. The left ventricle has normal function. The left ventricle has no regional wall motion abnormalities. Left ventricular diastolic parameters are indeterminate.  2. Right ventricular systolic function is normal. The right ventricular size is mildly enlarged. There is normal pulmonary artery  systolic pressure.  3. Left atrial size was mild to moderately dilated.  4. Right atrial size was moderately dilated.  5. Trivial mitral valve regurgitation.  6. The aortic valve is grossly normal. Aortic valve regurgitation is not visualized.  7. The inferior vena cava is normal in size with greater than 50% respiratory variability, suggesting right atrial pressure of 3 mmHg. FINDINGS  Left Ventricle: Left ventricular ejection fraction, by estimation, is 60 to 65%. The left ventricle has normal function. The left ventricle has no regional wall motion abnormalities. The left ventricular internal cavity size was normal in size. There is  no left ventricular hypertrophy. Left ventricular diastolic parameters are indeterminate. Right Ventricle: The right ventricular size is mildly enlarged. Right ventricular systolic function is normal. There is normal pulmonary artery systolic pressure. The tricuspid regurgitant velocity is 2.58 m/s, and with an assumed right atrial pressure of 3 mmHg, the estimated right ventricular systolic pressure is 0000000 mmHg. Left Atrium: Left atrial size was mild to moderately dilated. Right Atrium: Right atrial size was moderately dilated. Pericardium: There is no evidence of pericardial effusion. Mitral Valve: Trivial mitral valve regurgitation. Tricuspid Valve: Tricuspid valve regurgitation is mild. Aortic Valve: The aortic valve is grossly normal. Aortic valve regurgitation is not visualized. Aortic valve mean gradient measures 5.0 mmHg. Aortic valve peak gradient measures 8.3 mmHg. Aortic valve area, by VTI measures 2.30 cm. Pulmonic Valve: Pulmonic valve regurgitation is not visualized. Aorta: The aortic root and ascending aorta are structurally normal, with no evidence of dilitation. Venous: The inferior vena cava is normal in size with greater than 50% respiratory variability, suggesting right atrial pressure of 3 mmHg. IAS/Shunts: No atrial level shunt detected by color flow Doppler.   LEFT VENTRICLE PLAX 2D LVIDd:         4.80 cm   Diastology LVIDs:         3.30 cm   LV e' medial:    8.70 cm/s LV PW:         1.10 cm   LV E/e' medial:  12.5  LV IVS:        0.80 cm   LV e' lateral:   14.90 cm/s LVOT diam:     2.00 cm   LV E/e' lateral: 7.3 LV SV:         57 LV SV Index:   31 LVOT Area:     3.14 cm  RIGHT VENTRICLE             IVC RV S prime:     13.70 cm/s  IVC diam: 1.00 cm TAPSE (M-mode): 1.8 cm LEFT ATRIUM           Index        RIGHT ATRIUM           Index LA diam:      4.50 cm 2.47 cm/m   RA Area:     25.40 cm LA Vol (A2C): 53.6 ml 29.41 ml/m  RA Volume:   82.80 ml  45.43 ml/m LA Vol (A4C): 58.1 ml 31.88 ml/m  AORTIC VALVE AV Area (Vmax):    2.48 cm AV Area (Vmean):   2.34 cm AV Area (VTI):     2.30 cm AV Vmax:           144.00 cm/s AV Vmean:          102.000 cm/s AV VTI:            0.249 m AV Peak Grad:      8.3 mmHg AV Mean Grad:      5.0 mmHg LVOT Vmax:         113.67 cm/s LVOT Vmean:        75.833 cm/s LVOT VTI:          0.182 m LVOT/AV VTI ratio: 0.73  AORTA Ao Root diam: 3.50 cm Ao Asc diam:  3.10 cm MV E velocity: 109.00 cm/s  TRICUSPID VALVE                             TR Peak grad:   26.6 mmHg                             TR Vmax:        258.00 cm/s                              SHUNTS                             Systemic VTI:  0.18 m                             Systemic Diam: 2.00 cm Phineas Inches Electronically signed by Phineas Inches Signature Date/Time: 07/28/2022/2:14:13 PM    Final    CT Head Wo Contrast  Result Date: 07/27/2022 CLINICAL DATA:  Mental status change EXAM: CT HEAD WITHOUT CONTRAST TECHNIQUE: Contiguous axial images were obtained from the base of the skull through the vertex without intravenous contrast. RADIATION DOSE REDUCTION: This exam was performed according to the departmental dose-optimization program which includes automated exposure control, adjustment of the mA and/or kV according to patient size and/or use of iterative reconstruction technique.  COMPARISON:  CT brain 06/27/2018 FINDINGS: Brain: No acute territorial infarction, hemorrhage, or intracranial. Minimal white matter hypodensity likely chronic  small vessel ischemic change. Nonenlarged ventricles Vascular: No hyperdense vessels.  No unexpected calcification Skull: Normal. Negative for fracture or focal lesion. Sinuses/Orbits: No acute finding. Other: None IMPRESSION: 1. No CT evidence for acute intracranial abnormality. 2. Minimal chronic small vessel ischemic changes of the white matter. Electronically Signed   By: Donavan Foil M.D.   On: 07/27/2022 21:00   DG Chest 2 View  Result Date: 07/27/2022 CLINICAL DATA:  Suspected sepsis. EXAM: CHEST - 2 VIEW COMPARISON:  05/26/2020 FINDINGS: The cardio pericardial silhouette is enlarged. Low volume film. Vascular congestion with diffuse interstitial opacity suggests edema. Bones are diffusely demineralized. Telemetry leads overlie the chest. IMPRESSION: Low volume film with vascular congestion and diffuse interstitial opacity suggesting edema. Electronically Signed   By: Misty Stanley M.D.   On: 07/27/2022 20:46    Microbiology: Recent Results (from the past 240 hour(s))  Urine Culture     Status: None   Collection Time: 07/24/22 12:00 AM  Result Value Ref Range Status   MICRO NUMBER: [id number]  Final   SPECIMEN QUALITY: Adequate  Final   Sample Source NOT GIVEN  Final   STATUS: FINAL  Final   Result:   Final    Mixed genital flora isolated. These superficial bacteria are not indicative of a urinary tract infection. No further organism identification is warranted on this specimen. If clinically indicated, recollect clean-catch, mid-stream urine and transfer  immediately to Urine Culture Transport Tube.   Urine Culture (for pregnant, neutropenic or urologic patients or patients with an indwelling urinary catheter)     Status: Abnormal   Collection Time: 07/27/22  8:06 PM   Specimen: Urine, Clean Catch  Result Value Ref Range Status    Specimen Description URINE, CLEAN CATCH  Final   Special Requests NONE  Final   Culture (A)  Final    <10,000 COLONIES/mL INSIGNIFICANT GROWTH Performed at [facility], [address], [place], [place] [address]    Report Status 07/29/2022 FINAL  Final  Culture, blood (Routine x 2)     Status: None (Preliminary result)   Collection Time: 07/27/22  8:14 PM   Specimen: BLOOD  Result Value Ref Range Status   Specimen Description BLOOD LEFT ANTECUBITAL  Final   Special Requests   Final    BOTTLES DRAWN AEROBIC AND ANAEROBIC Blood Culture adequate volume   Culture   Final    NO GROWTH 4 DAYS Performed at [facility], [place] [address] [place], [place] [address]    Report Status PENDING  Incomplete  Culture, blood (Routine x 2)     Status: None (Preliminary result)   Collection Time: 07/27/22  8:20 PM   Specimen: BLOOD RIGHT HAND  Result Value Ref Range Status   Specimen Description BLOOD RIGHT HAND  Final   Special Requests   Final    BOTTLES DRAWN AEROBIC AND ANAEROBIC Blood Culture adequate volume   Culture   Final    NO GROWTH 4 DAYS Performed at [facility], [place] [address] [place], [place] [address]    Report Status PENDING  Incomplete  Resp panel by RT-PCR (RSV, Flu A&B, Covid) Anterior Nasal Swab     Status: None   Collection Time: 07/27/22  9:56 PM   Specimen: Anterior Nasal Swab  Result Value Ref Range Status   SARS Coronavirus 2 by RT PCR NEGATIVE NEGATIVE Final   Influenza A by PCR NEGATIVE NEGATIVE Final   Influenza B by PCR NEGATIVE NEGATIVE  Final    Comment: (NOTE) The Xpert Xpress SARS-CoV-2/FLU/RSV plus assay is intended as an aid in the diagnosis of influenza from Nasopharyngeal swab specimens and should not be used as a sole basis for treatment. Nasal washings and aspirates are unacceptable for Xpert Xpress SARS-CoV-2/FLU/RSV testing.  Fact Sheet for Patients: EntrepreneurPulse.com.au  Fact Sheet for Healthcare  Providers: IncredibleEmployment.be  This test is not yet approved or cleared by the [place] FDA and has been authorized for detection and/or diagnosis of SARS-CoV-2 by FDA under an Emergency Use Authorization (EUA). This EUA will remain in effect (meaning this test can be used) for the duration of the COVID-19 declaration under Section 564(b)(1) of the Act, 21 U.S.C. section 360bbb-3(b)(1), unless the authorization is terminated or revoked.     Resp Syncytial Virus by PCR NEGATIVE NEGATIVE Final    Comment: (NOTE) Fact Sheet for Patients: EntrepreneurPulse.com.au  Fact Sheet for Healthcare Providers: IncredibleEmployment.be  This test is not yet approved or cleared by the [place] FDA and has been authorized for detection and/or diagnosis of SARS-CoV-2 by FDA under an Emergency Use Authorization (EUA). This EUA will remain in effect (meaning this test can be used) for the duration of the COVID-19 declaration under Section 564(b)(1) of the Act, 21 U.S.C. section 360bbb-3(b)(1), unless the authorization is terminated or revoked.  Performed at [facility], [place] [address], [place], [place] [address]   Respiratory (~20 pathogens) panel by PCR     Status: None   Collection Time: 07/29/22 11:48 AM   Specimen: Nasopharyngeal Swab; Respiratory  Result Value Ref Range Status   Adenovirus NOT DETECTED NOT DETECTED Final   Coronavirus 229E NOT DETECTED NOT DETECTED Final    Comment: (NOTE) The Coronavirus on the Respiratory Panel, DOES NOT test for the novel  Coronavirus (2019 nCoV)    Coronavirus HKU1 NOT DETECTED NOT DETECTED Final   Coronavirus NL63 NOT DETECTED NOT DETECTED Final   Coronavirus OC43 NOT DETECTED NOT DETECTED Final   Metapneumovirus NOT DETECTED NOT DETECTED Final   Rhinovirus / Enterovirus NOT DETECTED NOT DETECTED Final   Influenza A NOT DETECTED NOT DETECTED Final   Influenza B NOT DETECTED  NOT DETECTED Final   Parainfluenza Virus 1 NOT DETECTED NOT DETECTED Final   Parainfluenza Virus 2 NOT DETECTED NOT DETECTED Final   Parainfluenza Virus 3 NOT DETECTED NOT DETECTED Final   Parainfluenza Virus 4 NOT DETECTED NOT DETECTED Final   Respiratory Syncytial Virus NOT DETECTED NOT DETECTED Final   Bordetella pertussis NOT DETECTED NOT DETECTED Final   Bordetella Parapertussis NOT DETECTED NOT DETECTED Final   Chlamydophila pneumoniae NOT DETECTED NOT DETECTED Final   Mycoplasma pneumoniae NOT DETECTED NOT DETECTED Final    Comment: Performed at [facility], [place]. [address] [place], [place] [address]     Labs: Basic Metabolic Panel: Recent Labs  Lab 07/27/22 2014 07/28/22 0121 07/29/22 0229 07/30/22 0206  NA 135 137 136 133*  K 3.8 3.4* 3.6 4.3  CL 96* 99 98 95*  CO2 29 29 30 28   GLUCOSE 125* 94 97 98  BUN 9 7* 10 18  CREATININE 0.91 0.82 0.86 1.07*  CALCIUM 9.3 9.1 9.2 9.4  MG  --  1.9 2.0  --    Liver Function Tests: Recent Labs  Lab 07/27/22 2014 07/29/22 0229  AST 32 20  ALT 16 14  ALKPHOS 37* 38  BILITOT 1.4* 0.9  PROT 7.0 6.7  ALBUMIN 3.5 3.2*   No results for  input(s): "LIPASE", "AMYLASE" in the last 168 hours. No results for input(s): "AMMONIA" in the last 168 hours. CBC: Recent Labs  Lab 07/27/22 2014 07/28/22 0121 07/29/22 0229  WBC 9.6 9.5 4.9  NEUTROABS 8.2*  --   --   HGB 11.7* 11.1* 11.9*  HCT 36.6 34.4* 35.8*  MCV 100.3* 100.3* 98.9  PLT 111* 94* 89*   Cardiac Enzymes: No results for input(s): "CKTOTAL", "CKMB", "CKMBINDEX", "TROPONINI" in the last 168 hours. BNP: BNP (last 3 results) Recent Labs    07/24/22 0000 07/28/22 0121  BNP 169* 205.4*    ProBNP (last 3 results) No results for input(s): "PROBNP" in the last 8760 hours.  CBG: No results for input(s): "GLUCAP" in the last 168 hours.     Signed:  Domenic Polite MD.  [facility] 07/31/2022, 11:58 AM

## 2022-08-01 LAB — CULTURE, BLOOD (ROUTINE X 2)
Culture: NO GROWTH
Culture: NO GROWTH
Special Requests: ADEQUATE
Special Requests: ADEQUATE

## 2022-08-02 ENCOUNTER — Ambulatory Visit: Payer: Medicaid Other

## 2022-08-02 DIAGNOSIS — I4819 Other persistent atrial fibrillation: Secondary | ICD-10-CM

## 2022-08-02 DIAGNOSIS — I5033 Acute on chronic diastolic (congestive) heart failure: Secondary | ICD-10-CM

## 2022-08-07 ENCOUNTER — Other Ambulatory Visit: Payer: Self-pay | Admitting: Internal Medicine

## 2022-08-08 LAB — CBC
HCT: 35.2 % (ref 35.0–45.0)
Hemoglobin: 11.8 g/dL (ref 11.7–15.5)
MCH: 32.5 pg (ref 27.0–33.0)
MCHC: 33.5 g/dL (ref 32.0–36.0)
MCV: 97 fL (ref 80.0–100.0)
MPV: 10.5 fL (ref 7.5–12.5)
Platelets: 164 10*3/uL (ref 140–400)
RBC: 3.63 10*6/uL — ABNORMAL LOW (ref 3.80–5.10)
RDW: 12.1 % (ref 11.0–15.0)
WBC: 3.1 10*3/uL — ABNORMAL LOW (ref 3.8–10.8)

## 2022-08-08 LAB — BASIC METABOLIC PANEL WITH GFR
BUN: 10 mg/dL (ref 7–25)
CO2: 34 mmol/L — ABNORMAL HIGH (ref 20–32)
Calcium: 9.9 mg/dL (ref 8.6–10.4)
Chloride: 98 mmol/L (ref 98–110)
Creat: 0.92 mg/dL (ref 0.60–1.00)
Glucose, Bld: 78 mg/dL (ref 65–99)
Potassium: 4.4 mmol/L (ref 3.5–5.3)
Sodium: 137 mmol/L (ref 135–146)
eGFR: 64 mL/min/{1.73_m2} (ref >=60)

## 2022-08-16 ENCOUNTER — Other Ambulatory Visit: Payer: Self-pay | Admitting: Internal Medicine

## 2022-08-16 DIAGNOSIS — Z1231 Encounter for screening mammogram for malignant neoplasm of breast: Secondary | ICD-10-CM

## 2022-09-03 ENCOUNTER — Encounter: Payer: Self-pay | Admitting: Cardiology

## 2022-09-03 ENCOUNTER — Ambulatory Visit: Payer: Medicaid Other | Admitting: Cardiology

## 2022-09-03 VITALS — BP 120/78 | HR 62 | Ht 63.0 in | Wt 160.2 lb

## 2022-09-03 DIAGNOSIS — N1832 Chronic kidney disease, stage 3b: Secondary | ICD-10-CM

## 2022-09-03 DIAGNOSIS — I5033 Acute on chronic diastolic (congestive) heart failure: Secondary | ICD-10-CM

## 2022-09-03 DIAGNOSIS — I4821 Permanent atrial fibrillation: Secondary | ICD-10-CM

## 2022-09-03 DIAGNOSIS — I10 Essential (primary) hypertension: Secondary | ICD-10-CM

## 2022-09-03 MED ORDER — LOSARTAN POTASSIUM-HCTZ 50-12.5 MG PO TABS: 1.0000 | ORAL_TABLET | ORAL | 1 refills | Status: DC

## 2022-09-03 NOTE — Progress Notes (Signed)
Primary Physician/Referring:  Fleet Contras, MD  Patient ID: Morgan Bowers, female    DOB: 1945-12-18, 77 y.o.   MRN: [id number]  Chief Complaint  Patient presents with   Persistent atrial fibrillation [facility])   Follow-up   HPI:    Morgan Bowers  is a 77 y.o. with h/o mild pancytopenia, negative GI work up in April 2019,  paroxysmal atrial fibrillation with history of cardioversion in 2018, history of PFO closure in 2018, hypertension, hyperlipidemia presents for annual visit.  Patient was seen by me on 07/22/2022 for acute decompensated heart failure, however she presented 5 days later to the emergency room with SIRS, losartan was discontinued due to mild renal insufficiency and started on furosemide 40 mg daily and eventually discharged home 3 days later. Daughter present.   Patient states that her dyspnea is improved now and she has not had any further leg edema.  Denies chest pain or palpitations.  She is tolerating all her medications well.  She brings all the medications with her today.  Past Medical History:  Diagnosis Date   A-fib    Anemia    Arthritis    "knees" (09/03/2016)   Asthma    GERD (gastroesophageal reflux disease)    High cholesterol    Hypertension     Social History   Tobacco Use   Smoking status: Never   Smokeless tobacco: Never  Substance Use Topics   Alcohol use: No   ROS  Review of Systems  Cardiovascular:  Positive for dyspnea on exertion. Negative for chest pain and leg swelling.  Respiratory:  Negative for wheezing.   Gastrointestinal:  Negative for melena.   Objective  Blood pressure 120/78, pulse 62, height 5\' 3"  (1.6 m), weight 160 lb 3.2 oz (72.7 kg), SpO2 96 %.     09/03/2022   11:12 AM 07/30/2022    9:25 AM 07/30/2022    8:19 AM  Vitals with BMI  Height 5\' 3"     Weight 160 lbs 3 oz    BMI 28.39    Systolic 120 114 97  Diastolic 78 75 68  Pulse 62      Physical Exam Neck:     Vascular: JVD present. No carotid  bruit.  Cardiovascular:     Rate and Rhythm: Normal rate. Rhythm irregular.     Pulses: Intact distal pulses.          Dorsalis pedis pulses are 2+ on the right side and 2+ on the left side.       Posterior tibial pulses are 2+ on the right side and 2+ on the left side.     Heart sounds: Murmur heard.     Midsystolic murmur is present with a grade of 2/6 at the apex.  Pulmonary:     Effort: Pulmonary effort is normal.     Breath sounds: Examination of the right-lower field reveals rhonchi. Examination of the left-lower field reveals rhonchi. Rhonchi present.  Abdominal:     General: Bowel sounds are normal.     Palpations: Abdomen is soft.  Musculoskeletal:     Right lower leg: Edema (Trace) present.     Left lower leg: Edema (Trace) present.    Laboratory examination:   Lab Results  Component Value Date   NA 137 08/07/2022   K 4.4 08/07/2022   CO2 34 (H) 08/07/2022   GLUCOSE 78 08/07/2022   BUN 10 08/07/2022   CREATININE 0.92 08/07/2022   CALCIUM 9.9 08/07/2022  EGFR 64 08/07/2022   GFRNONAA 53 (L) 07/30/2022       Latest Ref Rng & Units 08/07/2022   12:23 PM 07/30/2022    2:06 AM 07/29/2022    2:29 AM  BMP  Glucose 65 - 99 mg/dL 78  98  97   BUN 7 - 25 mg/dL Creatinine 0.60 - 1.00 mg/dL 3.24  4.01  0.27   BUN/Creat Ratio 6 - 22 (calc) SEE NOTE:     Sodium 135 - 146 mmol/L 137  133  136   Potassium 3.5 - 5.3 mmol/L 4.4  4.3  3.6   Chloride 98 - 110 mmol/L 98  95  98   CO2 20 - 32 mmol/L 34  28  30   Calcium 8.6 - 10.4 mg/dL 9.9  9.4  9.2     Lab Results  Component Value Date   TSH 0.96 07/24/2022    Lab Results  Component Value Date   CHOL 129 07/24/2022   HDL 69 07/24/2022   LDLCALC 47 07/24/2022   TRIG 52 07/24/2022   CHOLHDL 1.9 07/24/2022    Lab Results  Component Value Date   WBC 3.1 (L) 08/07/2022   HGB 11.8 08/07/2022   HCT 35.2 08/07/2022   MCV 97.0 08/07/2022   PLT 164 08/07/2022    BNP (last 3 results) Recent Labs     07/24/22 0000 07/28/22 0121  BNP 169* 205.4*    ProBNP (last 3 results) No results for input(s): "PROBNP" in the last 8760 hours.  External labs   Labs 07/24/2022: BNP 169 (<100).  Hemoglobin 10.600 G/ 05/26/2020 Platelets 155.000 x1 05/26/2020  Creatinine, Serum 1.080 mg/ 05/26/2020 Potassium 4.100 mm 05/26/2020 ALT (SGPT) 17.000 IU/ 05/26/2020  TSH 1.220 05/26/2020 BNP 99.700 pg/ 05/26/2020   Radiology:   Chest x-ray PA and lateral view 05/26/2020: Normal heart size. Aortic tortuosity. No pleural effusion or edema. No airspace consolidation. Three multiple compression deformities are identified within the upper and midthoracic spine. Two of these levels have been treated with bone cement.  Cardiac Studies:   PFO closure 09/03/2016: Interventional data: Successful closure of the atrial septal defect under intracardiac echo guidance with implantation of a 25 millimeter septal occluder. Post-deployment double contrast study revealed no residual shunting.  Lexiscan Thalium stress 03/20/2017:  1. Resting EKG: NSR. Non diagnostic stress EKG due to pharmacologic stress.  2. Normal SPECT study with normal left ventricular systolic function. Low risk study.  PCV ECHOCARDIOGRAM COMPLETE 08/02/2022  Narrative Echocardiogram 08/03/2022: Left ventricle cavity is normal in size. Normal left ventricular wall thickness. Normal global wall motion. Normal LV systolic function with EF 55%. Unable to evaluate diastolic function due to atrial fibrillation. Left atrial cavity is severely dilated. Right atrial cavity is moderately dilated. Mild (Grade I) mitral regurgitation. Mild to moderate tricuspid regurgitation. Estimated pulmonary artery systolic pressure 31 mmHg. Interatrial septal occluder is in good position without obvious residual shunting or thrombus. No significant change from 06/02/2020.  EKG:  EKG 09/03/2022: Atrial fibrillation with controlled ventricular sponsor rate of 75 bpm.   No significant change from 07/22/2022.  Medications and allergies  No Known Allergies  Current Outpatient Medications:    acetaminophen (TYLENOL) 325 MG tablet, Take 650 mg by mouth every 6 (six) hours as needed for moderate pain or headache., Disp: , Rfl:    albuterol (PROVENTIL) (2.5 MG/3ML) 0.083% nebulizer solution, Take 2.5 mg by nebulization every 6 (six) hours as needed for wheezing or shortness  of breath., Disp: , Rfl:    amitriptyline (ELAVIL) 10 MG tablet, Take 10 mg by mouth at bedtime., Disp: , Rfl:    atorvastatin (LIPITOR) 20 MG tablet, TAKE 1 TABLET BY MOUTH EVERY DAY (Patient taking differently: Take 20 mg by mouth daily.), Disp: 90 tablet, Rfl: 2   butalbital-apap-caffeine-codeine (FIORICET WITH CODEINE) 50-325-40-30 MG capsule, Take 1 capsule by mouth every 4 (four) hours as needed for headache., Disp: , Rfl:    empagliflozin (JARDIANCE) 10 MG TABS tablet, Take 1 tablet (10 mg total) by mouth daily., Disp: 30 tablet, Rfl: 0   Ferric Maltol 30 MG CAPS, Take by mouth., Disp: , Rfl:    ferrous sulfate 325 (65 FE) MG tablet, Take 325 mg by mouth daily., Disp: , Rfl:    fluticasone furoate-vilanterol (BREO ELLIPTA) 100-25 MCG/ACT AEPB, Inhale 1 puff into the lungs daily., Disp: 1 each, Rfl: 11   Fluticasone-Umeclidin-Vilant (TRELEGY ELLIPTA) 200-62.5-25 MCG/ACT AEPB, Inhale into the lungs., Disp: , Rfl:    furosemide (LASIX) 40 MG tablet, Take 40 mg by mouth daily as needed for fluid or edema., Disp: , Rfl:    losartan-hydrochlorothiazide (HYZAAR) 50-12.5 MG tablet, Take 1 tablet by mouth every morning., Disp: 30 tablet, Rfl: 1   meclizine (ANTIVERT) 25 MG tablet, Take 25 mg by mouth every 8 (eight) hours as needed for dizziness., Disp: , Rfl:    metoprolol succinate (TOPROL-XL) 50 MG 24 hr tablet, TAKE 1 TABLET BY MOUTH DAILY. TAKE WITH OR IMMEDIATELY FOLLOWING A MEAL. (Patient taking differently: Take 50 mg by mouth daily.), Disp: 90 tablet, Rfl: 3   MULTIPLE MINERALS PO, Take by  mouth., Disp: , Rfl:    potassium chloride (KLOR-CON) 10 MEQ tablet, Take 1 tablet (10 mEq total) by mouth daily., Disp: 90 tablet, Rfl: 3   PROAIR HFA 108 (90 Base) MCG/ACT inhaler, Inhale 2 puffs into the lungs 4 (four) times daily as needed for shortness of breath or wheezing., Disp: , Rfl: 2   RESTASIS 0.05 % ophthalmic emulsion, Place 1 drop into both eyes 2 (two) times daily as needed (dry eyes)., Disp: , Rfl:    Vitamin D, Ergocalciferol, (DRISDOL) 50000 units CAPS capsule, Take 50,000 Units by mouth every Wednesday., Disp: , Rfl:    XARELTO 15 MG TABS tablet, TAKE 1 TABLET (15 MG TOTAL) BY MOUTH DAILY WITH SUPPER (Patient taking differently: Take 15 mg by mouth in the morning.), Disp: 90 tablet, Rfl: 3   Assessment     ICD-10-CM   1. Permanent atrial fibrillation  I48.21 EKG 12-Lead    2. Acute on chronic diastolic heart failure  I50.33 losartan-hydrochlorothiazide (HYZAAR) 50-12.5 MG tablet    Basic Metabolic Panel (BMET)    Brain natriuretic peptide    3. Primary hypertension  I10     4. Stage 3b chronic kidney disease  N18.32      CHA2DS2-VASc Score is 4.  Yearly risk of stroke: 4.8% (A, F, HTN).  Score of 1=0.6; 2=2.2; 3=3.2; 4=4.8; 5=7.2; 6=9.8; 7=>9.8) -(CHF; HTN; vasc disease DM,  Female = 1; Age <65 =0; 65-74 = 1,  >75 =2; stroke/embolism= 2).   Meds ordered this encounter  Medications   losartan-hydrochlorothiazide (HYZAAR) 50-12.5 MG tablet    Sig: Take 1 tablet by mouth every morning.    Dispense:  30 tablet    Refill:  1    Discontinue plain Losartan    Medications Discontinued During This Encounter  Medication Reason   furosemide (LASIX) 20 MG tablet Patient Preference  montelukast (SINGULAIR) 10 MG tablet     Recommendations:   Shandell Giovanni  is a 77 y.o. with h/o mild pancytopenia, negative GI work up in April 2019,  paroxysmal atrial fibrillation with history of cardioversion in 2018, history of PFO closure in 2018, hypertension, hyperlipidemia  presents for annual visit.  Patient was seen by me on 07/22/2022 for acute decompensated heart failure, however she presented 5 days later to the emergency room with SIRS, losartan was discontinued due to mild renal insufficiency and started on furosemide 40 mg daily and eventually discharged home 3 days later.  1. Permanent atrial fibrillation Patient has persistent atrial fibrillation and I suspect it is permanent atrial fibrillation.  Continue anticoagulation with Xarelto 15 mg in the evening. - EKG 12-Lead  2. Acute on chronic diastolic heart failure Patient with acute diastolic heart failure, reviewed her hospitalization records, symptoms of dyspnea has improved and leg edema has resolved. Restart losartan now for pulm change her furosemide from 40 mg daily to as needed use and add losartan HCT 50/12.5 mg in the morning, BMP and BNP in 2 weeks.  When he had last seen her she had 2-3+ right leg edema and 2+ left leg edema and this is essentially resolved.  - losartan-hydrochlorothiazide (HYZAAR) 50-12.5 MG tablet; Take 1 tablet by mouth every morning.  Dispense: 30 tablet; Refill: 1 - Basic Metabolic Panel (BMET) - Brain natriuretic peptide  3. Primary hypertension Blood pressure under good control.  4. Stage 3b chronic kidney disease Reviewed her external labs, stage IIIa-B chronic kidney disease has remained stable.    Yates Decamp, MD, [facility] 09/03/2022, 11:58 AM Office: [phone]

## 2022-09-19 LAB — BASIC METABOLIC PANEL
BUN/Creatinine Ratio: 9 — ABNORMAL LOW (ref 12–28)
BUN: 8 mg/dL (ref 8–27)
CO2: 28 mmol/L (ref 20–29)
Calcium: 9.7 mg/dL (ref 8.7–10.3)
Chloride: 95 mmol/L — ABNORMAL LOW (ref 96–106)
Creatinine, Ser: 0.94 mg/dL (ref 0.57–1.00)
Glucose: 68 mg/dL — ABNORMAL LOW (ref 70–99)
Potassium: 4 mmol/L (ref 3.5–5.2)
Sodium: 137 mmol/L (ref 134–144)
eGFR: 62 mL/min/{1.73_m2} (ref >59)

## 2022-09-19 LAB — BRAIN NATRIURETIC PEPTIDE: BNP: 202.3 pg/mL — ABNORMAL HIGH (ref 0.0–100.0)

## 2022-09-25 ENCOUNTER — Other Ambulatory Visit: Payer: Self-pay | Admitting: Cardiology

## 2022-09-25 DIAGNOSIS — I5033 Acute on chronic diastolic (congestive) heart failure: Secondary | ICD-10-CM

## 2022-10-01 ENCOUNTER — Ambulatory Visit
Admission: RE | Admit: 2022-10-01 | Discharge: 2022-10-01 | Disposition: A | Discharge status: Home / Self Care | Payer: Medicaid Other | Source: Ambulatory Visit | Attending: Internal Medicine | Admitting: Internal Medicine

## 2022-10-01 DIAGNOSIS — Z1231 Encounter for screening mammogram for malignant neoplasm of breast: Secondary | ICD-10-CM

## 2022-10-02 ENCOUNTER — Ambulatory Visit: Payer: Medicaid Other | Admitting: Cardiology

## 2022-10-02 ENCOUNTER — Encounter: Payer: Self-pay | Admitting: Cardiology

## 2022-10-02 VITALS — BP 128/75 | HR 75 | Ht 63.0 in | Wt 166.0 lb

## 2022-10-02 DIAGNOSIS — I5032 Chronic diastolic (congestive) heart failure: Secondary | ICD-10-CM

## 2022-10-02 DIAGNOSIS — N1831 Chronic kidney disease, stage 3a: Secondary | ICD-10-CM

## 2022-10-02 DIAGNOSIS — I10 Essential (primary) hypertension: Secondary | ICD-10-CM

## 2022-10-02 DIAGNOSIS — I4821 Permanent atrial fibrillation: Secondary | ICD-10-CM

## 2022-10-02 MED ORDER — LOSARTAN POTASSIUM-HCTZ 50-12.5 MG PO TABS
ORAL_TABLET | ORAL | 3 refills | Status: DC
Start: 2022-10-02 — End: 2023-12-25

## 2022-10-02 NOTE — Progress Notes (Signed)
Primary Physician/Referring:  Fleet Contras, MD  Patient ID: Morgan Bowers, female    DOB: 07/09/1945, 77 y.o.   MRN: [id number]  Chief Complaint  Patient presents with   Chronic diastolic (congestive) heart failure (HCC)   Permanent atrial fibrillation (HCC)   Primary hypertension   Follow-up   HPI:    Morgan Bowers  is a 77 y.o. with h/o mild pancytopenia, negative GI work up in April 2019,  paroxysmal atrial fibrillation with history of cardioversion in 2018, history of PFO closure in 2018, hypertension, hyperlipidemia presents for annual visit.  Patient was seen by me on 07/22/2022 for acute decompensated heart failure, however she presented 5 days later to the emergency room with SIRS, losartan was discontinued due to mild renal insufficiency and started on furosemide 40 mg daily and eventually discharged home 3 days later. Daughter present.   This is a 6-week follow-up visit after the initial evaluation after the hospitalization, I had started her on losartan HCT and switched her to Lasix as needed.  She now presents for follow-up, no change in symptoms, she has not developed recurrence of leg edema.  Dyspnea on exertion is very minimal and stable.  No PND or orthopnea.  Denies chest pain or palpitations.  Past Medical History:  Diagnosis Date   A-fib (HCC)    Anemia    Arthritis    "knees" (09/03/2016)   Asthma    GERD (gastroesophageal reflux disease)    High cholesterol    Hypertension     Social History   Tobacco Use   Smoking status: Never   Smokeless tobacco: Never  Substance Use Topics   Alcohol use: No   ROS  Review of Systems  Cardiovascular:  Positive for dyspnea on exertion. Negative for chest pain and leg swelling.  Respiratory:  Negative for wheezing.   Gastrointestinal:  Negative for melena.   Objective  Blood pressure 128/75, pulse 75, height 5\' 3"  (1.6 m), weight 166 lb (75.3 kg), SpO2 97 %.     10/02/2022   11:20 AM 09/03/2022    11:12 AM 07/30/2022    9:25 AM  Vitals with BMI  Height 5\' 3"  5\' 3"    Weight 166 lbs 160 lbs 3 oz   BMI 29.41 28.39   Systolic 128 120 409  Diastolic 75 78 75  Pulse 75 62     Physical Exam Neck:     Vascular: No carotid bruit or JVD.  Cardiovascular:     Rate and Rhythm: Normal rate. Rhythm irregular.     Pulses: Intact distal pulses.          Dorsalis pedis pulses are 2+ on the right side and 2+ on the left side.       Posterior tibial pulses are 2+ on the right side and 2+ on the left side.     Heart sounds: Murmur heard.     Midsystolic murmur is present with a grade of 2/6 at the apex.  Pulmonary:     Effort: Pulmonary effort is normal.     Breath sounds: Examination of the right-lower field reveals rhonchi. Examination of the left-lower field reveals rhonchi. Rhonchi present.  Abdominal:     General: Bowel sounds are normal.     Palpations: Abdomen is soft.  Musculoskeletal:     Right lower leg: Edema (Trace) present.     Left lower leg: Edema (Trace) present.    Laboratory examination:   Lab Results  Component Value Date  NA 137 09/17/2022   K 4.0 09/17/2022   CO2 28 09/17/2022   GLUCOSE 68 (L) 09/17/2022   BUN 8 09/17/2022   CREATININE 0.94 09/17/2022   CALCIUM 9.7 09/17/2022   EGFR 62 09/17/2022   GFRNONAA 53 (L) 07/30/2022       Latest Ref Rng & Units 09/17/2022   11:41 AM 08/07/2022   12:23 PM 07/30/2022    2:06 AM  BMP  Glucose 70 - 99 mg/dL 68  78  98   BUN 8 - 27 mg/dL 8  10  18    Creatinine 0.57 - 1.00 mg/dL 1.61  0.96  0.45   BUN/Creat Ratio 12 - 28 9  SEE NOTE:    Sodium 134 - 144 mmol/L 137  137  133   Potassium 3.5 - 5.2 mmol/L 4.0  4.4  4.3   Chloride 96 - 106 mmol/L 95  98  95   CO2 20 - 29 mmol/L 28  34  28   Calcium 8.7 - 10.3 mg/dL 9.7  9.9  9.4     Lab Results  Component Value Date   TSH 0.96 07/24/2022    Lab Results  Component Value Date   CHOL 129 07/24/2022   HDL 69 07/24/2022   LDLCALC 47 07/24/2022   TRIG 52  07/24/2022   CHOLHDL 1.9 07/24/2022    Lab Results  Component Value Date   WBC 3.1 (L) 08/07/2022   HGB 11.8 08/07/2022   HCT 35.2 08/07/2022   MCV 97.0 08/07/2022   PLT 164 08/07/2022    BNP (last 3 results) Recent Labs    07/24/22 0000 07/28/22 0121 09/17/22 1141  BNP 169* 205.4* 202.3*   External labs   Labs 07/24/2022: BNP 169 (<100).  Hemoglobin 10.600 G/ 05/26/2020 Platelets 155.000 x1 05/26/2020  Creatinine, Serum 1.080 mg/ 05/26/2020 Potassium 4.100 mm 05/26/2020 ALT (SGPT) 17.000 IU/ 05/26/2020  TSH 1.220 05/26/2020 BNP 99.700 pg/ 05/26/2020   Radiology:   Chest x-ray PA and lateral view 05/26/2020: Normal heart size. Aortic tortuosity. No pleural effusion or edema. No airspace consolidation. Three multiple compression deformities are identified within the upper and midthoracic spine. Two of these levels have been treated with bone cement.  Cardiac Studies:   PFO closure 09/03/2016: Interventional data: Successful closure of the atrial septal defect under intracardiac echo guidance with implantation of a 25 millimeter septal occluder. Post-deployment double contrast study revealed no residual shunting.  Lexiscan Thalium stress 03/20/2017:  1. Resting EKG: NSR. Non diagnostic stress EKG due to pharmacologic stress.  2. Normal SPECT study with normal left ventricular systolic function. Low risk study.  PCV ECHOCARDIOGRAM COMPLETE 08/02/2022  Narrative Echocardiogram 08/03/2022: Left ventricle cavity is normal in size. Normal left ventricular wall thickness. Normal global wall motion. Normal LV systolic function with EF 55%. Unable to evaluate diastolic function due to atrial fibrillation. Left atrial cavity is severely dilated. Right atrial cavity is moderately dilated. Mild (Grade I) mitral regurgitation. Mild to moderate tricuspid regurgitation. Estimated pulmonary artery systolic pressure 31 mmHg. Interatrial septal occluder is in good position without  obvious residual shunting or thrombus. No significant change from 06/02/2020.  EKG:  EKG 10/02/2022: Atrial fibrillation with controlled ventricular response at the rate of 68 bpm, normal axis, no evidence of ischemia.  No change compared to 09/03/2022, 07/20/2021 and 01/19/2021.  Medications and allergies  No Known Allergies  Current Outpatient Medications:    acetaminophen (TYLENOL) 325 MG tablet, Take 650 mg by mouth every 6 (six) hours as needed  for moderate pain or headache., Disp: , Rfl:    albuterol (PROVENTIL) (2.5 MG/3ML) 0.083% nebulizer solution, Take 2.5 mg by nebulization every 6 (six) hours as needed for wheezing or shortness of breath., Disp: , Rfl:    amitriptyline (ELAVIL) 10 MG tablet, Take 10 mg by mouth at bedtime., Disp: , Rfl:    atorvastatin (LIPITOR) 20 MG tablet, TAKE 1 TABLET BY MOUTH EVERY DAY (Patient taking differently: Take 20 mg by mouth daily.), Disp: 90 tablet, Rfl: 2   butalbital-acetaminophen-caffeine (FIORICET) 50-325-40 MG tablet, Take 1 tablet by mouth every 6 (six) hours as needed., Disp: , Rfl:    doxycycline (VIBRA-TABS) 100 MG tablet, Take 100 mg by mouth 2 (two) times daily., Disp: , Rfl:    empagliflozin (JARDIANCE) 10 MG TABS tablet, Take 1 tablet (10 mg total) by mouth daily., Disp: 30 tablet, Rfl: 0   Ferric Maltol 30 MG CAPS, Take by mouth., Disp: , Rfl:    ferrous sulfate 325 (65 FE) MG tablet, Take 325 mg by mouth daily., Disp: , Rfl:    fluticasone furoate-vilanterol (BREO ELLIPTA) 100-25 MCG/ACT AEPB, Inhale 1 puff into the lungs daily., Disp: 1 each, Rfl: 11   Fluticasone-Umeclidin-Vilant (TRELEGY ELLIPTA) 200-62.5-25 MCG/ACT AEPB, Inhale into the lungs., Disp: , Rfl:    furosemide (LASIX) 40 MG tablet, Take 40 mg by mouth daily as needed for fluid or edema., Disp: , Rfl:    meclizine (ANTIVERT) 25 MG tablet, Take 25 mg by mouth every 8 (eight) hours as needed for dizziness., Disp: , Rfl:    metoprolol succinate (TOPROL-XL) 50 MG 24 hr tablet,  TAKE 1 TABLET BY MOUTH DAILY. TAKE WITH OR IMMEDIATELY FOLLOWING A MEAL. (Patient taking differently: Take 50 mg by mouth daily.), Disp: 90 tablet, Rfl: 3   MULTIPLE MINERALS PO, Take by mouth., Disp: , Rfl:    potassium chloride (KLOR-CON) 10 MEQ tablet, Take 1 tablet (10 mEq total) by mouth daily. (Patient taking differently: Take 10 mEq by mouth daily as needed (With Furosemide only).), Disp: 90 tablet, Rfl: 3   PROAIR HFA 108 (90 Base) MCG/ACT inhaler, Inhale 2 puffs into the lungs 4 (four) times daily as needed for shortness of breath or wheezing., Disp: , Rfl: 2   Vitamin D, Ergocalciferol, (DRISDOL) 50000 units CAPS capsule, Take 50,000 Units by mouth every Wednesday., Disp: , Rfl:    XARELTO 15 MG TABS tablet, TAKE 1 TABLET (15 MG TOTAL) BY MOUTH DAILY WITH SUPPER (Patient taking differently: Take 15 mg by mouth daily with supper.), Disp: 90 tablet, Rfl: 3   losartan-hydrochlorothiazide (HYZAAR) 50-12.5 MG tablet, TAKE 1 TABLET BY MOUTH EVERY DAY IN THE MORNING, Disp: 90 tablet, Rfl: 3   Assessment     ICD-10-CM   1. Chronic diastolic (congestive) heart failure (HCC)  I50.32 losartan-hydrochlorothiazide (HYZAAR) 50-12.5 MG tablet    2. Permanent atrial fibrillation (HCC)  I48.21 EKG 12-Lead    3. Primary hypertension  I10 losartan-hydrochlorothiazide (HYZAAR) 50-12.5 MG tablet    4. Stage 3a chronic kidney disease (HCC)  N18.31      CHA2DS2-VASc Score is 4.  Yearly risk of stroke: 4.8% (A, F, HTN).  Score of 1=0.6; 2=2.2; 3=3.2; 4=4.8; 5=7.2; 6=9.8; 7=>9.8) -(CHF; HTN; vasc disease DM,  Female = 1; Age <65 =0; 65-74 = 1,  >75 =2; stroke/embolism= 2).   Meds ordered this encounter  Medications   losartan-hydrochlorothiazide (HYZAAR) 50-12.5 MG tablet    Sig: TAKE 1 TABLET BY MOUTH EVERY DAY IN THE MORNING  Dispense:  90 tablet    Refill:  3    Medications Discontinued During This Encounter  Medication Reason   butalbital-apap-caffeine-codeine (FIORICET WITH CODEINE)  50-325-40-30 MG capsule    RESTASIS 0.05 % ophthalmic emulsion    losartan-hydrochlorothiazide (HYZAAR) 50-12.5 MG tablet Reorder     Recommendations:   Morgan Bowers  is a 77 y.o. with h/o mild pancytopenia, negative GI work up in April 2019,  paroxysmal atrial fibrillation with history of cardioversion in 2018, history of PFO closure in 2018, hypertension, hyperlipidemia presents for annual visit.  Patient was seen by me on 07/22/2022 for acute decompensated heart failure, however she presented 5 days later to the emergency room with SIRS, losartan was discontinued due to mild renal insufficiency and started on furosemide 40 mg daily and eventually discharged home 3 days later. She presents for f/u of AF and CHF.  1. Chronic diastolic (congestive) heart failure (HCC) Patient is presently doing well, she is tolerating losartan HCT and no change in her renal function.  Advised her to continue same, she has not used furosemide since last office visit 6 weeks ago.  - losartan-hydrochlorothiazide (HYZAAR) 50-12.5 MG tablet; TAKE 1 TABLET BY MOUTH EVERY DAY IN THE MORNING  Dispense: 90 tablet; Refill: 3  2. Permanent atrial fibrillation (HCC) I reviewed her EKGs from previous months and also last couple years, she is now in permanent atrial fibrillation.  Do not think atrial fibrillation precipitated heart failure.  Will continue rate control strategy only. - EKG 12-Lead  3. Primary hypertension Blood pressure is well-controlled on present medical regimen, continue the same. - losartan-hydrochlorothiazide (HYZAAR) 50-12.5 MG tablet; TAKE 1 TABLET BY MOUTH EVERY DAY IN THE MORNING  Dispense: 90 tablet; Refill: 3  4. Stage 3a chronic kidney disease (HCC) Level, renal function has remained stable.  Continue losartan HCT.  She will take furosemide only on a as needed basis along with potassium.  I will see her back in 6 months for follow-up.   Morgan Decamp, MD, [facility] 10/02/2022, 12:22  PM Office: [phone]

## 2022-10-03 ENCOUNTER — Ambulatory Visit: Payer: Medicaid Other | Admitting: Neurology

## 2022-10-08 ENCOUNTER — Other Ambulatory Visit: Payer: Self-pay | Admitting: Cardiology

## 2022-10-08 DIAGNOSIS — I4891 Unspecified atrial fibrillation: Secondary | ICD-10-CM

## 2022-11-04 ENCOUNTER — Ambulatory Visit: Payer: Medicaid Other | Admitting: Neurology

## 2022-11-04 ENCOUNTER — Encounter: Payer: Self-pay | Admitting: Neurology

## 2022-11-04 VITALS — BP 112/58 | HR 96 | Resp 16 | Wt 163.0 lb

## 2022-11-04 DIAGNOSIS — R519 Headache, unspecified: Secondary | ICD-10-CM

## 2022-11-04 NOTE — Progress Notes (Signed)
Chief Complaint  Patient presents with   New Patient (Initial Visit)    RM12, grandDAUGHTER PRESENT  HEADACHES:HAD ONGOING SINCE DECEMBER BUT WORSE AT TIMES      ASSESSMENT AND PLAN  Morgan Bowers is a 77 y.o. female   New onset headache in elderly, Decreased appetite  ESR C-reactive protein to rule out temporal arteritis  MRI of the brain with without contrast to rule out structural abnormality,  Fioricet as needed  Return To Clinic With NP In 3 Months   DIAGNOSTIC DATA (LABS, IMAGING, TESTING) - I reviewed patient records, labs, notes, testing and imaging myself where available.   MEDICAL HISTORY:  Morgan Bowers is a 77 year old female, accompanied by her granddaughter, seen in request by Dr. Fleet Contras, for evaluation of new onset headache, her daughter is connected via phone call on November 04, 2022  I reviewed and summarized the referring note. PMHX. HLD HTN GERD Afib DM  She is a native of [place], lives with her daughter and granddaughter, since December 2023, she began to have frequent headaches, holoacranial pressure headaches, can lasting for hours or days, half of the week she is dealing with some kind of a headache  Personally reviewed CT head without contrast July 27, 2022, mild small vessel disease no acute abnormality  She denies new onset focal symptoms, complains of lack of appetite, gait abnormality due to bilateral knee pain, poor sleep quality, do have daytime sleepiness, nighttime snoring,  She was given Fioricet  by her primary care physician as needed does help her headache some,   PHYSICAL EXAM:   Vitals:   11/04/22 1126  BP: (!) 112/58  Pulse: 96  Resp: 16  Weight: 163 lb (73.9 kg)     Body mass index is 28.87 kg/m.  PHYSICAL EXAMNIATION:  Gen: NAD, conversant, well nourised, well groomed                     Cardiovascular: Regular rate rhythm, no peripheral edema, warm, nontender. Eyes: Conjunctivae clear  without exudates or hemorrhage Neck: Supple, no carotid bruits. Pulmonary: Clear to auscultation bilaterally   NEUROLOGICAL EXAM:  MENTAL STATUS: Speech/cognition: Awake, alert, oriented to history taking and casual conversation CRANIAL NERVES: CN II: Visual fields are full to confrontation. Pupils are round equal and briskly reactive to light. CN III, IV, VI: extraocular movement are normal. No ptosis. CN V: Facial sensation is intact to light touch CN VII: Face is symmetric with normal eye closure  CN VIII: Hearing is normal to causal conversation. CN IX, X: Phonation is normal. CN XI: Head turning and shoulder shrug are intact  MOTOR: There is no pronator drift of out-stretched arms. Muscle bulk and tone are normal. Muscle strength is normal.  REFLEXES: Reflexes are 1 and symmetric at the biceps, triceps, knees, and ankles. Plantar responses are flexor.  SENSORY: Intact to light touch, pinprick and vibratory sensation are intact in fingers and toes.  COORDINATION: There is no trunk or limb dysmetria noted.  GAIT/STANCE: Push-up to get up from seated position, cautious  REVIEW OF SYSTEMS:  Full 14 system review of systems performed and notable only for as above All other review of systems were negative.   ALLERGIES: No Known Allergies  HOME MEDICATIONS: Current Outpatient Medications  Medication Sig Dispense Refill   acetaminophen (TYLENOL) 325 MG tablet Take 650 mg by mouth every 6 (six) hours as needed for moderate pain or headache.     albuterol (PROVENTIL) (2.5 MG/3ML)  0.083% nebulizer solution Take 2.5 mg by nebulization every 6 (six) hours as needed for wheezing or shortness of breath.     amitriptyline (ELAVIL) 10 MG tablet Take 10 mg by mouth at bedtime.     atorvastatin (LIPITOR) 20 MG tablet TAKE 1 TABLET BY MOUTH EVERY DAY (Patient taking differently: Take 20 mg by mouth daily.) 90 tablet 2   butalbital-acetaminophen-caffeine (FIORICET) 50-325-40 MG tablet  Take 1 tablet by mouth every 6 (six) hours as needed.     doxycycline (VIBRA-TABS) 100 MG tablet Take 100 mg by mouth 2 (two) times daily.     empagliflozin (JARDIANCE) 10 MG TABS tablet Take 1 tablet (10 mg total) by mouth daily. 30 tablet 0   Ferric Maltol 30 MG CAPS Take by mouth.     ferrous sulfate 325 (65 FE) MG tablet Take 325 mg by mouth daily.     fluticasone furoate-vilanterol (BREO ELLIPTA) 100-25 MCG/ACT AEPB Inhale 1 puff into the lungs daily. 1 each 11   Fluticasone-Umeclidin-Vilant (TRELEGY ELLIPTA) 200-62.5-25 MCG/ACT AEPB Inhale into the lungs.     furosemide (LASIX) 40 MG tablet Take 40 mg by mouth daily as needed for fluid or edema.     losartan-hydrochlorothiazide (HYZAAR) 50-12.5 MG tablet TAKE 1 TABLET BY MOUTH EVERY DAY IN THE MORNING 90 tablet 3   meclizine (ANTIVERT) 25 MG tablet Take 25 mg by mouth every 8 (eight) hours as needed for dizziness.     metoprolol succinate (TOPROL-XL) 50 MG 24 hr tablet TAKE 1 TABLET BY MOUTH EVERY DAY WITH OR IMMEDIATELY FOLLOWING A MEAL 90 tablet 3   MULTIPLE MINERALS PO Take by mouth.     potassium chloride (KLOR-CON) 10 MEQ tablet Take 1 tablet (10 mEq total) by mouth daily. (Patient taking differently: Take 10 mEq by mouth daily as needed (With Furosemide only).) 90 tablet 3   PROAIR HFA 108 (90 Base) MCG/ACT inhaler Inhale 2 puffs into the lungs 4 (four) times daily as needed for shortness of breath or wheezing.  2   Vitamin D, Ergocalciferol, (DRISDOL) 50000 units CAPS capsule Take 50,000 Units by mouth every Wednesday.     XARELTO 15 MG TABS tablet TAKE 1 TABLET (15 MG TOTAL) BY MOUTH DAILY WITH SUPPER (Patient taking differently: Take 15 mg by mouth daily with supper.) 90 tablet 3   No current facility-administered medications for this visit.    PAST MEDICAL HISTORY: Past Medical History:  Diagnosis Date   A-fib (HCC)    Anemia    Arthritis    "knees" (09/03/2016)   Asthma    GERD (gastroesophageal reflux disease)     Headache    High cholesterol    Hypertension     PAST SURGICAL HISTORY: Past Surgical History:  Procedure Laterality Date   CARDIOVERSION N/A 08/06/2016   Procedure: CARDIOVERSION;  Surgeon: Yates Decamp, MD;  Location: [facility] ENDOSCOPY;  Service: Cardiovascular;  Laterality: N/A;   CARDIOVERSION N/A 08/03/2019   Procedure: CARDIOVERSION;  Surgeon: Yates Decamp, MD;  Location: [facility] ENDOSCOPY;  Service: Cardiovascular;  Laterality: N/A;   COLONOSCOPY WITH PROPOFOL N/A 08/13/2017   Procedure: COLONOSCOPY WITH PROPOFOL;  Surgeon: Carman Ching, MD;  Location: [facility] ENDOSCOPY;  Service: Endoscopy;  Laterality: N/A;   ESOPHAGOGASTRODUODENOSCOPY (EGD) WITH PROPOFOL N/A 08/13/2017   Procedure: ESOPHAGOGASTRODUODENOSCOPY (EGD) WITH PROPOFOL;  Surgeon: Carman Ching, MD;  Location: [facility] ENDOSCOPY;  Service: Endoscopy;  Laterality: N/A;   IR GENERIC HISTORICAL  06/19/2016   IR VERTEBROPLASTY CERV/THOR BX INC UNI/BIL INC/INJECT/IMAGING 06/19/2016 Julieanne Cotton, MD  [facility]INTER[facility] RAD   IR GENERIC HISTORICAL  06/19/2016   IR VERTEBROPLASTY EA ADDL (T&LS) BX INC UNI/BIL INC INJECT/IMAGING 06/19/2016 Julieanne Cotton, MD [facility]   PATENT FORAMEN OVALE CLOSURE  09/03/2016   PATENT FORAMEN OVALE(PFO) CLOSURE N/A 09/03/2016   Procedure: Patent [place](PFO) Closure;  Surgeon: Yates Decamp, MD;  Location: [facility] INVASIVE CV LAB;  Service: Cardiovascular;  Laterality: N/A;   TEE WITHOUT CARDIOVERSION N/A 02/29/2016   Procedure: TRANSESOPHAGEAL ECHOCARDIOGRAM (TEE);  Surgeon: Yates Decamp, MD;  Location: [facility] ENDOSCOPY;  Service: Cardiovascular;  Laterality: N/A;   TOE SURGERY Right 2020    FAMILY HISTORY: Family History  Problem Relation Age of Onset   Lung disease Neg Hx     SOCIAL HISTORY: Social History   Socioeconomic History   Marital status: Widowed    Spouse name: Not on file   Number of children: 9   Years of education: Not on file   Highest education level: Not on file  Occupational History   Occupation: retired   Tobacco Use   Smoking status: Never   Smokeless tobacco: Never  Vaping Use   Vaping Use: Never used  Substance and Sexual Activity   Alcohol use: No   Drug use: No   Sexual activity: Not Currently  Other Topics Concern   Not on file  Social History Narrative   From [place].   Social Determinants of Health   Financial Resource Strain: Not on file  Food Insecurity: No Food Insecurity (07/28/2022)   Hunger Vital Sign    Worried About Running Out of Food in the Last Year: Never true    Ran Out of Food in the Last Year: Never true  Transportation Needs: No Transportation Needs (07/28/2022)   PRAPARE - Administrator, Civil Service (Medical): No    Lack of Transportation (Non-Medical): No  Physical Activity: Not on file  Stress: Not on file  Social Connections: Not on file  Intimate Partner Violence: Not At Risk (07/28/2022)   Humiliation, Afraid, Rape, and Kick questionnaire    Fear of Current or Ex-Partner: No    Emotionally Abused: No    Physically Abused: No    Sexually Abused: No      Levert Feinstein, M.D. Ph.D.  [facility] [address], [address] [place], [place] [address] Ph: [phone] Fax: [phone]  CC:  Zannie Cove, MD [address] [place],  [place] [address]  Fleet Contras, MD

## 2022-11-05 LAB — SEDIMENTATION RATE: Sed Rate: 5 mm/hr (ref 0–40)

## 2022-11-05 LAB — C-REACTIVE PROTEIN: CRP: 1 mg/L (ref 0–10)

## 2022-11-13 ENCOUNTER — Telehealth: Payer: Self-pay | Admitting: Neurology

## 2022-11-13 NOTE — Telephone Encounter (Signed)
[employer] Berkley Harvey: [id number] exp. 11/06/2022-01/05/2023 sent to GI [phone]

## 2022-11-29 ENCOUNTER — Ambulatory Visit (INDEPENDENT_AMBULATORY_CARE_PROVIDER_SITE_OTHER): Payer: Medicaid Other | Admitting: Podiatry

## 2022-11-29 ENCOUNTER — Ambulatory Visit
Admission: RE | Admit: 2022-11-29 | Discharge: 2022-11-29 | Disposition: A | Discharge status: Home / Self Care | Payer: Medicaid Other | Source: Ambulatory Visit | Attending: Neurology | Admitting: Neurology

## 2022-11-29 DIAGNOSIS — R519 Headache, unspecified: Secondary | ICD-10-CM

## 2022-11-29 DIAGNOSIS — M7751 Other enthesopathy of right foot: Secondary | ICD-10-CM

## 2022-11-29 MED ORDER — GADOPICLENOL 0.5 MMOL/ML IV SOLN
8.0000 mL | Freq: Once | INTRAVENOUS | Status: AC | PRN
  Administered 2022-11-29: 8 mL via INTRAVENOUS

## 2022-11-29 NOTE — Progress Notes (Signed)
Subjective:  Patient ID: Morgan Bowers, female    DOB: 1945-11-12,  MRN: [id number]  Chief Complaint  Patient presents with   Toe Pain    Right foot 5th toe pain     77 y.o. female presents with the above complaint.  Patient presents with right fifth digit PIPJ joint pain.  Patient states pain for touch is progressive and worsens with ambulation is with pressure she has not seen MRIs prior to seeing me.  She would like to discuss treatment options for her.  In the past she has gone injection which has helped.  She would like to do injection.   Review of Systems: Negative except as noted in the HPI. Denies N/V/F/Ch.  Past Medical History:  Diagnosis Date   A-fib (HCC)    Anemia    Arthritis    "knees" (09/03/2016)   Asthma    GERD (gastroesophageal reflux disease)    Headache    High cholesterol    Hypertension     Current Outpatient Medications:    acetaminophen (TYLENOL) 325 MG tablet, Take 650 mg by mouth every 6 (six) hours as needed for moderate pain or headache., Disp: , Rfl:    albuterol (PROVENTIL) (2.5 MG/3ML) 0.083% nebulizer solution, Take 2.5 mg by nebulization every 6 (six) hours as needed for wheezing or shortness of breath., Disp: , Rfl:    amitriptyline (ELAVIL) 10 MG tablet, Take 10 mg by mouth at bedtime., Disp: , Rfl:    atorvastatin (LIPITOR) 20 MG tablet, TAKE 1 TABLET BY MOUTH EVERY DAY (Patient taking differently: Take 20 mg by mouth daily.), Disp: 90 tablet, Rfl: 2   butalbital-acetaminophen-caffeine (FIORICET) 50-325-40 MG tablet, Take 1 tablet by mouth every 6 (six) hours as needed., Disp: , Rfl:    doxycycline (VIBRA-TABS) 100 MG tablet, Take 100 mg by mouth 2 (two) times daily., Disp: , Rfl:    empagliflozin (JARDIANCE) 10 MG TABS tablet, Take 1 tablet (10 mg total) by mouth daily., Disp: 30 tablet, Rfl: 0   Ferric Maltol 30 MG CAPS, Take by mouth., Disp: , Rfl:    ferrous sulfate 325 (65 FE) MG tablet, Take 325 mg by mouth daily., Disp: , Rfl:     fluticasone furoate-vilanterol (BREO ELLIPTA) 100-25 MCG/ACT AEPB, Inhale 1 puff into the lungs daily., Disp: 1 each, Rfl: 11   Fluticasone-Umeclidin-Vilant (TRELEGY ELLIPTA) 200-62.5-25 MCG/ACT AEPB, Inhale into the lungs., Disp: , Rfl:    furosemide (LASIX) 40 MG tablet, Take 40 mg by mouth daily as needed for fluid or edema., Disp: , Rfl:    losartan-hydrochlorothiazide (HYZAAR) 50-12.5 MG tablet, TAKE 1 TABLET BY MOUTH EVERY DAY IN THE MORNING, Disp: 90 tablet, Rfl: 3   meclizine (ANTIVERT) 25 MG tablet, Take 25 mg by mouth every 8 (eight) hours as needed for dizziness., Disp: , Rfl:    metoprolol succinate (TOPROL-XL) 50 MG 24 hr tablet, TAKE 1 TABLET BY MOUTH EVERY DAY WITH OR IMMEDIATELY FOLLOWING A MEAL, Disp: 90 tablet, Rfl: 3   MULTIPLE MINERALS PO, Take by mouth., Disp: , Rfl:    potassium chloride (KLOR-CON) 10 MEQ tablet, Take 1 tablet (10 mEq total) by mouth daily. (Patient taking differently: Take 10 mEq by mouth daily as needed (With Furosemide only).), Disp: 90 tablet, Rfl: 3   PROAIR HFA 108 (90 Base) MCG/ACT inhaler, Inhale 2 puffs into the lungs 4 (four) times daily as needed for shortness of breath or wheezing., Disp: , Rfl: 2   Vitamin D, Ergocalciferol, (DRISDOL) 50000 units  CAPS capsule, Take 50,000 Units by mouth every Wednesday., Disp: , Rfl:    XARELTO 15 MG TABS tablet, TAKE 1 TABLET (15 MG TOTAL) BY MOUTH DAILY WITH SUPPER (Patient taking differently: Take 15 mg by mouth daily with supper.), Disp: 90 tablet, Rfl: 3  Social History   Tobacco Use  Smoking Status Never  Smokeless Tobacco Never    No Known Allergies Objective:  There were no vitals filed for this visit. There is no height or weight on file to calculate BMI. Constitutional Well developed. Well nourished.  Vascular Dorsalis pedis pulses palpable bilaterally. Posterior tibial pulses palpable bilaterally. Capillary refill normal to all digits.  No cyanosis or clubbing noted. Pedal hair growth  normal.  Neurologic Normal speech. Oriented to person, place, and time. Epicritic sensation to light touch grossly present bilaterally.  Dermatologic Nails well groomed and normal in appearance. No open wounds. No skin lesions.  Orthopedic: Pain on palpation of right fifth digit PIPJ joint.  Pain with range of motion of the joint.  Some swelling noted at the fifth toe   Radiographs: None Assessment:   1. Capsulitis of toe, right    Plan:  Patient was evaluated and treated and all questions answered.  Right fifth digit capsulitis -All questions or concerns were discussed with the patient in extensive detail -Given the amount of pain that she is having she will benefit from steroid injection help decrease inflammatory component associate with pain.  Patient agrees with plan like to proceed with steroid injection -A steroid injection was performed at right fifth digit PIPJ joint using 1% plain Lidocaine and 10 mg of Kenalog. This was well tolerated.   No follow-ups on file.

## 2022-12-09 ENCOUNTER — Other Ambulatory Visit: Payer: Self-pay | Admitting: Neurology

## 2022-12-09 DIAGNOSIS — I671 Cerebral aneurysm, nonruptured: Secondary | ICD-10-CM

## 2022-12-09 DIAGNOSIS — R519 Headache, unspecified: Secondary | ICD-10-CM

## 2022-12-11 ENCOUNTER — Telehealth: Payer: Self-pay | Admitting: Neurology

## 2022-12-11 NOTE — Telephone Encounter (Signed)
[employer] Berkley Harvey: [id number] exp. 12/09/2022-02/07/2023 for GI

## 2022-12-27 ENCOUNTER — Encounter: Payer: Self-pay | Admitting: Neurology

## 2022-12-30 ENCOUNTER — Ambulatory Visit
Admission: RE | Admit: 2022-12-30 | Discharge: 2022-12-30 | Disposition: A | Discharge status: Home / Self Care | Payer: Medicaid Other | Source: Ambulatory Visit | Attending: Neurology | Admitting: Neurology

## 2022-12-30 DIAGNOSIS — R519 Headache, unspecified: Secondary | ICD-10-CM

## 2022-12-30 DIAGNOSIS — I671 Cerebral aneurysm, nonruptured: Secondary | ICD-10-CM

## 2022-12-30 MED ORDER — IOPAMIDOL (ISOVUE-370) INJECTION 76%
200.0000 mL | Freq: Once | INTRAVENOUS | Status: AC | PRN
  Administered 2022-12-30: 75 mL via INTRAVENOUS

## 2023-01-15 ENCOUNTER — Telehealth: Payer: Self-pay | Admitting: *Deleted

## 2023-01-15 NOTE — Telephone Encounter (Signed)
-----   Message from Asa Lente sent at 01/14/2023  9:48 PM EDT ----- Please let her know that the CT angiogram looked good.  The left internal carotid artery is bigger than the right.  This appears to be a normal variant and there was no evidence of aneurysm

## 2023-01-15 NOTE — Telephone Encounter (Signed)
I called pt and relayed to her that per Dr. Epimenio Foot the  CTA results looked good, no aneurysms seen, did have a normal variant of her L internal carotid artery is bigger then her R internal artery.  She verbalized understanding. Appreciated call.

## 2023-02-01 ENCOUNTER — Other Ambulatory Visit: Payer: Self-pay | Admitting: Cardiology

## 2023-02-01 DIAGNOSIS — I480 Paroxysmal atrial fibrillation: Secondary | ICD-10-CM

## 2023-02-06 ENCOUNTER — Ambulatory Visit: Payer: Medicaid Other | Admitting: Adult Health

## 2023-03-20 ENCOUNTER — Encounter: Payer: Self-pay | Admitting: Adult Health

## 2023-03-20 ENCOUNTER — Ambulatory Visit: Payer: Medicaid Other | Admitting: Adult Health

## 2023-03-20 VITALS — BP 116/63 | HR 80 | Ht 65.0 in | Wt 167.0 lb

## 2023-03-20 DIAGNOSIS — G44221 Chronic tension-type headache, intractable: Secondary | ICD-10-CM | POA: Diagnosis not present

## 2023-03-20 DIAGNOSIS — R519 Headache, unspecified: Secondary | ICD-10-CM

## 2023-03-20 NOTE — Progress Notes (Signed)
Chief Complaint  Patient presents with   Follow-up    RM 3, here with daughter Domingo Cocking  Pt is here for headache follow up. Pt states she still having 2-3 headaches a week. States she takes tylenol for headaches with no relief.       ASSESSMENT AND PLAN  Morgan Bowers is a 77 y.o. female   New onset headache in elderly, Consistent with tension type headache  Continued frequent headaches -currently prescribed amitriptyline 10 mg nightly by PCP for sleep but takes intermittently - recommend taking nightly over the next 2-3 weeks and if headaches persist, can discuss dosage increase with PCP (has f/u just prior to thanksgiving)  Okay to use Tylenol and Fioricet as needed but advised limiting use to no more than 5 times per month as this can lead to rebound headaches ESR and CRP normal  MRI brain neck small vessel disease  CTA head L ICA larger than R ICA but normal variant      No further recommendations or workup indicated from our standpoint.  Can return back to PCP for ongoing management.     DIAGNOSTIC DATA (LABS, IMAGING, TESTING) - I reviewed patient records, labs, notes, testing and imaging myself where available.  11/29/2022 MRI brain w/wo contrast Scattered T2/FLAIR hyperintense foci in the cerebral hemispheres consistent with mild chronic microvascular ischemic change. Mild enlargement of the distal left internal carotid artery.  MR or CT angiogram is recommended to further characterize to determine if aneurysmal versus dolichoectasia. No acute findings.       12/30/2022 CTA head IMPRESSION: 1. Positive for pronounced generalized arterial tortuosity in the head and at the skull base. But asymmetric enlargement of the Left ICA is a normal anatomic variation. Negative for intracranial aneurysm, and no significant intracranial atherosclerosis or stenosis. 2. Stable CT appearance of the brain with small chronic left cerebellar infarcts.     MEDICAL  HISTORY:  Update 03/20/2023 JM: Patient returns for follow-up visit accompanied by her daughter.  Completed further workup for new onset headache which was largely unremarkable.  She reports continued headaches about 2-3 times per week but can fluctuate and be more frequent or can last for days at a time, currently using Tylenol as needed but denies any significant benefit.  Occasional use of Fioricet with some benefit.  Denies excessive use of either Tylenol or Fioricet.  Prescribed amitriptyline 10 mg nightly for sleep by PCP but takes intermittently.  Headaches primarily affecting the top of her head but can also be generalized with pressure type sensation.     History provided for reference purposes only  Consult visit 11/04/2022 Dr. Terrace Arabia: Morgan Bowers is a 77 year old female, accompanied by her granddaughter, seen in request by Dr. Fleet Contras, for evaluation of new onset headache, her daughter is connected via phone call on November 04, 2022  I reviewed and summarized the referring note. PMHX. HLD HTN GERD Afib DM  She is a native of [place], lives with her daughter and granddaughter, since December 2023, she began to have frequent headaches, holoacranial pressure headaches, can lasting for hours or days, half of the week she is dealing with some kind of a headache  Personally reviewed CT head without contrast July 27, 2022, mild small vessel disease no acute abnormality  She denies new onset focal symptoms, complains of lack of appetite, gait abnormality due to bilateral knee pain, poor sleep quality, do have daytime sleepiness, nighttime snoring,  She was given Fioricet  by her  primary care physician as needed does help her headache some,     PHYSICAL EXAM:   Vitals:   03/20/23 1248  BP: 116/63  Pulse: 80  Weight: 167 lb (75.8 kg)  Height: 5\' 5"  (1.651 m)   Body mass index is 27.79 kg/m.  PHYSICAL EXAMNIATION:  Gen: NAD, conversant, well nourised, well groomed                      Cardiovascular: Regular rate rhythm, no peripheral edema, warm, nontender. Eyes: Conjunctivae clear without exudates or hemorrhage Neck: Supple, no carotid bruits. Pulmonary: Clear to auscultation bilaterally   NEUROLOGICAL EXAM:  MENTAL STATUS: Speech/cognition: Awake, alert, oriented to history taking and casual conversation CRANIAL NERVES: CN II: Visual fields are full to confrontation. Pupils are round equal and briskly reactive to light. CN III, IV, VI: extraocular movement are normal. No ptosis. CN V: Facial sensation is intact to light touch CN VII: Face is symmetric with normal eye closure  CN VIII: Hearing is normal to causal conversation. CN IX, X: Phonation is normal. CN XI: Head turning and shoulder shrug are intact  MOTOR: There is no pronator drift of out-stretched arms. Muscle bulk and tone are normal. Muscle strength is normal although limited due to bilateral knee pain  REFLEXES: Reflexes are 1 and symmetric at the biceps, triceps, knees, and ankles. Plantar responses are flexor.  SENSORY: Intact to light touch, pinprick and vibratory sensation are intact in fingers and toes.  COORDINATION: There is no trunk or limb dysmetria noted.    REVIEW OF SYSTEMS:  Full 14 system review of systems performed and notable only for as above All other review of systems were negative.   ALLERGIES: No Known Allergies  HOME MEDICATIONS: Current Outpatient Medications  Medication Sig Dispense Refill   acetaminophen (TYLENOL) 325 MG tablet Take 650 mg by mouth every 6 (six) hours as needed for moderate pain or headache.     albuterol (PROVENTIL) (2.5 MG/3ML) 0.083% nebulizer solution Take 2.5 mg by nebulization every 6 (six) hours as needed for wheezing or shortness of breath.     amitriptyline (ELAVIL) 10 MG tablet Take 10 mg by mouth at bedtime.     atorvastatin (LIPITOR) 20 MG tablet TAKE 1 TABLET BY MOUTH EVERY DAY 90 tablet 2    butalbital-acetaminophen-caffeine (FIORICET) 50-325-40 MG tablet Take 1 tablet by mouth every 6 (six) hours as needed.     empagliflozin (JARDIANCE) 10 MG TABS tablet Take 1 tablet (10 mg total) by mouth daily. 30 tablet 0   Ferric Maltol 30 MG CAPS Take by mouth.     ferrous sulfate 325 (65 FE) MG tablet Take 325 mg by mouth daily.     fluticasone furoate-vilanterol (BREO ELLIPTA) 100-25 MCG/ACT AEPB Inhale 1 puff into the lungs daily. 1 each 11   Fluticasone-Umeclidin-Vilant (TRELEGY ELLIPTA) 200-62.5-25 MCG/ACT AEPB Inhale into the lungs.     furosemide (LASIX) 40 MG tablet Take 40 mg by mouth daily as needed for fluid or edema.     losartan-hydrochlorothiazide (HYZAAR) 50-12.5 MG tablet TAKE 1 TABLET BY MOUTH EVERY DAY IN THE MORNING 90 tablet 3   meclizine (ANTIVERT) 25 MG tablet Take 25 mg by mouth every 8 (eight) hours as needed for dizziness.     metoprolol succinate (TOPROL-XL) 50 MG 24 hr tablet TAKE 1 TABLET BY MOUTH EVERY DAY WITH OR IMMEDIATELY FOLLOWING A MEAL 90 tablet 3   MULTIPLE MINERALS PO Take by mouth.  potassium chloride (KLOR-CON) 10 MEQ tablet Take 1 tablet (10 mEq total) by mouth daily. (Patient taking differently: Take 10 mEq by mouth daily as needed (With Furosemide only).) 90 tablet 3   PROAIR HFA 108 (90 Base) MCG/ACT inhaler Inhale 2 puffs into the lungs 4 (four) times daily as needed for shortness of breath or wheezing.  2   Vitamin D, Ergocalciferol, (DRISDOL) 50000 units CAPS capsule Take 50,000 Units by mouth every Wednesday.     XARELTO 15 MG TABS tablet TAKE 1 TABLET (15 MG TOTAL) BY MOUTH DAILY WITH SUPPER 90 tablet 3   No current facility-administered medications for this visit.    PAST MEDICAL HISTORY: Past Medical History:  Diagnosis Date   A-fib (HCC)    Anemia    Arthritis    "knees" (09/03/2016)   Asthma    GERD (gastroesophageal reflux disease)    Headache    High cholesterol    Hypertension     PAST SURGICAL HISTORY: Past Surgical  History:  Procedure Laterality Date   CARDIOVERSION N/A 08/06/2016   Procedure: CARDIOVERSION;  Surgeon: Yates Decamp, MD;  Location: [facility] ENDOSCOPY;  Service: Cardiovascular;  Laterality: N/A;   CARDIOVERSION N/A 08/03/2019   Procedure: CARDIOVERSION;  Surgeon: Yates Decamp, MD;  Location: [facility] ENDOSCOPY;  Service: Cardiovascular;  Laterality: N/A;   COLONOSCOPY WITH PROPOFOL N/A 08/13/2017   Procedure: COLONOSCOPY WITH PROPOFOL;  Surgeon: Carman Ching, MD;  Location: [facility] ENDOSCOPY;  Service: Endoscopy;  Laterality: N/A;   ESOPHAGOGASTRODUODENOSCOPY (EGD) WITH PROPOFOL N/A 08/13/2017   Procedure: ESOPHAGOGASTRODUODENOSCOPY (EGD) WITH PROPOFOL;  Surgeon: Carman Ching, MD;  Location: [facility] ENDOSCOPY;  Service: Endoscopy;  Laterality: N/A;   IR GENERIC HISTORICAL  06/19/2016   IR VERTEBROPLASTY CERV/THOR BX INC UNI/BIL INC/INJECT/IMAGING 06/19/2016 Julieanne Cotton, MD [facility]-INTERV RAD   IR GENERIC HISTORICAL  06/19/2016   IR VERTEBROPLASTY EA ADDL (T&LS) BX INC UNI/BIL INC INJECT/IMAGING 06/19/2016 Julieanne Cotton, MD [facility]   PATENT FORAMEN OVALE CLOSURE  09/03/2016   PATENT FORAMEN OVALE(PFO) CLOSURE N/A 09/03/2016   Procedure: Patent [place](PFO) Closure;  Surgeon: Yates Decamp, MD;  Location: [facility] INVASIVE CV LAB;  Service: Cardiovascular;  Laterality: N/A;   TEE WITHOUT CARDIOVERSION N/A 02/29/2016   Procedure: TRANSESOPHAGEAL ECHOCARDIOGRAM (TEE);  Surgeon: Yates Decamp, MD;  Location: [facility] ENDOSCOPY;  Service: Cardiovascular;  Laterality: N/A;   TOE SURGERY Right 2020    FAMILY HISTORY: Family History  Problem Relation Age of Onset   Lung disease Neg Hx     SOCIAL HISTORY: Social History   Socioeconomic History   Marital status: Widowed    Spouse name: Not on file   Number of children: 9   Years of education: Not on file   Highest education level: Not on file  Occupational History   Occupation: retired  Tobacco Use   Smoking status: Never   Smokeless tobacco: Never  Vaping Use   Vaping  status: Never Used  Substance and Sexual Activity   Alcohol use: No   Drug use: No   Sexual activity: Not Currently  Other Topics Concern   Not on file  Social History Narrative   From [place].   Social Determinants of Health   Financial Resource Strain: Not on file  Food Insecurity: No Food Insecurity (07/28/2022)   Hunger Vital Sign    Worried About Running Out of Food in the Last Year: Never true    Ran Out of Food in the Last Year: Never true  Transportation Needs: No Transportation Needs (07/28/2022)  PRAPARE - Administrator, Civil Service (Medical): No    Lack of Transportation (Non-Medical): No  Physical Activity: Not on file  Stress: Not on file  Social Connections: Unknown (09/25/2021)   [facility]   Social Network    Social Network: Not on file  Intimate Partner Violence: Not At Risk (07/28/2022)   Humiliation, Afraid, Rape, and Kick questionnaire    Fear of Current or Ex-Partner: No    Emotionally Abused: No    Physically Abused: No    Sexually Abused: No     I spent 25 minutes of face-to-face and non-face-to-face time with patient and daughter.  This included previsit chart review, lab review, study review, order entry, electronic health record documentation, patient and daughter education and discussion regarding above diagnoses and treatment plan and answered all other questions to patient and daughters satisfaction  Ihor Austin, [facility]  [facility] [address] [place], [place] [address]  Phone [phone] Fax [phone] Note: This document was prepared with digital dictation and possible smart phrase technology. Any transcriptional errors that result from this process are unintentional.

## 2023-03-20 NOTE — Patient Instructions (Addendum)
Your Plan:  Start to use amitriptyline 10mg  nightly as this can help with migraine headaches  If headaches persist, can consider increasing dosage to 25mg  nightly  Okay to use Tylenol and Fioricet as needed but please limit use to no more than 5-6x per month as this can cause rebound headaches    Follow up as needed      Thank you for coming to see [place] at [facility]. I hope we have been able to provide you high quality care today.  You may receive a patient satisfaction survey over the next few weeks. We would appreciate your feedback and comments so that we may continue to improve ourselves and the health of our patients.

## 2023-04-03 ENCOUNTER — Ambulatory Visit: Payer: Self-pay | Admitting: Cardiology

## 2023-06-17 ENCOUNTER — Telehealth: Payer: Self-pay | Admitting: *Deleted

## 2023-06-17 ENCOUNTER — Ambulatory Visit: Payer: Medicaid Other | Admitting: Cardiology

## 2023-06-17 NOTE — Telephone Encounter (Signed)
   Pre-operative Risk Assessment    Patient Name: Morgan Bowers  DOB: [birth year] MRN: [id number]   Date of last office visit: 10/02/22 DR. LADONA Date of next office visit: 06/20/23 DR. [facility]   Request for Surgical Clearance    Procedure:  COLONOSCOPY/ENDOSCOPY (H/O POLYPS/BLOOD IN STOOL)  Date of Surgery:  Clearance 07/29/23                                Surgeon:  DR. BURNETTE Surgeon's Group or Practice Name:  [facility] Phone number:  [phone] Fax number:  [phone]   Type of Clearance Requested:   - Medical  - Pharmacy:  Hold Rivaroxaban  (Xarelto ) x 3 DAYS PRIOR   Type of Anesthesia:   PROPOFOL     Additional requests/questions:    Bonney Niels Jest   06/17/2023, 6:10 PM

## 2023-06-18 ENCOUNTER — Ambulatory Visit (INDEPENDENT_AMBULATORY_CARE_PROVIDER_SITE_OTHER): Payer: Medicaid Other | Admitting: Podiatry

## 2023-06-18 ENCOUNTER — Encounter: Payer: Self-pay | Admitting: Podiatry

## 2023-06-18 DIAGNOSIS — D492 Neoplasm of unspecified behavior of bone, soft tissue, and skin: Secondary | ICD-10-CM

## 2023-06-18 NOTE — Progress Notes (Signed)
 Subjective:  Patient ID: Morgan Bowers, female    DOB: Oct 17, 1945,  MRN: [id number]  Chief Complaint  Patient presents with   Ingrown Toenail    RM#11 Ingrown nail of fifth toe of right foot causing pain .    78 y.o. female presents with the above complaint.  Patient presents with right great digit porokeratotic lesion painful again worse worse with ambulation is with pressure patient would like to discuss moving she has not seen any worse but is seeing a pain scale is 5 out of 10 dull aching nature   Review of Systems: Negative except as noted in the HPI. Denies N/V/F/Ch.  Past Medical History:  Diagnosis Date   A-fib (HCC)    Anemia    Arthritis    knees (09/03/2016)   Asthma    GERD (gastroesophageal reflux disease)    Headache    High cholesterol    Hypertension     Current Outpatient Medications:    acetaminophen  (TYLENOL ) 325 MG tablet, Take 650 mg by mouth every 6 (six) hours as needed for moderate pain or headache., Disp: , Rfl:    albuterol  (PROVENTIL ) (2.5 MG/3ML) 0.083% nebulizer solution, Take 2.5 mg by nebulization every 6 (six) hours as needed for wheezing or shortness of breath., Disp: , Rfl:    amitriptyline (ELAVIL) 10 MG tablet, Take 10 mg by mouth at bedtime., Disp: , Rfl:    atorvastatin  (LIPITOR) 20 MG tablet, TAKE 1 TABLET BY MOUTH EVERY DAY, Disp: 90 tablet, Rfl: 2   butalbital-acetaminophen -caffeine (FIORICET) 50-325-40 MG tablet, Take 1 tablet by mouth every 6 (six) hours as needed., Disp: , Rfl:    empagliflozin  (JARDIANCE ) 10 MG TABS tablet, Take 1 tablet (10 mg total) by mouth daily., Disp: 30 tablet, Rfl: 0   Ferric Maltol 30 MG CAPS, Take by mouth., Disp: , Rfl:    ferrous sulfate 325 (65 FE) MG tablet, Take 325 mg by mouth daily., Disp: , Rfl:    fluticasone  furoate-vilanterol (BREO ELLIPTA ) 100-25 MCG/ACT AEPB, Inhale 1 puff into the lungs daily., Disp: 1 each, Rfl: 11   Fluticasone -Umeclidin-Vilant (TRELEGY ELLIPTA) 200-62.5-25 MCG/ACT  AEPB, Inhale into the lungs., Disp: , Rfl:    furosemide  (LASIX ) 40 MG tablet, Take 40 mg by mouth daily as needed for fluid or edema., Disp: , Rfl:    losartan -hydrochlorothiazide (HYZAAR) 50-12.5 MG tablet, TAKE 1 TABLET BY MOUTH EVERY DAY IN THE MORNING, Disp: 90 tablet, Rfl: 3   meclizine  (ANTIVERT ) 25 MG tablet, Take 25 mg by mouth every 8 (eight) hours as needed for dizziness., Disp: , Rfl:    metoprolol  succinate (TOPROL -XL) 50 MG 24 hr tablet, TAKE 1 TABLET BY MOUTH EVERY DAY WITH OR IMMEDIATELY FOLLOWING A MEAL, Disp: 90 tablet, Rfl: 3   MULTIPLE MINERALS PO, Take by mouth., Disp: , Rfl:    potassium chloride  (KLOR-CON ) 10 MEQ tablet, Take 1 tablet (10 mEq total) by mouth daily. (Patient taking differently: Take 10 mEq by mouth daily as needed (With Furosemide  only).), Disp: 90 tablet, Rfl: 3   PROAIR  HFA 108 (90 Base) MCG/ACT inhaler, Inhale 2 puffs into the lungs 4 (four) times daily as needed for shortness of breath or wheezing., Disp: , Rfl: 2   Vitamin D , Ergocalciferol , (DRISDOL ) 50000 units CAPS capsule, Take 50,000 Units by mouth every Wednesday., Disp: , Rfl:    XARELTO  15 MG TABS tablet, TAKE 1 TABLET (15 MG TOTAL) BY MOUTH DAILY WITH SUPPER, Disp: 90 tablet, Rfl: 3  Social History  Tobacco Use  Smoking Status Never  Smokeless Tobacco Never    No Known Allergies Objective:  There were no vitals filed for this visit. There is no height or weight on file to calculate BMI. Constitutional Well developed. Well nourished.  Vascular Dorsalis pedis pulses palpable bilaterally. Posterior tibial pulses palpable bilaterally. Capillary refill normal to all digits.  No cyanosis or clubbing noted. Pedal hair growth normal.  Neurologic Normal speech. Oriented to person, place, and time. Epicritic sensation to light touch grossly present bilaterally.  Dermatologic Right fifth digit porokeratotic lesion painful to touch central nucleated core noted no pinpoint bleeding noted   Orthopedic: Normal joint ROM without pain or crepitus bilaterally. No visible deformities. No bony tenderness.   Radiographs: None Assessment:   1. Skin neoplasm    Plan:  Patient was evaluated and treated and all questions answered.  Right fifth digit neoplasm -All questions and concerns were discussed with the patient extensive detail given the amount of pain that she is unsure benefit from debridement of the lesion using chisel blade handle the lesion was debride down to healthy dry tissue no complication no pinpoint bleeding noted  No follow-ups on file.

## 2023-06-18 NOTE — Telephone Encounter (Signed)
   Name: [name]  DOB: March 26, 1946  MRN: [id number]  Primary Cardiologist: None  Chart reviewed as part of pre-operative protocol coverage. The patient has an upcoming visit scheduled with Dr. Ladona on 06/20/2023 at which time clearance can be addressed in case there are any issues that would impact surgical recommendations.  I added preop FYI to appointment note so that provider is aware to address at time of outpatient visit.  Per office protocol the cardiology provider should forward their finalized clearance decision and recommendations regarding antiplatelet therapy to the requesting party below.    I will route this message as FYI to requesting party and remove this message from the preop box as separate preop APP input not needed at this time.   Please call with any questions.  Wyn Raddle, Jackee Shove, NP  06/18/2023, 7:30 AM

## 2023-06-20 ENCOUNTER — Ambulatory Visit: Payer: Medicaid Other | Attending: Cardiology | Admitting: Cardiology

## 2023-06-20 ENCOUNTER — Encounter: Payer: Self-pay | Admitting: Cardiology

## 2023-06-20 VITALS — BP 128/83 | HR 66 | Resp 16 | Ht 65.0 in | Wt 171.0 lb

## 2023-06-20 DIAGNOSIS — N1831 Chronic kidney disease, stage 3a: Secondary | ICD-10-CM

## 2023-06-20 DIAGNOSIS — I4821 Permanent atrial fibrillation: Secondary | ICD-10-CM

## 2023-06-20 DIAGNOSIS — I5032 Chronic diastolic (congestive) heart failure: Secondary | ICD-10-CM

## 2023-06-20 DIAGNOSIS — I10 Essential (primary) hypertension: Secondary | ICD-10-CM | POA: Diagnosis not present

## 2023-06-20 MED ORDER — HYDRALAZINE HCL 25 MG PO TABS: 25.0000 mg | ORAL_TABLET | Freq: Three times a day (TID) | ORAL | 3 refills | Status: AC

## 2023-06-20 NOTE — Patient Instructions (Signed)
 Medication Instructions:  Please start Hydralazine  25 mg three times a day. Continue all other medications as listed.  *If you need a refill on your cardiac medications before your next appointment, please call your pharmacy*  Follow-Up: At [facility], you and your health needs are our priority.  As part of our continuing mission to provide you with exceptional heart care, we have created designated Provider Care Teams.  These Care Teams include your primary Cardiologist (physician) and Advanced Practice Providers (APPs -  Physician Assistants and Nurse Practitioners) who all work together to provide you with the care you need, when you need it.  We recommend signing up for the patient portal called MyChart.  Sign up information is provided on this After Visit Summary.  MyChart is used to connect with patients for Virtual Visits (Telemedicine).  Patients are able to view lab/test results, encounter notes, upcoming appointments, etc.  Non-urgent messages can be sent to your provider as well.   To learn more about what you can do with MyChart, go to [email or web address].    Your next appointment:   1 year(s)  Provider:   Gordy Bergamo, MD     OK for colonoscopy

## 2023-06-20 NOTE — Progress Notes (Signed)
 Cardiology Office Note:  .   Date:  06/21/2023  ID:  Ted Alveria Seller, DOB 17-Feb-1946, MRN [id number] PCP: Shelda Atlas, MD  [facility] Providers Cardiologist:  Gordy Bergamo, MD   History of Present Illness: .   Morgan Bowers is a 78 y.o.  with h/o mild anemia, negative GI work up in past, needs repeat GI work up and presents for pre-op  evaluation.  She has history of permanent atrial fibrillation, history of PFO closure in 2018, hypertension, stage IIIa chronic kidney disease, hyperlipidemia.  Discussed the use of AI scribe software for clinical note transcription with the patient, who gave verbal consent to proceed.  History of Present Illness   She has a history of atrial fibrillation and heart failure. Her breathing is generally stable, though she sometimes experiences shortness of breath. She has persistent leg swelling, particularly in the left leg, but no new swelling. She takes furosemide  as needed for leg swelling and shortness of breath, losartan  HCT 50/12.5 mg in the morning, and metoprolol  succinate 50 mg daily. Rivaroxaban  is also taken for atrial fibrillation.  States that overall she is feeling well compared to last visit 6 months ago.  She shares a significant family history, noting the recent loss of her grandson due to a cardiac arrest related to a coronary anomaly. This event has caused her considerable emotional distress, impacting her sleep and emotional well-being. She reports crying at night and feeling worried since the loss.  In terms of her social history, she mentions that she had a good holiday season despite the loss of her grandson. She is currently managing her health conditions with the support of her family.   Labs   Lab Results  Component Value Date   CHOL 129 07/24/2022   HDL 69 07/24/2022   LDLCALC 47 07/24/2022   TRIG 52 07/24/2022   CHOLHDL 1.9 07/24/2022   Lab Results  Component Value Date   NA 137 09/17/2022   K 4.0  09/17/2022   CO2 28 09/17/2022   GLUCOSE 68 (L) 09/17/2022   BUN 8 09/17/2022   CREATININE 0.94 09/17/2022   CALCIUM  9.7 09/17/2022   EGFR 62 09/17/2022   GFRNONAA 53 (L) 07/30/2022      Latest Ref Rng & Units 09/17/2022   11:41 AM 08/07/2022   12:23 PM 07/30/2022    2:06 AM  BMP  Glucose 70 - 99 mg/dL 68  78  98   BUN 8 - 27 mg/dL 8  10  18    Creatinine 0.57 - 1.00 mg/dL 9.05  9.07  8.92   BUN/Creat Ratio 12 - 28 9  SEE NOTE:    Sodium 134 - 144 mmol/L 137  137  133   Potassium 3.5 - 5.2 mmol/L 4.0  4.4  4.3   Chloride 96 - 106 mmol/L 95  98  95   CO2 20 - 29 mmol/L 28  34  28   Calcium  8.7 - 10.3 mg/dL 9.7  9.9  9.4       Latest Ref Rng & Units 08/07/2022   12:23 PM 07/29/2022    2:29 AM 07/28/2022    1:21 AM  CBC  WBC 3.8 - 10.8 Thousand/uL 3.1  4.9  9.5   Hemoglobin 11.7 - 15.5 g/dL 88.1  88.0  88.8   Hematocrit 35.0 - 45.0 % 35.2  35.8  34.4   Platelets 140 - 400 Thousand/uL 164  89  94    Review of Systems  Cardiovascular:  Positive for dyspnea on exertion (stable and improved from last visit) and leg swelling (stable ankle swelling). Negative for chest pain.   Physical Exam:   VS:  BP 128/83 (BP Location: Left Arm, Patient Position: Sitting, Cuff Size: Normal)   Pulse 66   Resp 16   Ht 5' 5 (1.651 m)   Wt 171 lb (77.6 kg)   SpO2 97%   BMI 28.46 kg/m    Wt Readings from Last 3 Encounters:  06/20/23 171 lb (77.6 kg)  03/20/23 167 lb (75.8 kg)  11/04/22 163 lb (73.9 kg)    Physical Exam Neck:     Vascular: No carotid bruit or JVD.  Cardiovascular:     Rate and Rhythm: Normal rate. Rhythm irregular.     Pulses: Normal pulses and intact distal pulses.     Heart sounds: No murmur heard. Pulmonary:     Effort: Pulmonary effort is normal.     Breath sounds: Normal breath sounds.  Abdominal:     General: Bowel sounds are normal.     Palpations: Abdomen is soft.  Musculoskeletal:     Right lower leg: Edema (1-2+ pitting ankle edema) present.     Left lower  leg: Edema (1+ ankle edema pitting) present.  Skin:    Capillary Refill: Capillary refill takes less than 2 seconds.    Studies Reviewed: .    Echocardiogram 08/03/2022: Left ventricle cavity is normal in size. Normal left ventricular wall thickness. Normal global wall motion. Normal LV systolic function with EF 55%. Unable to evaluate diastolic function due to atrial fibrillation. Left atrial cavity is severely dilated. Right atrial cavity is moderately dilated. Mild (Grade I) mitral regurgitation. Mild to moderate tricuspid regurgitation. Estimated pulmonary artery systolic pressure 31 mmHg.  EKG:    EKG Interpretation Date/Time:  Friday June 20 2023 11:39:18 EST Ventricular Rate:  66 PR Interval:    QRS Duration:  86 QT Interval:  394 QTC Calculation: 413 R Axis:   15  Text Interpretation: EKG 06/20/2023: Atrial fibrillation with controlled ventricular response at the rate of 66 bpm, no evidence of ischemia.  Compared to 07/27/2022, heart rate was 103 bpm. Confirmed by Rolen Conger, Jagadeesh ([id number]) on 06/20/2023 11:55:37 AM    Medications and allergies    No Known Allergies   Current Outpatient Medications:    acetaminophen  (TYLENOL ) 325 MG tablet, Take 650 mg by mouth every 6 (six) hours as needed for moderate pain or headache., Disp: , Rfl:    amitriptyline (ELAVIL) 10 MG tablet, Take 10 mg by mouth at bedtime., Disp: , Rfl:    atorvastatin  (LIPITOR) 20 MG tablet, TAKE 1 TABLET BY MOUTH EVERY DAY, Disp: 90 tablet, Rfl: 2   butalbital-acetaminophen -caffeine (FIORICET) 50-325-40 MG tablet, Take 1 tablet by mouth every 6 (six) hours as needed., Disp: , Rfl:    empagliflozin  (JARDIANCE ) 10 MG TABS tablet, Take 1 tablet (10 mg total) by mouth daily., Disp: 30 tablet, Rfl: 0   ferrous sulfate 325 (65 FE) MG tablet, Take 325 mg by mouth daily., Disp: , Rfl:    Fluticasone -Umeclidin-Vilant (TRELEGY ELLIPTA) 200-62.5-25 MCG/ACT AEPB, Inhale into the lungs., Disp: , Rfl:    furosemide   (LASIX ) 40 MG tablet, Take 40 mg by mouth daily as needed for fluid or edema., Disp: , Rfl:    hydrALAZINE  (APRESOLINE ) 25 MG tablet, Take 1 tablet (25 mg total) by mouth 3 (three) times daily., Disp: 270 tablet, Rfl: 3   losartan -hydrochlorothiazide (HYZAAR) 50-12.5 MG tablet, TAKE 1 TABLET BY  MOUTH EVERY DAY IN THE MORNING, Disp: 90 tablet, Rfl: 3   meclizine  (ANTIVERT ) 25 MG tablet, Take 25 mg by mouth every 8 (eight) hours as needed for dizziness., Disp: , Rfl:    metoprolol  succinate (TOPROL -XL) 50 MG 24 hr tablet, TAKE 1 TABLET BY MOUTH EVERY DAY WITH OR IMMEDIATELY FOLLOWING A MEAL, Disp: 90 tablet, Rfl: 3   potassium chloride  (KLOR-CON ) 10 MEQ tablet, Take 1 tablet (10 mEq total) by mouth daily. (Patient taking differently: Take 10 mEq by mouth daily as needed (With Furosemide  only).), Disp: 90 tablet, Rfl: 3   Vitamin D , Ergocalciferol , (DRISDOL ) 50000 units CAPS capsule, Take 50,000 Units by mouth every Wednesday., Disp: , Rfl:    XARELTO  15 MG TABS tablet, TAKE 1 TABLET (15 MG TOTAL) BY MOUTH DAILY WITH SUPPER, Disp: 90 tablet, Rfl: 3   ASSESSMENT AND PLAN: .      ICD-10-CM   1. Chronic diastolic (congestive) heart failure (HCC)  I50.32 EKG 12-Lead    2. Permanent atrial fibrillation (HCC)  I48.21     3. Primary hypertension  I10 hydrALAZINE  (APRESOLINE ) 25 MG tablet    4. Stage 3a chronic kidney disease (HCC)  N18.31       Click Here to Calculate/Change CHADS2VASc Score The patient's CHADS2-VASc score is 7, indicating a 11.2% annual risk of stroke.  Therefore, anticoagulation is recommended.   CHF History: Yes HTN History: Yes Diabetes History: No Stroke History: Yes Vascular Disease History: No   Assessment and Plan    Permanent Atrial Fibrillation (AFib) AFib with well-controlled heart rate on metoprolol  succinate 50 mg daily. On rivaroxaban  for anticoagulation to reduce stroke risk (5% per year without treatment). - Continue metoprolol  succinate 50 mg daily -  Continue rivaroxaban  15 mg every evening -Labs reviewed, CBC has remained stable, renal function has remained stable at stage III chronic kidney disease.  Chronic diastolic heart Failure Chronic heart failure with intermittent leg swelling. On furosemide  as needed for fluid overload symptoms (e.g., shortness of breath, >3 lbs weight gain/day). - Continue furosemide  as needed for fluid overload symptoms  Primary hypertension Hypertension with recent elevated readings. On losartan  HCT 50/12.5 mg AM and metoprolol  succinate 50 mg daily. Blood pressure normalized after rest during recent endoscopic consult. Mild stage 3 chronic kidney disease; discussed adding hydralazine  to better control blood pressure without affecting kidney function. - Add hydralazine  25 mg TID - Monitor blood pressure regularly  Chronic Kidney Disease (Stage 3) Well-managed stage 3 chronic kidney disease. Regular monitoring discussed. - Continue current management and monitoring  General Health Maintenance Routine colonoscopy and endoscopy scheduled. Due for routine screening. Discussed holding rivaroxaban  three days before the procedure to reduce bleeding risk. - Send letter to GI confirming she can proceed with colonoscopy and endoscopy - Hold rivaroxaban  three days before the procedure  Follow-up - Follow-up in one year.   Preop evaluation for upcoming endoscopy, low risk, she will hold rivaroxaban  for 3 days prior to the procedure.  S will be sent. Signed,  Gordy Bergamo, MD, [facility] 06/21/2023, 9:16 AM [facility] [address] [place], [place] [address] Phone: [phone]. Fax:  [phone]

## 2023-06-21 ENCOUNTER — Encounter: Payer: Self-pay | Admitting: Cardiology

## 2023-07-20 ENCOUNTER — Encounter: Payer: Self-pay | Admitting: Internal Medicine

## 2023-07-31 ENCOUNTER — Telehealth: Payer: Self-pay | Admitting: Cardiology

## 2023-07-31 MED ORDER — EMPAGLIFLOZIN 10 MG PO TABS: 10.0000 mg | ORAL_TABLET | Freq: Every day | ORAL | 3 refills | Status: AC

## 2023-07-31 NOTE — Telephone Encounter (Signed)
 Pt's medication was sent to pt's pharmacy as requested. Confirmation received.

## 2023-07-31 NOTE — Telephone Encounter (Signed)
*  STAT* If patient is at the pharmacy, call can be transferred to refill team.   1. Which medications need to be refilled? (please list name of each medication and dose if known)   empagliflozin (JARDIANCE) 10 MG TABS tablet     2. Would you like to learn more about the convenience, safety, & potential cost savings by using the [facility]? No     3. Are you open to using the [facility] (Type [facility]. ) No   4. Which pharmacy/location (including street and city if local pharmacy) is medication to be sent to?  [id number] - [place], [place] - [address] Phone: [phone]           5. Do they need a 30 day or 90 day supply? 30 day

## 2023-08-04 ENCOUNTER — Other Ambulatory Visit (HOSPITAL_COMMUNITY): Payer: Self-pay

## 2023-08-04 ENCOUNTER — Telehealth: Payer: Self-pay

## 2023-08-04 NOTE — Telephone Encounter (Signed)
 Pharmacy Patient Advocate Encounter  Received notification from [facility] that Prior Authorization for JARDIANCE has been APPROVED from 08/04/23 to 08/03/24. Ran test claim, Copay is $4. This test claim was processed through [facility]- copay amounts may vary at other pharmacies due to pharmacy/plan contracts, or as the patient moves through the different stages of their insurance plan.

## 2023-08-04 NOTE — Telephone Encounter (Signed)
 Pharmacy Patient Advocate Encounter   Received notification from CoverMyMeds that prior authorization for JARDIANCE is required/requested.   Insurance verification completed.   The patient is insured through [facility] .   Per test claim: PA required; PA submitted to above mentioned insurance via CoverMyMeds Key/confirmation #/EOC [id number] Status is pending

## 2023-08-16 ENCOUNTER — Encounter (HOSPITAL_COMMUNITY): Payer: Self-pay

## 2023-08-16 ENCOUNTER — Emergency Department (HOSPITAL_COMMUNITY)
Admission: EM | Admit: 2023-08-16 | Discharge: 2023-08-16 | Disposition: A | Discharge status: Home / Self Care | Attending: Emergency Medicine | Admitting: Emergency Medicine

## 2023-08-16 ENCOUNTER — Ambulatory Visit (HOSPITAL_COMMUNITY)
Admission: EM | Admit: 2023-08-16 | Discharge: 2023-08-16 | Disposition: A | Discharge status: Other Acute Inpatient Hospital | Attending: Emergency Medicine | Admitting: Emergency Medicine

## 2023-08-16 ENCOUNTER — Emergency Department (HOSPITAL_COMMUNITY)

## 2023-08-16 ENCOUNTER — Other Ambulatory Visit: Payer: Self-pay

## 2023-08-16 DIAGNOSIS — E876 Hypokalemia: Secondary | ICD-10-CM | POA: Insufficient documentation

## 2023-08-16 DIAGNOSIS — I129 Hypertensive chronic kidney disease with stage 1 through stage 4 chronic kidney disease, or unspecified chronic kidney disease: Secondary | ICD-10-CM | POA: Insufficient documentation

## 2023-08-16 DIAGNOSIS — R0789 Other chest pain: Secondary | ICD-10-CM | POA: Insufficient documentation

## 2023-08-16 DIAGNOSIS — Z7901 Long term (current) use of anticoagulants: Secondary | ICD-10-CM | POA: Diagnosis not present

## 2023-08-16 DIAGNOSIS — N189 Chronic kidney disease, unspecified: Secondary | ICD-10-CM | POA: Diagnosis not present

## 2023-08-16 DIAGNOSIS — Z79899 Other long term (current) drug therapy: Secondary | ICD-10-CM | POA: Diagnosis not present

## 2023-08-16 DIAGNOSIS — R079 Chest pain, unspecified: Secondary | ICD-10-CM

## 2023-08-16 LAB — BASIC METABOLIC PANEL WITH GFR
Anion gap: 10 (ref 5–15)
BUN: 10 mg/dL (ref 8–23)
CO2: 31 mmol/L (ref 22–32)
Calcium: 9.9 mg/dL (ref 8.9–10.3)
Chloride: 94 mmol/L — ABNORMAL LOW (ref 98–111)
Creatinine, Ser: 1.01 mg/dL — ABNORMAL HIGH (ref 0.44–1.00)
GFR, Estimated: 57 mL/min — ABNORMAL LOW (ref >60)
Glucose, Bld: 105 mg/dL — ABNORMAL HIGH (ref 70–99)
Potassium: 3.4 mmol/L — ABNORMAL LOW (ref 3.5–5.1)
Sodium: 135 mmol/L (ref 135–145)

## 2023-08-16 LAB — CBC
HCT: 40.3 % (ref 36.0–46.0)
Hemoglobin: 13.3 g/dL (ref 12.0–15.0)
MCH: 32.1 pg (ref 26.0–34.0)
MCHC: 33 g/dL (ref 30.0–36.0)
MCV: 97.3 fL (ref 80.0–100.0)
Platelets: 131 10*3/uL — ABNORMAL LOW (ref 150–400)
RBC: 4.14 MIL/uL (ref 3.87–5.11)
RDW: 12.9 % (ref 11.5–15.5)
WBC: 3.3 10*3/uL — ABNORMAL LOW (ref 4.0–10.5)
nRBC: 0 % (ref 0.0–0.2)

## 2023-08-16 LAB — TROPONIN I (HIGH SENSITIVITY): Troponin I (High Sensitivity): 7 ng/L (ref <18)

## 2023-08-16 MED ORDER — ALUM & MAG HYDROXIDE-SIMETH 200-200-20 MG/5ML PO SUSP
30.0000 mL | Freq: Once | ORAL | Status: AC
  Administered 2023-08-16: 30 mL via ORAL
  Filled 2023-08-16: qty 30

## 2023-08-16 MED ORDER — LIDOCAINE VISCOUS HCL 2 % MT SOLN
15.0000 mL | Freq: Once | OROMUCOSAL | Status: AC
  Administered 2023-08-16: 15 mL via ORAL
  Filled 2023-08-16: qty 15

## 2023-08-16 NOTE — ED Provider Notes (Signed)
 [place] EMERGENCY DEPARTMENT AT [facility] Provider Note   CSN: [id number] Arrival date & time: 08/16/23  1435     History  Chief Complaint  Patient presents with   Chest Pain    Morgan Bowers is a 78 y.o. female with history of hypertension, A-fib, PFO with closure in 2018, CKD, presents with concern for a pain in her left upper chest that has been on and off since last night.  States it came on after eating.  Pain is nonexertional, nonpleuritic.  It does not radiate.  She denies any shortness of breath, cough, fever or chills, or new lower extremity swelling.  Denies any feelings of presyncope. Denies abdominal pain.   Chest Pain      Home Medications Prior to Admission medications   Medication Sig Start Date End Date Taking? Authorizing Provider  acetaminophen (TYLENOL) 325 MG tablet Take 650 mg by mouth every 6 (six) hours as needed for moderate pain or headache.    [provider]  amitriptyline (ELAVIL) 10 MG tablet Take 10 mg by mouth at bedtime.    [provider]  atorvastatin (LIPITOR) 20 MG tablet TAKE 1 TABLET BY MOUTH EVERY DAY 02/03/23   Yates Decamp, MD  butalbital-acetaminophen-caffeine (FIORICET) [phone] MG tablet Take 1 tablet by mouth every 6 (six) hours as needed. 09/24/22   [provider]  empagliflozin (JARDIANCE) 10 MG TABS tablet Take 1 tablet (10 mg total) by mouth daily. 07/31/23   Yates Decamp, MD  ferrous sulfate 325 (65 FE) MG tablet Take 325 mg by mouth daily. 12/24/18   [provider]  Fluticasone-Umeclidin-Vilant (TRELEGY ELLIPTA) 200-62.5-25 MCG/ACT AEPB Inhale into the lungs.    [provider]  furosemide (LASIX) 40 MG tablet Take 40 mg by mouth daily as needed for fluid or edema.    [provider]  hydrALAZINE (APRESOLINE) 25 MG tablet Take 1 tablet (25 mg total) by mouth 3 (three) times daily. 06/20/23 09/18/23  Yates Decamp, MD  losartan-hydrochlorothiazide (HYZAAR) 50-12.5 MG  tablet TAKE 1 TABLET BY MOUTH EVERY DAY IN THE MORNING 10/02/22   Yates Decamp, MD  meclizine (ANTIVERT) 25 MG tablet Take 25 mg by mouth every 8 (eight) hours as needed for dizziness. 06/30/20   [provider]  metoprolol succinate (TOPROL-XL) 50 MG 24 hr tablet TAKE 1 TABLET BY MOUTH EVERY DAY WITH OR IMMEDIATELY FOLLOWING A MEAL 10/09/22   Yates Decamp, MD  potassium chloride (KLOR-CON) 10 MEQ tablet Take 1 tablet (10 mEq total) by mouth daily. Patient taking differently: Take 10 mEq by mouth daily as needed (With Furosemide only). 07/22/22 07/17/23  Yates Decamp, MD  Vitamin D, Ergocalciferol, (DRISDOL) 50000 units CAPS capsule Take 50,000 Units by mouth every Wednesday.    [provider]  XARELTO 15 MG TABS tablet TAKE 1 TABLET (15 MG TOTAL) BY MOUTH DAILY WITH SUPPER 02/03/23   Yates Decamp, MD      Allergies    Patient has no known allergies.    Review of Systems   Review of Systems  Cardiovascular:  Positive for chest pain.    Physical Exam Updated Vital Signs BP (!) 147/91 (BP Location: Right Arm)   Pulse 79   Temp 98.2 F (36.8 C) (Oral)   Resp 16   Ht 5\' 5"  (1.651 m)   Wt 74.8 kg   SpO2 95%   BMI 27.46 kg/m  Physical Exam Vitals and nursing note reviewed.  Constitutional:      General:  She is not in acute distress.    Appearance: She is well-developed. She is not ill-appearing or diaphoretic.  HENT:     Head: Normocephalic and atraumatic.  Eyes:     Conjunctiva/sclera: Conjunctivae normal.  Cardiovascular:     Rate and Rhythm: Normal rate. Rhythm irregular.     Heart sounds: No murmur heard.    Comments: Radial pulse 2+ bilaterally Dorsalis pedis pulse 2+ bilaterally Pulmonary:     Effort: Pulmonary effort is normal. No respiratory distress.     Breath sounds: Normal breath sounds.  Abdominal:     Palpations: Abdomen is soft.     Tenderness: There is no abdominal tenderness.  Musculoskeletal:        General: No swelling.     Cervical back: Neck  supple.     Right lower leg: No edema.     Left lower leg: No edema.  Skin:    General: Skin is warm and dry.     Capillary Refill: Capillary refill takes less than 2 seconds.  Neurological:     Mental Status: She is alert.  Psychiatric:        Mood and Affect: Mood normal.     ED Results / Procedures / Treatments   Labs (all labs ordered are listed, but only abnormal results are displayed) Labs Reviewed  BASIC METABOLIC PANEL WITH GFR - Abnormal; Notable for the following components:      Result Value   Potassium 3.4 (*)    Chloride 94 (*)    Glucose, Bld 105 (*)    Creatinine, Ser 1.01 (*)    GFR, Estimated 57 (*)    All other components within normal limits  CBC - Abnormal; Notable for the following components:   WBC 3.3 (*)    Platelets 131 (*)    All other components within normal limits  TROPONIN I (HIGH SENSITIVITY)    EKG None  Radiology DG Chest 2 View Result Date: 08/16/2023 CLINICAL DATA:  Chest pain. EXAM: CHEST - 2 VIEW COMPARISON:  Chest radiograph dated 07/27/2022. FINDINGS: No focal consolidation, pleural effusion, pneumothorax. Stable cardiac silhouette. Osteopenia with degenerative changes of the spine. Old midthoracic compression fracture and vertebroplasty. No acute osseous pathology. IMPRESSION: No active cardiopulmonary disease. Electronically Signed   By: Elgie Collard M.D.   On: 08/16/2023 15:35    Procedures Procedures    Medications Ordered in ED Medications  alum & mag hydroxide-simeth (MAALOX/MYLANTA) 200-200-20 MG/5ML suspension 30 mL (has no administration in time range)    And  lidocaine (XYLOCAINE) 2 % viscous mouth solution 15 mL (has no administration in time range)    ED Course/ Medical Decision Making/ A&P                                 Medical Decision Making Amount and/or Complexity of Data Reviewed Labs: ordered. Radiology: ordered.     Differential diagnosis includes but is not limited to ACS, arrhythmia, aortic  aneurysm, pericarditis, myocarditis, pericardial effusion, cardiac tamponade, musculoskeletal pain, GERD, Boerhaave's syndrome, DVT/PE, pneumonia, pleural effusion   ED Course:  Upon initial evaluation, patient well-appearing with stable vital signs aside from a slightly elevated blood pressure of 147/91.   Labs Ordered: I Ordered, and personally interpreted labs.  The pertinent results include:   Troponin of 7 BMP with slight hypokalemia at 3.4.  Slight elevation in creatinine at 1.01 CBC with neutropenia, white blood cells at 3.3.  Low platelets at 131. This appears to be at baseline  Imaging Studies ordered: I ordered imaging studies including chest x-ray I independently visualized the imaging with scope of interpretation limited to determining acute life threatening conditions related to emergency care. Imaging showed no acute abnormalities I agree with the radiologist interpretation   Cardiac Monitoring: / EKG: The patient was maintained on a cardiac monitor.  I personally viewed and interpreted the cardiac monitored which showed an underlying rhythm of: A-fib without RVR.  No ST elevations   Upon re-evaluation, patient still well-appearing with stable vitals.   Low concern for ACS at this time given troponin remains stable with troponin of 7 and chest pain has been ongoing for a day now, pain non-exertional, and EKG with no ST changes.  Chest x-ray without any acute abnormality. Low concern for DVT or PE at this time given she denies any shortness of breath, no tachycardia, no recent periods immobilization or surgeries, no history of a blood clot, no lower extremity pain or swelling. Patient given Maalox for pain Case discussed with Dr. Adela Lank who also personally evaluated patient.  He does not feel patient needs repeat troponin.  Feel patient stable and appropriate for discharge home    Impression: Atypical chest pain  Disposition:  The patient was discharged home with  instructions to follow-up with PCP regarding her pain if not improved within the next 2 days. Return precautions given.      This chart was dictated using voice recognition software, Dragon. Despite the best efforts of this provider to proofread and correct errors, errors may still occur which can change documentation meaning.          Final Clinical Impression(s) / ED Diagnoses Final diagnoses:  Atypical chest pain  Hypokalemia    Rx / DC Orders ED Discharge Orders     None         Arabella Merles, PA-C 08/16/23 1633    Melene Plan, DO 08/16/23 1652

## 2023-08-16 NOTE — ED Notes (Signed)
 Patient's daughter to take patient to the ED.

## 2023-08-16 NOTE — Discharge Instructions (Addendum)
 Sent to ED

## 2023-08-16 NOTE — ED Triage Notes (Signed)
 Pt presents with a constant pinching pain to the L chest that started yesterday while sitting. Denies nausea, ShOB, or diaphoresis. The pain went away when she laid down for bed, but it was present when she awoke.

## 2023-08-16 NOTE — Discharge Instructions (Addendum)
 Your workup today is reassuring. It is very unlikely that your pain is due to an issue in your heart.  Your cardiac enzyme (troponin) was normal today. Your EKG which is a measure of the heart's electrical activity and rhythm is normal today. These would both show abnormalities if you were having a heart attack.  Your chest x-ray is normal today.  You were found to have a low potassium level on your labs today. Please increase your dietary intake of calcium with foods such as avocados, potatoes, bananas, spinach, salmon. Please have your PCP monitor this value.    Return to the ER if you have any shortness of breath, difficulty breathing, worsening chest pain, dizziness, jaw pain, left arm or shoulder pain, abdominal pain, unexplained fever, any other new or concerning symptoms.

## 2023-08-16 NOTE — ED Notes (Signed)
 Patient is being discharged from the Urgent Care and sent to the Emergency Department via POV . Per [employer], PA, patient is in need of higher level of care due to chest pain and significant heart history. Patient is aware and verbalizes understanding of plan of care.  Vitals:   08/16/23 1324  BP: (!) 140/87  Pulse: 72  Resp: 16  Temp: (!) 97.5 F (36.4 C)  SpO2: 94%

## 2023-08-16 NOTE — ED Provider Notes (Signed)
 [facility]    CSN: [id number] Arrival date & time: 08/16/23  1115      History   Chief Complaint Chief Complaint  Patient presents with   Chest Pain    HPI Morgan Bowers is a 78 y.o. female.  Here with left sided chest pain that started yesterday at rest Pain is still present today. Reports "very bad" pain. Points to 8 and 10 on the pain faces scale. She is not feeling short of breath. No nausea or abdominal pain. Denies weakness.  History of afib, mitral valve regurg, heart failure, HTN and CKD However reports she does not have any problems with her heart Has a cardiologist, last seen in February   Past Medical History:  Diagnosis Date   A-fib (HCC)    Anemia    Arthritis    "knees" (09/03/2016)   Asthma    GERD (gastroesophageal reflux disease)    Headache    High cholesterol    Hypertension     Patient Active Problem List   Diagnosis Date Noted   Chest pain 07/28/2022   New onset headache 07/28/2022   HLD (hyperlipidemia) 07/28/2022   Acute on chronic heart failure with preserved ejection fraction (HCC) 07/28/2022   Unilateral primary osteoarthritis, right knee 09/22/2019   Unilateral primary osteoarthritis, left knee 09/22/2019   Permanent atrial fibrillation (HCC) 08/20/2019   Nonrheumatic mitral valve regurgitation 11/30/2018   Essential hypertension 11/27/2018   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   Pulmonary nodule 06/17/2016   Anemia 06/17/2016   Thoracic compression fracture (HCC) 06/16/2016   Dyspnea 02/25/2016   Status post patent foramen ovale closure 02/25/2016    Past Surgical History:  Procedure Laterality Date   CARDIOVERSION N/A 08/06/2016   Procedure: CARDIOVERSION;  Surgeon: Yates Decamp, MD;  Location: [facility] ENDOSCOPY;  Service: Cardiovascular;  Laterality: N/A;   CARDIOVERSION N/A 08/03/2019   Procedure: CARDIOVERSION;  Surgeon: Yates Decamp, MD;  Location: [facility] ENDOSCOPY;  Service: Cardiovascular;  Laterality: N/A;   COLONOSCOPY WITH  PROPOFOL N/A 08/13/2017   Procedure: COLONOSCOPY WITH PROPOFOL;  Surgeon: Carman Ching, MD;  Location: [facility] ENDOSCOPY;  Service: Endoscopy;  Laterality: N/A;   ESOPHAGOGASTRODUODENOSCOPY (EGD) WITH PROPOFOL N/A 08/13/2017   Procedure: ESOPHAGOGASTRODUODENOSCOPY (EGD) WITH PROPOFOL;  Surgeon: Carman Ching, MD;  Location: [facility] ENDOSCOPY;  Service: Endoscopy;  Laterality: N/A;   IR GENERIC HISTORICAL  06/19/2016   IR VERTEBROPLASTY CERV/THOR BX INC UNI/BIL INC/INJECT/IMAGING 06/19/2016 Julieanne Cotton, MD [facility]   IR GENERIC HISTORICAL  06/19/2016   IR VERTEBROPLASTY EA ADDL (T&LS) BX INC UNI/BIL INC INJECT/IMAGING 06/19/2016 Julieanne Cotton, MD [facility]   PATENT FORAMEN OVALE CLOSURE  09/03/2016   PATENT FORAMEN OVALE(PFO) CLOSURE N/A 09/03/2016   Procedure: Patent Forament Ovale(PFO) Closure;  Surgeon: Yates Decamp, MD;  [facility];  Service: Cardiovascular;  Laterality: N/A;   TEE WITHOUT CARDIOVERSION N/A 02/29/2016   Procedure: TRANSESOPHAGEAL ECHOCARDIOGRAM (TEE);  Surgeon: Yates Decamp, MD;  Location: [facility] ENDOSCOPY;  Service: Cardiovascular;  Laterality: N/A;   TOE SURGERY Right 2020    OB History   No obstetric history on file.      Home Medications    Prior to Admission medications   Medication Sig Start Date End Date Taking? Authorizing Provider  acetaminophen (TYLENOL) 325 MG tablet Take 650 mg by mouth every 6 (six) hours as needed for moderate pain or headache.    [provider]  amitriptyline (ELAVIL) 10 MG tablet Take 10 mg by mouth at bedtime.    [provider]  atorvastatin (LIPITOR) 20 MG tablet TAKE 1 TABLET BY MOUTH EVERY DAY 02/03/23   Yates Decamp, MD  butalbital-acetaminophen-caffeine (FIORICET) [phone] MG tablet Take 1 tablet by mouth every 6 (six) hours as needed. 09/24/22   [provider]  empagliflozin (JARDIANCE) 10 MG TABS tablet Take 1 tablet (10 mg total) by mouth daily. 07/31/23   Yates Decamp, MD  ferrous sulfate 325  (65 FE) MG tablet Take 325 mg by mouth daily. 12/24/18   [provider]  Fluticasone-Umeclidin-Vilant (TRELEGY ELLIPTA) 200-62.5-25 MCG/ACT AEPB Inhale into the lungs.    [provider]  furosemide (LASIX) 40 MG tablet Take 40 mg by mouth daily as needed for fluid or edema.    [provider]  hydrALAZINE (APRESOLINE) 25 MG tablet Take 1 tablet (25 mg total) by mouth 3 (three) times daily. 06/20/23 09/18/23  Yates Decamp, MD  losartan-hydrochlorothiazide (HYZAAR) 50-12.5 MG tablet TAKE 1 TABLET BY MOUTH EVERY DAY IN THE MORNING 10/02/22   Yates Decamp, MD  meclizine (ANTIVERT) 25 MG tablet Take 25 mg by mouth every 8 (eight) hours as needed for dizziness. 06/30/20   [provider]  metoprolol succinate (TOPROL-XL) 50 MG 24 hr tablet TAKE 1 TABLET BY MOUTH EVERY DAY WITH OR IMMEDIATELY FOLLOWING A MEAL 10/09/22   Yates Decamp, MD  potassium chloride (KLOR-CON) 10 MEQ tablet Take 1 tablet (10 mEq total) by mouth daily. Patient taking differently: Take 10 mEq by mouth daily as needed (With Furosemide only). 07/22/22 07/17/23  Yates Decamp, MD  Vitamin D, Ergocalciferol, (DRISDOL) 50000 units CAPS capsule Take 50,000 Units by mouth every Wednesday.    [provider]  XARELTO 15 MG TABS tablet TAKE 1 TABLET (15 MG TOTAL) BY MOUTH DAILY WITH SUPPER 02/03/23   Yates Decamp, MD    Family History Family History  Problem Relation Age of Onset   Lung disease Neg Hx     Social History Social History   Tobacco Use   Smoking status: Never   Smokeless tobacco: Never  Vaping Use   Vaping status: Never Used  Substance Use Topics   Alcohol use: No   Drug use: No     Allergies   Patient has no known allergies.   Review of Systems Review of Systems  Cardiovascular:  Positive for chest pain.   Per HPI  Physical Exam Triage Vital Signs ED Triage Vitals [08/16/23 1324]  Encounter Vitals Group     BP (!) 140/87     Systolic BP Percentile      Diastolic BP  Percentile      Pulse Rate 72     Resp 16     Temp (!) 97.5 F (36.4 C)     Temp Source Oral     SpO2 94 %     Weight      Height      Head Circumference      Peak Flow      Pain Score 0     Pain Loc      Pain Education      Exclude from Growth Chart    No data found.  Updated Vital Signs BP (!) 140/87 (BP Location: Right Arm)   Pulse 72   Temp (!) 97.5 F (36.4 C) (Oral)   Resp 16   SpO2 94%   Physical Exam Vitals and nursing note reviewed.  Constitutional:      General: She is not in acute distress.    Appearance: She  is not ill-appearing or diaphoretic.  Eyes:     Conjunctiva/sclera: Conjunctivae normal.     Pupils: Pupils are equal, round, and reactive to light.  Cardiovascular:     Rate and Rhythm: Rhythm irregularly irregular.     Pulses:          Radial pulses are 2+ on the right side and 2+ on the left side.  Pulmonary:     Effort: Pulmonary effort is normal.     Breath sounds: Normal breath sounds.  Chest:     Chest wall: No tenderness.  Skin:    General: Skin is warm and dry.  Neurological:     Mental Status: She is alert and oriented to person, place, and time.     Motor: No weakness.     Gait: Gait normal.     Comments: Strength and sensation intact     UC Treatments / Results  Labs (all labs ordered are listed, but only abnormal results are displayed) Labs Reviewed - No data to display  EKG   Radiology No results found.  Procedures Procedures (including critical care time)  Medications Ordered in UC Medications - No data to display  Initial Impression / Assessment and Plan / UC Course  I have reviewed the triage vital signs and the nursing notes.  Pertinent labs & imaging results that were available during my care of the patient were reviewed by me and considered in my medical decision making (see chart for details).  Afebrile, overall stable vitals  EKG atrial fibrillation with ventricular rate of 67 BPM. Similar compared to  reading from February cardiologist visit. Pulses are strong but irregular.   With chest pain, patient age and history, requires evaluation in the emergency department. Higher level of care than available in urgent care setting.   Final Clinical Impressions(s) / UC Diagnoses   Final diagnoses:  Chest pain, unspecified type     Discharge Instructions      Sent to ED     ED Prescriptions   None    PDMP not reviewed this encounter.   Marlow Baars, [place] 08/16/23 1410

## 2023-08-16 NOTE — ED Triage Notes (Addendum)
 Patient states she had left chest pain yesterday while sitting. Patient denies SOB, nausea.  While in triage, patient states she was feeling left chest pain.

## 2023-08-29 ENCOUNTER — Other Ambulatory Visit: Payer: Self-pay | Admitting: Internal Medicine

## 2023-08-29 DIAGNOSIS — Z1231 Encounter for screening mammogram for malignant neoplasm of breast: Secondary | ICD-10-CM

## 2023-10-03 ENCOUNTER — Ambulatory Visit
Admission: RE | Admit: 2023-10-03 | Discharge: 2023-10-03 | Disposition: A | Discharge status: Home / Self Care | Source: Ambulatory Visit | Attending: Internal Medicine | Admitting: Internal Medicine

## 2023-10-03 DIAGNOSIS — Z1231 Encounter for screening mammogram for malignant neoplasm of breast: Secondary | ICD-10-CM

## 2023-11-15 ENCOUNTER — Other Ambulatory Visit: Payer: Self-pay | Admitting: Cardiology

## 2023-12-25 ENCOUNTER — Telehealth: Payer: Self-pay | Admitting: Cardiology

## 2023-12-25 DIAGNOSIS — I5032 Chronic diastolic (congestive) heart failure: Secondary | ICD-10-CM

## 2023-12-25 DIAGNOSIS — I10 Essential (primary) hypertension: Secondary | ICD-10-CM

## 2023-12-25 MED ORDER — LOSARTAN POTASSIUM-HCTZ 50-12.5 MG PO TABS: ORAL_TABLET | ORAL | 1 refills | Status: DC

## 2023-12-25 NOTE — Telephone Encounter (Signed)
 RX sent in

## 2023-12-25 NOTE — Telephone Encounter (Signed)
*  STAT* If patient is at the pharmacy, call can be transferred to refill team.   1. Which medications need to be refilled? (please list name of each medication and dose if known)   losartan-hydrochlorothiazide (HYZAAR) 50-12.5 MG tablet    4. Which pharmacy/location (including street and city if local pharmacy) is medication to be sent to? [id number] - [place], [place] - [address]     5. Do they need a 30 day or 90 day supply? 90

## 2024-01-05 ENCOUNTER — Other Ambulatory Visit: Payer: Self-pay

## 2024-01-05 DIAGNOSIS — I480 Paroxysmal atrial fibrillation: Secondary | ICD-10-CM

## 2024-01-05 MED ORDER — RIVAROXABAN 15 MG PO TABS: 15.0000 mg | ORAL_TABLET | Freq: Every day | ORAL | 3 refills | Status: AC

## 2024-01-05 NOTE — Telephone Encounter (Signed)
 Prescription refill request for Xarelto  received.  Indication:afib Last office visit:2/25 Weight:74.8  kg Age:78 Scr:1.01  4/25 CrCl:54.21  ml/min  Prescription refilled

## 2024-03-08 ENCOUNTER — Ambulatory Visit: Admitting: Orthopedic Surgery

## 2024-03-08 DIAGNOSIS — M65341 Trigger finger, right ring finger: Secondary | ICD-10-CM

## 2024-03-08 MED ORDER — BETAMETHASONE SOD PHOS & ACET 6 (3-3) MG/ML IJ SUSP
6.0000 mg | INTRAMUSCULAR | Status: AC | PRN
  Administered 2024-03-08: 6 mg via INTRA_ARTICULAR

## 2024-03-08 MED ORDER — LIDOCAINE HCL 1 % IJ SOLN
1.0000 mL | INTRAMUSCULAR | Status: AC | PRN
  Administered 2024-03-08: 1 mL

## 2024-03-08 NOTE — Progress Notes (Signed)
 Morgan Bowers - 78 y.o. female MRN [id number]  Date of birth: 04-24-1946  Office Visit Note: Visit Date: 03/08/2024 PCP: Shelda Atlas, MD Referred by: Shelda Atlas, MD  Subjective: No chief complaint on file.  HPI: Morgan Bowers is a pleasant 78 y.o. female who presents today for evaluation of right hand ring finger clicking and locking that been present now for multiple months, worsening in nature.  Has not undergone any prior workup or treatment.  Is having ongoing stiffness as well.  Presents today with her daughter.  She is here today for specific hand surgical evaluation.  Pertinent ROS were reviewed with the patient and found to be negative unless otherwise specified above in HPI.   Visit Reason: right hand Duration of symptoms: years  Hand dominance: right Occupation: retired Diabetic: No Smoking: No Heart/Lung History: none Blood Thinners:  none  Prior Testing/EMG: none Injections (Date): none Treatments: none  Prior Surgery: none    Assessment & Plan: Visit Diagnoses:  1. Trigger finger, right ring finger     Plan: Extensive discussion was had with the patient today regarding her right ring finger trigger digit.  We discussed the etiology and pathophysiology of stenosing tenosynovitis.  We discussed conservative versus surgical treatment modalities.  From a conservative standpoint, we discussed activity modification, splinting, therapy and injections.  From a surgical standpoint, we discussed the possibility for trigger digit release as well as all risk and benefits associated.  Given that she has not trialed conservative treatments, patient is appropriate candidate for cortisone injection to the right ring finger A1 pulley for symptom relief.  Risks and benefit of the cortisone injection were discussed in detail, patient agreed to proceed.  Injection was provided today without issue, patient will return in approximate 6 weeks time for a recheck.   Will also send a referral for occupational therapy for her ongoing right hand stiffness, she can likely transition quickly to home exercise program but I do think she would respond well to some formalized hand therapy.  Follow-up: No follow-ups on file.   Meds & Orders: No orders of the defined types were placed in this encounter.   Orders Placed This Encounter  Procedures   Hand/UE Inj     Procedures: Hand/UE Inj: R ring A1 for trigger finger on 03/08/2024 3:54 PM Indications: pain Details: 25 G needle, volar approach Medications: 1 mL lidocaine  1 %; 6 mg betamethasone acetate-betamethasone sodium phosphate 6 (3-3) MG/ML Outcome: tolerated well, no immediate complications Consent was given by the patient. Patient was prepped and draped in the usual sterile fashion.          Clinical History: No specialty comments available.  She reports that she has never smoked. She has never used smokeless tobacco. No results for input(s): HGBA1C, LABURIC in the last 8760 hours.  Objective:   Vital Signs: There were no vitals taken for this visit.  Physical Exam  Gen: Well-appearing, in no acute distress; non-toxic CV: Regular Rate. Well-perfused. Warm.  Resp: Breathing unlabored on room air; no wheezing. Psych: Fluid speech in conversation; appropriate affect; normal thought process  Ortho Exam Right hand: - Palpable nodule at the A1 pulley of the ring finger, associated tenderness - Notable clicking with deep flexion of the ring finger, there is evidence of significant locking with deep flexion - Sensation intact distally, hand remains warm well-perfused, notable stiffness of all digits diffusely with attempted composite fist, improved passively   Imaging: No results found.  Past Medical/Family/Surgical/Social History:  Medications & Allergies reviewed per EMR, new medications updated. 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   Pulmonary nodule 06/17/2016   Anemia 06/17/2016   Thoracic compression fracture (HCC) 06/16/2016   Dyspnea 02/25/2016   Status post patent foramen ovale closure 02/25/2016   Past Medical History:  Diagnosis Date   A-fib (HCC)    Anemia    Arthritis    knees (09/03/2016)   Asthma    GERD (gastroesophageal reflux disease)    Headache    High cholesterol    Hypertension    Family History  Problem Relation Age of Onset   Lung disease Neg Hx    Past Surgical History:  Procedure Laterality Date   CARDIOVERSION N/A 08/06/2016   Procedure: CARDIOVERSION;  Surgeon: Gordy Bergamo, MD;  Location: [facility] ENDOSCOPY;  Service: Cardiovascular;  Laterality: N/A;   CARDIOVERSION N/A 08/03/2019   Procedure: CARDIOVERSION;  Surgeon: Bergamo Gordy, MD;  Location: [facility] ENDOSCOPY;  Service: Cardiovascular;  Laterality: N/A;   COLONOSCOPY WITH PROPOFOL  N/A 08/13/2017   Procedure: COLONOSCOPY WITH PROPOFOL ;  Surgeon: Celestia Agent, MD;  Location:  [facility] ENDOSCOPY;  Service: Endoscopy;  Laterality: N/A;   ESOPHAGOGASTRODUODENOSCOPY (EGD) WITH PROPOFOL  N/A 08/13/2017   Procedure: ESOPHAGOGASTRODUODENOSCOPY (EGD) WITH PROPOFOL ;  Surgeon: Celestia Agent, MD;  Location: [facility] ENDOSCOPY;  Service: Endoscopy;  Laterality: N/A;   IR GENERIC HISTORICAL  06/19/2016   IR VERTEBROPLASTY CERV/THOR BX INC UNI/BIL INC/INJECT/IMAGING 06/19/2016 Thyra Nash, MD [facility]   IR GENERIC HISTORICAL  06/19/2016   IR VERTEBROPLASTY EA ADDL (T&LS) BX INC UNI/BIL INC INJECT/IMAGING 06/19/2016 Thyra Nash, MD [facility]    PATENT FORAMEN OVALE CLOSURE  09/03/2016   PATENT FORAMEN OVALE(PFO) CLOSURE N/A 09/03/2016   Procedure: Patent Forament [place]vale(PFO) Closure;  Surgeon: Gordy Bergamo, MD;  [facility];  Service: Cardiovascular;  Laterality: N/A;   TEE WITHOUT CARDIOVERSION N/A 02/29/2016   Procedure: TRANSESOPHAGEAL ECHOCARDIOGRAM (TEE);  Surgeon: Gordy Bergamo, MD;  Location: [facility] ENDOSCOPY;  Service: Cardiovascular;  Laterality: N/A;   TOE SURGERY Right 2020   Social History   Occupational History   Occupation: retired  Tobacco Use   Smoking status: Never   Smokeless tobacco: Never  Vaping Use   Vaping status: Never Used  Substance and Sexual Activity   Alcohol use: No   Drug use: No   Sexual activity: Not Currently    Orena Cavazos Afton Alderton, M.D. [facility], Hand Surgery

## 2024-03-15 ENCOUNTER — Encounter: Payer: Self-pay | Admitting: Radiology

## 2024-05-14 ENCOUNTER — Other Ambulatory Visit: Payer: Self-pay | Admitting: Cardiology

## 2024-05-14 DIAGNOSIS — I5032 Chronic diastolic (congestive) heart failure: Secondary | ICD-10-CM

## 2024-05-14 DIAGNOSIS — I10 Essential (primary) hypertension: Secondary | ICD-10-CM

## 2024-05-19 MED ORDER — LOSARTAN POTASSIUM-HCTZ 50-12.5 MG PO TABS: ORAL_TABLET | ORAL | 0 refills | Status: AC

## 2024-05-19 NOTE — Addendum Note (Signed)
 Addended by: DARIO IZETTA CROME on: 05/19/2024 01:21 PM   Modules accepted: Orders

## 2024-06-01 ENCOUNTER — Encounter: Payer: Self-pay | Admitting: Physical Therapy

## 2024-06-01 ENCOUNTER — Ambulatory Visit: Attending: Physician Assistant | Admitting: Physical Therapy

## 2024-06-01 ENCOUNTER — Other Ambulatory Visit: Payer: Self-pay

## 2024-06-01 DIAGNOSIS — G8929 Other chronic pain: Secondary | ICD-10-CM | POA: Diagnosis present

## 2024-06-01 DIAGNOSIS — M25561 Pain in right knee: Secondary | ICD-10-CM | POA: Insufficient documentation

## 2024-06-01 DIAGNOSIS — M5459 Other low back pain: Secondary | ICD-10-CM | POA: Diagnosis present

## 2024-06-01 DIAGNOSIS — M25562 Pain in left knee: Secondary | ICD-10-CM | POA: Diagnosis present

## 2024-06-01 NOTE — Therapy (Signed)
 " OUTPATIENT PHYSICAL THERAPY THORACOLUMBAR EVALUATION   Patient Name: Morgan Bowers MRN: [id number] DOB:Jan 22, 1946, 79 y.o., female Today's Date: 06/01/2024  END OF SESSION:  PT End of Session - 06/01/24 1445     Visit Number 1    Number of Visits 8    Date for Recertification  07/27/24    Authorization Type [employer]    Progress Note Due on Visit 8    PT Start Time 1448    PT Stop Time 1530    PT Time Calculation (min) 42 min    Activity Tolerance Patient tolerated treatment well    Behavior During Therapy WFL for tasks assessed/performed          Past Medical History:  Diagnosis Date   A-fib (HCC)    Anemia    Arthritis    knees (09/03/2016)   Asthma    GERD (gastroesophageal reflux disease)    Headache    High cholesterol    Hypertension    Past Surgical History:  Procedure Laterality Date   CARDIOVERSION N/A 08/06/2016   Procedure: CARDIOVERSION;  Surgeon: Gordy Bergamo, MD;  Location: [facility] ENDOSCOPY;  Service: Cardiovascular;  Laterality: N/A;   CARDIOVERSION N/A 08/03/2019   Procedure: CARDIOVERSION;  Surgeon: Bergamo Gordy, MD;  Location: [facility] ENDOSCOPY;  Service: Cardiovascular;  Laterality: N/A;   COLONOSCOPY WITH PROPOFOL  N/A 08/13/2017   Procedure: COLONOSCOPY WITH PROPOFOL ;  Surgeon: Celestia Agent, MD;  Location: [facility] ENDOSCOPY;  Service: Endoscopy;  Laterality: N/A;   ESOPHAGOGASTRODUODENOSCOPY (EGD) WITH PROPOFOL  N/A 08/13/2017   Procedure: ESOPHAGOGASTRODUODENOSCOPY (EGD) WITH PROPOFOL ;  Surgeon: Celestia Agent, MD;  Location: [facility] ENDOSCOPY;  Service: Endoscopy;  Laterality: N/A;   IR GENERIC HISTORICAL  06/19/2016   IR VERTEBROPLASTY CERV/THOR BX INC UNI/BIL INC/INJECT/IMAGING 06/19/2016 Thyra Nash, MD [facility]INTER[facility] RAD   IR GENERIC HISTORICAL  06/19/2016   IR VERTEBROPLASTY EA ADDL (T&LS) BX INC UNI/BIL INC INJECT/IMAGING 06/19/2016 Thyra Nash, MD [facility]   PATENT FORAMEN OVALE CLOSURE  09/03/2016   PATENT FORAMEN OVALE(PFO) CLOSURE N/A 09/03/2016    Procedure: Patent Forament [place](PFO) Closure;  Surgeon: Gordy Bergamo, MD;  Location: [facility] INVASIVE CV LAB;  Service: Cardiovascular;  Laterality: N/A;   TEE WITHOUT CARDIOVERSION N/A 02/29/2016   Procedure: TRANSESOPHAGEAL ECHOCARDIOGRAM (TEE);  Surgeon: Gordy Bergamo, MD;  Location: [facility] ENDOSCOPY;  Service: Cardiovascular;  Laterality: N/A;   TOE SURGERY Right 2020   Patient Active Problem List   Diagnosis Date Noted   Chest pain 07/28/2022   New onset headache 07/28/2022   HLD (hyperlipidemia) 07/28/2022   Acute on chronic heart failure with preserved ejection fraction (HCC) 07/28/2022   Unilateral primary osteoarthritis, right knee 09/22/2019   Unilateral primary osteoarthritis, left knee 09/22/2019   Permanent atrial fibrillation (HCC) 08/20/2019   Nonrheumatic mitral valve regurgitation 11/30/2018   Essential hypertension 11/27/2018   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 She reports that symptoms are present bilaterally in the posterior trunk in the thoracolumbar region and extend anteriorly just superior iliac crests. Symptoms range in intensity from 0/10-9/10 depending on her activity or position. Pt will take tylenol  to help with the pain. She  states that she is able to complete her activities at home, will have times without symptoms, other times when symptoms are increased, she reports difficulty walking. Symptoms also present with laying down to sleep, will wake her up at times and remains present during the night. Pt endorses bilateral knee pain which is present with stair negotiation.   PERTINENT HISTORY:  Chronic low back pain, episodic in nature, no imaging or injections to date  PAIN:  Are you having pain? Yes: NPRS scale: mild-moderate  Pain location: low back bilaterally Pain description: ache, tender Aggravating factors: laying down Relieving factors: intermittent, variable due to episodic nature  PRECAUTIONS: None  RED FLAGS: None   WEIGHT BEARING RESTRICTIONS: No  FALLS:  Has patient fallen in last 6 months? No  LIVING ENVIRONMENT: Lives with: lives with their family Lives in: House/apartment Stairs: Yes: Internal: flight steps; on right going up Has following equipment at home: None  OCCUPATION: NA  PLOF: Independent  PATIENT GOALS: decrease pain  NEXT MD VISIT: PRN  OBJECTIVE:  Note: Objective measures were completed at Evaluation unless otherwise noted.  DIAGNOSTIC FINDINGS:  None recent for referring dx  PATIENT SURVEYS:  Modified Oswestry:  MODIFIED OSWESTRY DISABILITY SCALE  Date: 06/01/24 Score  Pain intensity 0 = I can tolerate the pain I have without having to use pain medication.  2. Personal care (washing, dressing, etc.) 0 =  I can take care of myself normally without causing increased pain.  3. Lifting 0 = I can lift heavy weights without increased pain.  4. Walking 0 = Pain does not prevent me from walking any distance  5. Sitting 0 =  I can sit in any chair as long as I like.  6. Standing 0 =  I can stand as long as I want without increased pain.  7. Sleeping 1 = I can sleep well only by using pain medication.  8. Social Life 0 = My social life is normal and does not increase  my pain.  9. Traveling 0 =  I can travel anywhere without increased pain.  10. Employment/ Homemaking 0 = My normal homemaking/job activities do not cause pain.  Total 1/50   Interpretation of scores: Score Category Description  0-20% Minimal Disability The patient can cope with most living activities. Usually no treatment is indicated apart from advice on lifting, sitting and exercise  21-40% Moderate Disability The patient experiences more pain and difficulty with sitting, lifting and standing. Travel and social life are more difficult and they may be disabled from work. Personal care, sexual activity and sleeping are not grossly affected, and the patient can usually be managed by conservative means  41-60% Severe Disability Pain remains the main problem in this group, but activities of daily living are affected. These patients require a detailed investigation  61-80% Crippled Back pain impinges on all aspects of the patients life. Positive intervention is required  81-100% Bed-bound These patients are either bed-bound or exaggerating their symptoms  Bluford FORBES Zoe DELENA Karon DELENA, et al. Surgery versus conservative management of stable thoracolumbar fracture: the PRESTO feasibility RCT. [place] ([place]): [employer]; [id number]  Nov. (Health Technology Assessment, No. 25.62.) Appendix 3, Oswestry Disability Index category descriptors. Available from: Findjewelers.cz  Minimally Clinically Important Difference (MCID) = 12.8%  COGNITION: Overall cognitive status: Within functional limits for tasks assessed     SENSATION: WFL   POSTURE: rounded shoulders and forward head  PALPATION: TTP, inc resting tension in bilateral paraspinals, QL  LUMBAR ROM:   AROM eval  Flexion Nil loss, no symptoms  Extension Nil loss, no symptoms  Right lateral flexion Nil loss, stretch on left lateral trunk  Left lateral flexion Nil loss, stretch in R lateral trunk  Right  rotation   Left rotation    (Blank rows = not tested)  LOWER EXTREMITY ROM:     Active  Right eval Left eval  Hip flexion    Hip extension    Hip abduction    Hip adduction    Hip internal rotation    Hip external rotation    Knee flexion 90 85  Knee extension -4 0  Ankle dorsiflexion    Ankle plantarflexion    Ankle inversion    Ankle eversion     (Blank rows = not tested)  LOWER EXTREMITY MMT:    MMT Right eval Left eval  Hip flexion 4-/5 4/5  Hip extension    Hip abduction 4+/5 4+/5  Hip adduction 5/5 5/5  Hip internal rotation    Hip external rotation    Knee flexion 4-/5 4/5  Knee extension 4/5 4/5  Ankle dorsiflexion 5/5 5/5  Ankle plantarflexion 5/5 5/5  Ankle inversion    Ankle eversion     (Blank rows = not tested)   TREATMENT DATE:   06/01/24- EVAL                                                                                                                                  PATIENT EDUCATION:  Education details: Pt educated on relevant anatomy, physiology, pathology, diagnosis, prognosis, progression of care, pain and activity modification related to low back pain Person educated: Patient Education method: Explanation, Demonstration, and Handouts Education comprehension: verbalized understanding and returned demonstration  HOME EXERCISE PROGRAM: Access Code: [email or web address] URL: [email or web address] Date: 06/01/2024 Prepared by: Stann Ohara  Exercises - Seated Lumbar Flexion Stretch  - 2 x daily - 7 x weekly - 1 sets - 15 reps - 2 hold - Supine Lower Trunk Rotation  - 1 x daily - 7 x weekly - 1 sets - 15 reps - 2 hold - Supine Bridge  - 1 x daily - 7 x weekly - 2 sets - 10 reps - 2 hold - Seated Upright Posture Correction  - 1 x daily - 7 x weekly - 1 sets - 25 reps - 2 hold  ASSESSMENT:  CLINICAL IMPRESSION: Patient is a 79 y.o. F who was seen today for physical therapy evaluation and treatment for low back pain. Symptoms are  consistent with increased myofascia restriction and  muscle guarding of the thoracolumbar group, most noted paraspinals and QL, and present with functional deficits consistent with required and desired activities and participations when symptoms are present. Symptoms are episodic in nature and likely accounts for ODI score vs pt subjective reports of inc pain and dec function when symptoms present. Pt able to demonstrate HEP appropriately. Pt requires frequent cues and various approaches to questions and tasks throughout the session. Pt stands to benefit from continued skilled physical therapy to address deficit areas and restore safety with activities and participations at home and in the community.     OBJECTIVE IMPAIRMENTS: decreased activity tolerance, decreased knowledge of condition, increased fascial restrictions, impaired perceived functional ability, increased muscle spasms, and pain.   ACTIVITY LIMITATIONS: sleeping and stairs  PARTICIPATION LIMITATIONS: meal prep, cleaning, laundry, shopping, and community activity  PERSONAL FACTORS: Age, Behavior pattern, Past/current experiences, and Time since onset of injury/illness/exacerbation are also affecting patient's functional outcome.   REHAB POTENTIAL: Good  CLINICAL DECISION MAKING: Stable/uncomplicated  EVALUATION COMPLEXITY: Low   GOALS: Goals reviewed with patient? Yes  SHORT TERM GOALS: Target date: 06/29/24   Pt will report compliance with HEP to work towards ind and home management strategies Baseline: Goal status: INITIAL   2.  Pt will score no greater than 0/50 on ODI to demonstrate improved activity tolerance Baseline:  Goal status: INITIAL     3.  Pt will report improved tolerance to lying down and sleep positions without being kept awake by her symptoms Baseline:  Goal status: INITIAL     LONG TERM GOALS: Target date: 07/27/24     1.  Pt will report no greater than 0/10 pain over 7 consecutive days to  demonstrate maintained reduction in symptoms and improved tolerance to activity Baseline:  Goal status: INITIAL   2.  Pt will be ind in the management of their symptoms at home and in the community Baseline:  Goal status: INITIAL   3.  Pt will demonstrate no TTP in the lumbar spine extending into lower thoracic spine and laterally towards iliac crest to indicate improved muscle tension and response to provided interventions Baseline:  Goal status: INITIAL    PLAN:  PT FREQUENCY: 1x/week  PT DURATION: 8 weeks  PLANNED INTERVENTIONS: 97110-Therapeutic exercises, 97530- Therapeutic activity, [id number]- Neuromuscular re-education, 97535- Self Care, [id number]- Manual therapy, [id number]- Electrical stimulation (unattended), [id number]- Vasopneumatic device, 20560 (1-2 muscles), 20561 (3+ muscles)- Dry Needling, Patient/Family education, Cryotherapy, and Moist heat.  PLAN FOR NEXT SESSION: modify HEP as needed, lower quarter strength, stability, endurance., mobility, motor control through functional movement patterns, address soft tissue deficits, provider discretion   Stann DELENA Ohara, PT 06/01/2024, 4:55 PM  "

## 2024-06-08 ENCOUNTER — Ambulatory Visit: Admitting: Physical Therapy

## 2024-06-15 ENCOUNTER — Ambulatory Visit

## 2024-06-21 ENCOUNTER — Ambulatory Visit (HOSPITAL_COMMUNITY)

## 2024-06-23 ENCOUNTER — Ambulatory Visit

## 2024-06-29 ENCOUNTER — Ambulatory Visit

## 2024-07-23 ENCOUNTER — Ambulatory Visit: Admitting: Cardiology
